# Patient Record
Sex: Female | Born: 1940 | Race: White | Hispanic: No | Marital: Married | Smoking: Former smoker
Health system: Southern US, Community
[De-identification: ages and names within clinical notes are randomized; demographics above are authoritative.]

## PROBLEM LIST (undated history)

## (undated) DIAGNOSIS — K219 Gastro-esophageal reflux disease without esophagitis: Secondary | ICD-10-CM

## (undated) DIAGNOSIS — F419 Anxiety disorder, unspecified: Secondary | ICD-10-CM

## (undated) DIAGNOSIS — F32A Depression, unspecified: Secondary | ICD-10-CM

## (undated) DIAGNOSIS — C55 Malignant neoplasm of uterus, part unspecified: Secondary | ICD-10-CM

## (undated) DIAGNOSIS — T7840XA Allergy, unspecified, initial encounter: Secondary | ICD-10-CM

## (undated) DIAGNOSIS — I350 Nonrheumatic aortic (valve) stenosis: Secondary | ICD-10-CM

## (undated) DIAGNOSIS — I10 Essential (primary) hypertension: Secondary | ICD-10-CM

## (undated) DIAGNOSIS — M75100 Unspecified rotator cuff tear or rupture of unspecified shoulder, not specified as traumatic: Secondary | ICD-10-CM

## (undated) DIAGNOSIS — N952 Postmenopausal atrophic vaginitis: Secondary | ICD-10-CM

## (undated) DIAGNOSIS — F329 Major depressive disorder, single episode, unspecified: Secondary | ICD-10-CM

## (undated) DIAGNOSIS — E785 Hyperlipidemia, unspecified: Secondary | ICD-10-CM

## (undated) DIAGNOSIS — Z9221 Personal history of antineoplastic chemotherapy: Secondary | ICD-10-CM

## (undated) DIAGNOSIS — I340 Nonrheumatic mitral (valve) insufficiency: Secondary | ICD-10-CM

## (undated) DIAGNOSIS — Z923 Personal history of irradiation: Secondary | ICD-10-CM

## (undated) DIAGNOSIS — R011 Cardiac murmur, unspecified: Secondary | ICD-10-CM

## (undated) DIAGNOSIS — R32 Unspecified urinary incontinence: Secondary | ICD-10-CM

## (undated) HISTORY — DX: Unspecified rotator cuff tear or rupture of unspecified shoulder, not specified as traumatic: M75.100

## (undated) HISTORY — DX: Anxiety disorder, unspecified: F41.9

## (undated) HISTORY — DX: Postmenopausal atrophic vaginitis: N95.2

## (undated) HISTORY — PX: TUBAL LIGATION: SHX77

## (undated) HISTORY — DX: Essential (primary) hypertension: I10

## (undated) HISTORY — DX: Unspecified urinary incontinence: R32

## (undated) HISTORY — DX: Allergy, unspecified, initial encounter: T78.40XA

## (undated) HISTORY — DX: Gastro-esophageal reflux disease without esophagitis: K21.9

## (undated) HISTORY — DX: Depression, unspecified: F32.A

## (undated) HISTORY — DX: Major depressive disorder, single episode, unspecified: F32.9

## (undated) HISTORY — DX: Hyperlipidemia, unspecified: E78.5

## (undated) NOTE — *Deleted (*Deleted)
[facility] Emergency Department Provider Note ____________________________________________   First MD Initiated Contact with Patient 10/28/20 1308     (approximate)  I have reviewed the triage vital signs and the nursing notes.   HISTORY  Chief Complaint Laceration  HPI Samantha Lang is a 41 y.o. female with history of *** presents to the emergency department for treatment and evaluation ***.         Past Medical History:  Diagnosis Date  . Allergy   . Anxiety   . Aortic valve stenosis, mild   . Depression    mild  . Diabetes mellitus age 30  . GERD (gastroesophageal reflux disease)   . Heart murmur   . Hemorrhoids   . Hyperlipidemia   . Hypertension 2003  . Incontinence    Female stress  . Mitral incompetence   . Personal history of chemotherapy    3 treatments  . Personal history of radiation therapy    5 treatments  . Postmenopausal atrophic vaginitis   . Uterine cancer [facility])    treated    Patient Active Problem List   Diagnosis Date Noted  . 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 LVH (left ventricular hypertrophy) due to hypertensive disease, without heart failure 04/11/2019  . Foot pain, right 10/10/2018  . Ganglion cyst of left foot 08/29/2018  . Xerosis of skin 08/29/2018  . Palpitations 08/08/2018  . Colon cancer screening   . Trochanteric bursitis of left hip 12/07/2016  . History of endometrial cancer 08/03/2016  . Edema leg 04/13/2016  . Type II diabetes mellitus with complication (HCC) 03/14/2016  . Shoulder pain, left 02/12/2016  . Acid reflux 10/02/2015  . 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 Hyperlipidemia associated with type 2 diabetes mellitus (HCC) 04/08/2015  . Aortic heart valve narrowing 03/26/2015  . Bilateral carotid artery stenosis 03/26/2015  . Anxiety   . Environmental and seasonal allergies   . Postmenopausal atrophic vaginitis   . Depression,  major, recurrent, moderate (HCC)   . 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 (HCC)   . Essential hypertension     Past Surgical History:  Procedure Laterality Date  . ABDOMINAL HYSTERECTOMY  07/2016  . BREAST BIOPSY Right 03/21/08   right, benign   . COLONOSCOPY WITH PROPOFOL N/A 09/22/2017   Procedure: COLONOSCOPY WITH PROPOFOL;  Surgeon: Midge Minium, MD;  Location: [facility];  Service: Gastroenterology;  Laterality: N/A;  diabetic  . EYE SURGERY  Oct 2009   for ptosis,  Dr. Shirlee Limerick, eyelid lift  . POLYPECTOMY  09/22/2017   Procedure: POLYPECTOMY;  Surgeon: Midge Minium, MD;  Location: [facility];  Service: Gastroenterology;;  . TUBAL LIGATION      Prior to Admission medications   Medication Sig Start Date End Date Taking? Authorizing Provider  allopurinol (ZYLOPRIM) 100 MG tablet TAKE 1 TABLET BY MOUTH EVERY DAY 03/06/20   Reubin Milan, MD  amLODipine (NORVASC) 5 MG tablet Take 1 tablet (5 mg total) by mouth 2 (two) times daily. 03/06/20   Reubin Milan, MD  aspirin 81 MG tablet Take 81 mg by mouth daily.    [provider]  atorvastatin (LIPITOR) 10 MG tablet Take 1 tablet by mouth daily. 07/12/15   [provider]  Calcium Carbonate-Vit D-Min (CALCIUM 1200 PO) Take by mouth.    [provider]  calcium-vitamin D (OSCAL WITH D) 500-200 MG-UNIT tablet Take 1 tablet by mouth.  [provider]  carvedilol (COREG) 3.125 MG tablet TAKE 1 TABLET(3.125 MG) BY MOUTH TWICE DAILY WITH MEALS 10/20/16   [provider]  colchicine 0.6 MG tablet Take 0.6 mg by mouth daily. PRN only, takes rarely    [provider]  diazepam (VALIUM) 5 MG tablet Take 0.5 tablets (2.5 mg total) by mouth every 12 (twelve) hours as needed (vertigo). 12/25/18   Reubin Milan, MD  JANUVIA 100 MG tablet TAKE 1 TABLET BY MOUTH EVERY DAY 08/16/20   Reubin Milan, MD  Multiple Vitamin (MULTIVITAMIN) capsule Take 1  capsule by mouth daily.    [provider]  pantoprazole (PROTONIX) 40 MG tablet TAKE 1 TABLET BY MOUTH EVERY DAY 03/06/20   Reubin Milan, MD  psyllium (METAMUCIL SMOOTH TEXTURE) 58.6 % powder Please use one does every other day. 10/17/19   Pasty Spillers, MD  telmisartan-hydrochlorothiazide (MICARDIS HCT) 80-25 MG tablet Take 1 tablet by mouth daily. 06/08/17   [provider]  venlafaxine XR (EFFEXOR-XR) 150 MG 24 hr capsule TAKE (1) CAPSULE BY MOUTH EVERY DAY 07/31/20   Reubin Milan, MD    Allergies Lipitor [atorvastatin calcium], Cephalexin, and Clarithromycin  Family History  Problem Relation Age of Onset  . Breast cancer Paternal Aunt   . Breast cancer Paternal Grandmother 9  . Cancer Father 88       lung  . Stroke Mother   . Hypertension Mother   . Cancer Brother        prostate  . Depression Brother   . Ovarian cancer Neg Hx   . Colon cancer Neg Hx   . Diabetes Neg Hx     Social History Social History   Tobacco Use  . Smoking status: Former Smoker    Packs/day: 0.25    Years: 20.00    Pack years: 5.00    Types: Cigarettes    Quit date: 09/25/1988    Years since quitting: 32.1  . Smokeless tobacco: Never Used  Vaping Use  . Vaping Use: Never used  Substance Use Topics  . Alcohol use: Yes    Alcohol/week: 6.0 standard drinks    Types: 2 Glasses of wine, 2 Cans of beer, 2 Shots of liquor per week  . Drug use: No    Review of Systems  Constitutional: No fever/chills Eyes: No visual changes. ENT: No sore throat. Cardiovascular: Denies chest pain. Respiratory: Denies shortness of breath. Gastrointestinal: No abdominal pain.  No nausea, no vomiting.  No diarrhea.  No constipation. Genitourinary: Negative for dysuria. Musculoskeletal: Negative for back pain. Skin: Negative for rash. Neurological: Negative for headaches, focal weakness or numbness. {**Psychiatric:  Endocrine:  Hematological/Lymphatic:  Allergic/Immunilogical:  **}  ____________________________________________   PHYSICAL EXAM:  VITAL SIGNS: ED Triage Vitals [10/28/20 1304]  Enc Vitals Group     BP (!) 166/60     Pulse Rate 68     Resp 18     Temp 98 F (36.7 C)     Temp Source Oral     SpO2 98 %     Weight 170 lb (77.1 kg)     Height 5\' 2"  (1.575 m)     Head Circumference      Peak Flow      Pain Score 5     Pain Loc      Pain Edu?      Excl. in GC?     Constitutional: Alert and oriented. Well appearing and in no acute distress.  Eyes: Conjunctivae are normal. PERRL. EOMI. Head: Atraumatic. Nose: No congestion/rhinnorhea. Mouth/Throat: Mucous membranes are moist.  Oropharynx non-erythematous. Neck: No stridor.   Hematological/Lymphatic/Immunilogical: No cervical lymphadenopathy. Cardiovascular: Normal rate, regular rhythm. Grossly normal heart sounds.  Good peripheral circulation. Respiratory: Normal respiratory effort.  No retractions. Lungs CTAB. Gastrointestinal: Soft and nontender. No distention. No abdominal bruits. No CVA tenderness. Genitourinary:  Musculoskeletal: No lower extremity tenderness nor edema.  No joint effusions. Neurologic:  Normal speech and language. No gross focal neurologic deficits are appreciated. No gait instability. Skin:  Skin is warm, dry and intact. No rash noted. Psychiatric: Mood and affect are normal. Speech and behavior are normal.  ____________________________________________   LABS (all labs ordered are listed, but only abnormal results are displayed)  Labs Reviewed - No data to display ____________________________________________  EKG  *** ____________________________________________  RADIOLOGY  ED MD interpretation:    *** I, Kem Boroughs, personally viewed and evaluated these images (plain radiographs) as part of my medical decision making, as well as reviewing the written report by the radiologist.  Official radiology report(s): CT Head Wo Contrast  Result Date:  10/28/2020 CLINICAL DATA:  Fall laceration to RIGHT side of forehead EXAM: CT HEAD WITHOUT CONTRAST CT CERVICAL SPINE WITHOUT CONTRAST TECHNIQUE: Multidetector CT imaging of the head and cervical spine was performed following the standard protocol without intravenous contrast. Multiplanar CT image reconstructions of the cervical spine were also generated. COMPARISON:  October 15, 2020 FINDINGS: CT HEAD FINDINGS Brain: No evidence of acute infarction, hemorrhage, hydrocephalus, extra-axial collection or mass lesion/mass effect. Vascular: No hyperdense vessel or unexpected calcification. Skull: Normal. Negative for fracture or focal lesion. Sinuses/Orbits: No acute finding. Other: RIGHT forehead laceration and subcutaneous hematoma. CT CERVICAL SPINE FINDINGS Alignment: Normal. Skull base and vertebrae: No acute fracture. No primary bone lesion or focal pathologic process. Soft tissues and spinal canal: No prevertebral fluid or swelling. No visible canal hematoma. Disc levels: Mild multilevel endplate proliferative changes. Bilateral facet arthropathy. Upper chest: LEFT apical scarring versus nodule measuring up to 4 mm (series 7, image 38. Other: None IMPRESSION: 1. No acute intracranial abnormality. 2. No fracture or static subluxation of the cervical spine. 3. LEFT apical nodule versus scarring measuring up to 4 mm. Recommend follow-up chest CT in 1 year to assess for stability. Electronically Signed   By: Meda Klinefelter MD   On: 10/28/2020 15:18   CT Cervical Spine Wo Contrast  Result Date: 10/28/2020 CLINICAL DATA:  Fall laceration to RIGHT side of forehead EXAM: CT HEAD WITHOUT CONTRAST CT CERVICAL SPINE WITHOUT CONTRAST TECHNIQUE: Multidetector CT imaging of the head and cervical spine was performed following the standard protocol without intravenous contrast. Multiplanar CT image reconstructions of the cervical spine were also generated. COMPARISON:  October 15, 2020 FINDINGS: CT HEAD FINDINGS Brain:  No evidence of acute infarction, hemorrhage, hydrocephalus, extra-axial collection or mass lesion/mass effect. Vascular: No hyperdense vessel or unexpected calcification. Skull: Normal. Negative for fracture or focal lesion. Sinuses/Orbits: No acute finding. Other: RIGHT forehead laceration and subcutaneous hematoma. CT CERVICAL SPINE FINDINGS Alignment: Normal. Skull base and vertebrae: No acute fracture. No primary bone lesion or focal pathologic process. Soft tissues and spinal canal: No prevertebral fluid or swelling. No visible canal hematoma. Disc levels: Mild multilevel endplate proliferative changes. Bilateral facet arthropathy. Upper chest: LEFT apical scarring versus nodule measuring up to 4 mm (series 7, image 38. Other: None IMPRESSION: 1. No acute intracranial abnormality. 2. No fracture or static subluxation of the cervical spine. 3. LEFT  apical nodule versus scarring measuring up to 4 mm. Recommend follow-up chest CT in 1 year to assess for stability. Electronically Signed   By: Meda Klinefelter MD   On: 10/28/2020 15:18   DG Humerus Right  Result Date: 10/28/2020 CLINICAL DATA:  Larey Seat, pain EXAM: RIGHT HUMERUS - 2+ VIEW COMPARISON:  None. FINDINGS: Frontal and lateral views of the right humerus demonstrates no acute fracture. Alignment of the right shoulder and elbow is anatomic. Mild hypertrophic change of the acromioclavicular joint. The soft tissues are unremarkable. IMPRESSION: 1. Mild osteoarthritis of the right shoulder. 2. No acute fracture. Electronically Signed   By: Sharlet Salina M.D.   On: 10/28/2020 15:29    ____________________________________________   PROCEDURES  Procedure(s) performed (including Critical Care):  Procedures  ____________________________________________   INITIAL IMPRESSION / ASSESSMENT AND PLAN     ***  DIFFERENTIAL DIAGNOSIS  ***  ED COURSE  ***    ___________________________________________   FINAL CLINICAL IMPRESSION(S) / ED  DIAGNOSES  Final diagnoses:  Minor head injury, initial encounter  Laceration of forehead, initial encounter  Right arm pain  Incidental pulmonary nodule, > 3mm and < 8mm     ED Discharge Orders    None       Samantha Lang was evaluated in Emergency Department on 10/28/2020 for the symptoms described in the history of present illness. She was evaluated in the context of the global COVID-19 pandemic, which necessitated consideration that the patient might be at risk for infection with the SARS-CoV-2 virus that causes COVID-19. Institutional protocols and algorithms that pertain to the evaluation of patients at risk for COVID-19 are in a state of rapid change based on information released by regulatory bodies including the CDC and federal and state organizations. These policies and algorithms were followed during the patient's care in the ED.   Note:  This document was prepared using Dragon voice recognition software and may include unintentional dictation errors.

---

## 2001-12-26 DIAGNOSIS — I10 Essential (primary) hypertension: Secondary | ICD-10-CM

## 2001-12-26 HISTORY — DX: Essential (primary) hypertension: I10

## 2004-12-26 LAB — HM COLONOSCOPY: HM Colonoscopy: NORMAL

## 2007-12-13 ENCOUNTER — Ambulatory Visit: Payer: Self-pay

## 2008-01-21 ENCOUNTER — Ambulatory Visit: Payer: Self-pay

## 2008-03-13 ENCOUNTER — Ambulatory Visit: Payer: Self-pay | Admitting: Surgery

## 2008-03-13 ENCOUNTER — Other Ambulatory Visit: Payer: Self-pay

## 2008-03-21 ENCOUNTER — Ambulatory Visit: Payer: Self-pay | Admitting: Surgery

## 2008-03-21 HISTORY — PX: BREAST BIOPSY: SHX20

## 2008-09-13 ENCOUNTER — Ambulatory Visit: Payer: Self-pay | Admitting: Family Medicine

## 2008-09-25 HISTORY — PX: EYE SURGERY: SHX253

## 2008-12-26 LAB — HM PAP SMEAR: HM Pap smear: NORMAL

## 2009-01-05 ENCOUNTER — Ambulatory Visit: Payer: Self-pay | Admitting: Internal Medicine

## 2009-02-01 ENCOUNTER — Ambulatory Visit: Payer: Self-pay | Admitting: Family Medicine

## 2009-04-15 ENCOUNTER — Ambulatory Visit: Payer: Self-pay | Admitting: Internal Medicine

## 2009-04-25 LAB — HM MAMMOGRAPHY: HM Mammogram: NORMAL

## 2010-03-26 ENCOUNTER — Ambulatory Visit: Payer: Self-pay | Admitting: Internal Medicine

## 2010-05-19 ENCOUNTER — Ambulatory Visit: Payer: Self-pay | Admitting: Internal Medicine

## 2010-05-25 ENCOUNTER — Ambulatory Visit: Payer: Self-pay | Admitting: Internal Medicine

## 2011-08-24 ENCOUNTER — Other Ambulatory Visit: Payer: Self-pay | Admitting: Internal Medicine

## 2011-08-24 MED ORDER — NEBIVOLOL HCL 10 MG PO TABS: 10.0000 mg | ORAL_TABLET | Freq: Every day | ORAL | Status: DC

## 2011-08-24 MED ORDER — SIMVASTATIN 40 MG PO TABS: 40.0000 mg | ORAL_TABLET | Freq: Every day | ORAL | Status: DC

## 2011-09-05 ENCOUNTER — Ambulatory Visit: Payer: Self-pay | Admitting: Internal Medicine

## 2011-09-13 ENCOUNTER — Ambulatory Visit: Payer: Self-pay | Admitting: Internal Medicine

## 2011-09-22 ENCOUNTER — Ambulatory Visit: Payer: Self-pay | Admitting: Internal Medicine

## 2011-09-26 ENCOUNTER — Ambulatory Visit (INDEPENDENT_AMBULATORY_CARE_PROVIDER_SITE_OTHER): Payer: PRIVATE HEALTH INSURANCE | Admitting: Internal Medicine

## 2011-09-26 ENCOUNTER — Encounter: Payer: Self-pay | Admitting: Internal Medicine

## 2011-09-26 ENCOUNTER — Ambulatory Visit: Payer: Self-pay | Admitting: Internal Medicine

## 2011-09-26 DIAGNOSIS — F3289 Other specified depressive episodes: Secondary | ICD-10-CM

## 2011-09-26 DIAGNOSIS — E118 Type 2 diabetes mellitus with unspecified complications: Secondary | ICD-10-CM

## 2011-09-26 DIAGNOSIS — F32A Depression, unspecified: Secondary | ICD-10-CM

## 2011-09-26 DIAGNOSIS — E785 Hyperlipidemia, unspecified: Secondary | ICD-10-CM

## 2011-09-26 DIAGNOSIS — E1129 Type 2 diabetes mellitus with other diabetic kidney complication: Secondary | ICD-10-CM | POA: Insufficient documentation

## 2011-09-26 DIAGNOSIS — M2550 Pain in unspecified joint: Secondary | ICD-10-CM

## 2011-09-26 DIAGNOSIS — I10 Essential (primary) hypertension: Secondary | ICD-10-CM

## 2011-09-26 DIAGNOSIS — Z79899 Other long term (current) drug therapy: Secondary | ICD-10-CM

## 2011-09-26 DIAGNOSIS — F329 Major depressive disorder, single episode, unspecified: Secondary | ICD-10-CM

## 2011-09-26 DIAGNOSIS — E119 Type 2 diabetes mellitus without complications: Secondary | ICD-10-CM

## 2011-09-26 LAB — COMPREHENSIVE METABOLIC PANEL
ALT: 29 U/L (ref 0–35)
Alkaline Phosphatase: 72 U/L (ref 39–117)
CO2: 28 mEq/L (ref 19–32)
Creatinine, Ser: 0.8 mg/dL (ref 0.4–1.2)
GFR: 81.47 mL/min (ref >60.00)
Total Bilirubin: 0.7 mg/dL (ref 0.3–1.2)

## 2011-09-26 LAB — SEDIMENTATION RATE: Sed Rate: 25 mm/hr — ABNORMAL HIGH (ref 0–22)

## 2011-09-26 LAB — URIC ACID: Uric Acid, Serum: 7.3 mg/dL — ABNORMAL HIGH (ref 2.4–7.0)

## 2011-09-26 LAB — HEMOGLOBIN A1C: Hgb A1c MFr Bld: 6.6 % — ABNORMAL HIGH (ref 4.6–6.5)

## 2011-09-26 MED ORDER — NABUMETONE 750 MG PO TABS: 750.0000 mg | ORAL_TABLET | Freq: Two times a day (BID) | ORAL | Status: AC

## 2011-09-26 MED ORDER — HYDROCORTISONE 2.5 % RE CREA: 1.0000 "application " | TOPICAL_CREAM | Freq: Two times a day (BID) | RECTAL | Status: DC | PRN

## 2011-09-26 NOTE — Patient Instructions (Signed)
The female psychiatrists tha t I recommend are:  Emerson Monte in [employer] in  [place] Artie [place] in [place]  We are trying nabumetone (relafen) as an antiniflammtory for your multiple joint problems and apply ice for 15 minutes twice daily for that middle finger.  If the medication doesn't help with the pain,  Call us back in 1 -2 weeks

## 2011-09-26 NOTE — Progress Notes (Signed)
  Subjective:    Patient ID: Samantha Lang, female    DOB: 08-Aug-1941, 70 y.o.   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  She has multiple joint pain complaints today, is unhappy both at work and at home, which is aggravated by stress of economic situation. Her most obvious joint issue is notable for her right middle finger which is diffusely swollen. No history of trauma, recent use of shears or yardwork. Multiple prior hadn surgeries for trigger finger release.   Past Medical History  Diagnosis Date  . Hemorrhoids   . Incontinence     Female stress  . GERD (gastroesophageal reflux disease)   . Hyperlipidemia   . Hypertension 2003  . Diabetes mellitus age 73  . Anxiety   . Allergy   . Postmenopausal atrophic vaginitis   . Depression     mild    Current Outpatient Prescriptions on File Prior to Visit  Medication Sig Dispense Refill  . nebivolol (BYSTOLIC) 10 MG tablet Take 1 tablet (10 mg total) by mouth daily.  30 tablet  3  . simvastatin (ZOCOR) 40 MG tablet Take 1 tablet (40 mg total) by mouth at bedtime.  30 tablet  3    Review of Systems  Constitutional: Negative for fever, chills and unexpected weight change.  HENT: Negative for hearing loss, ear pain, nosebleeds, congestion, sore throat, facial swelling, rhinorrhea, sneezing, mouth sores, trouble swallowing, neck pain, neck stiffness, voice change, postnasal drip, sinus pressure, tinnitus and ear discharge.   Eyes: Negative for pain, discharge, redness and visual disturbance.  Respiratory: Negative for cough, chest tightness, shortness of breath, wheezing and stridor.   Cardiovascular: Negative for chest pain, palpitations and leg swelling.  Musculoskeletal: Positive for myalgias and arthralgias.  Skin: Negative for color change and rash.  Neurological: Negative for dizziness, weakness, light-headedness and headaches.    Hematological: Negative for adenopathy.  Psychiatric/Behavioral: Positive for sleep disturbance and dysphoric mood. Negative for suicidal ideas. The patient is nervous/anxious.        Objective:   Physical Exam  Musculoskeletal: She exhibits edema.       Arms:         Assessment & Plan:

## 2011-09-27 ENCOUNTER — Encounter: Payer: Self-pay | Admitting: Internal Medicine

## 2011-09-27 DIAGNOSIS — N952 Postmenopausal atrophic vaginitis: Secondary | ICD-10-CM | POA: Insufficient documentation

## 2011-09-27 DIAGNOSIS — F419 Anxiety disorder, unspecified: Secondary | ICD-10-CM | POA: Insufficient documentation

## 2011-09-27 DIAGNOSIS — J3089 Other allergic rhinitis: Secondary | ICD-10-CM | POA: Insufficient documentation

## 2011-09-27 DIAGNOSIS — F331 Major depressive disorder, recurrent, moderate: Secondary | ICD-10-CM | POA: Insufficient documentation

## 2011-09-27 LAB — RHEUMATOID FACTOR: Rhuematoid fact SerPl-aCnc: <10 IU/mL (ref <=14)

## 2011-09-27 LAB — C-REACTIVE PROTEIN: CRP: 0.88 mg/dL — ABNORMAL HIGH (ref <0.60)

## 2011-09-27 NOTE — Assessment & Plan Note (Signed)
Well controlled historically on metformin alone. Repeat labs due.  She takes simvastatin for goal LDL of 70.  Reviewed diet and exercise recommendations.

## 2011-09-27 NOTE — Assessment & Plan Note (Signed)
Her pain complaints seem to be amplified today by her worsening anxiety and depression. She is reluctant to change her dose of effexor or add anything to it.  Spent 10 minutes discussing the stressor in her life which include the upcoming holiday (Christmas)  I have recommended that she resume talk therapy with her former therapist as this has helped her in the past.

## 2011-09-27 NOTE — Assessment & Plan Note (Signed)
With multiple joints involved,  I will rule out  inflammatory and autoimmune etiologies with serologies. Discussed trial of Cymbalta but she does not want to change Effexor.

## 2011-09-28 ENCOUNTER — Telehealth: Payer: Self-pay | Admitting: Internal Medicine

## 2011-09-28 NOTE — Telephone Encounter (Signed)
Pt would like to get lab results for labs she had done on 09/26/11

## 2011-09-29 NOTE — Telephone Encounter (Signed)
Patient notified of lab results

## 2011-10-17 ENCOUNTER — Telehealth: Payer: Self-pay | Admitting: Internal Medicine

## 2011-10-17 DIAGNOSIS — M7989 Other specified soft tissue disorders: Secondary | ICD-10-CM

## 2011-10-17 NOTE — Telephone Encounter (Signed)
I don't knwo what is causing that finger to remain so swollen. We should x ray it to see if there are signs of a fracture .  I will put an order in [facility],

## 2011-10-17 NOTE — Telephone Encounter (Signed)
Pt called  meds are not helping her hands.  Middle finger on right hand is still swollen and painful, but her feet are feeling better not hurting. Pt wanted to know what the next steps for her hands [facility]

## 2011-10-20 ENCOUNTER — Telehealth: Payer: Self-pay | Admitting: *Deleted

## 2011-10-20 NOTE — Telephone Encounter (Signed)
Message copied by Vernie Murders on Thu Oct 20, 2011  4:10 PM ------      Message from: Duncan Dull      Created: Tue Oct 18, 2011  9:06 AM      Regarding: Kisiel labs       Just received labs from employee health dated 10/12/10,  Note the year.  Is this a mistake or are these last years labs.  (i.e., did she go habe labs done last week on the 18th)

## 2011-10-20 NOTE — Telephone Encounter (Signed)
Samantha Lang spoke with pt 10/24

## 2011-10-21 NOTE — Telephone Encounter (Signed)
Left message asking patient to return my call.

## 2011-10-24 NOTE — Telephone Encounter (Signed)
Patient stated she must have sent you the wrong labs.  The date was correct on the labs that were sent.  She recently had a new set of labs drawn and will send those as well.

## 2011-11-10 ENCOUNTER — Telehealth: Payer: Self-pay | Admitting: Internal Medicine

## 2011-11-10 DIAGNOSIS — I10 Essential (primary) hypertension: Secondary | ICD-10-CM

## 2011-11-10 MED ORDER — NEBIVOLOL HCL 20 MG PO TABS: 1.0000 | ORAL_TABLET | Freq: Every day | ORAL | Status: DC

## 2011-11-10 NOTE — Telephone Encounter (Signed)
Sent 20 mg rx to [facility]

## 2011-11-10 NOTE — Telephone Encounter (Signed)
Rx phoned to pharmacy.  

## 2011-11-10 NOTE — Telephone Encounter (Signed)
[phone] Pt called Dr Darrick Huntsman said to increase her bystolic at last visit.  Pt  Takes 2 instead of one  She was taking 10mg  increased to 20mg   Needs another rx [facility] Please advise when call in

## 2011-11-10 NOTE — Telephone Encounter (Signed)
Patient notified of rx

## 2011-11-23 ENCOUNTER — Other Ambulatory Visit: Payer: Self-pay | Admitting: Internal Medicine

## 2011-11-23 DIAGNOSIS — F419 Anxiety disorder, unspecified: Secondary | ICD-10-CM

## 2011-11-23 MED ORDER — DIAZEPAM 5 MG PO TABS: 5.0000 mg | ORAL_TABLET | Freq: Every evening | ORAL | Status: DC | PRN

## 2011-11-23 NOTE — Telephone Encounter (Signed)
Ok to refill the valium as is.

## 2011-11-24 ENCOUNTER — Other Ambulatory Visit: Payer: Self-pay | Admitting: Internal Medicine

## 2011-11-25 MED ORDER — SIMVASTATIN 40 MG PO TABS: 40.0000 mg | ORAL_TABLET | Freq: Every day | ORAL | Status: AC

## 2012-02-03 ENCOUNTER — Other Ambulatory Visit: Payer: Self-pay | Admitting: *Deleted

## 2012-02-03 NOTE — Telephone Encounter (Signed)
Faxed request from [facility], last filled 10/31/11.

## 2012-02-06 ENCOUNTER — Other Ambulatory Visit: Payer: Self-pay | Admitting: *Deleted

## 2012-02-06 MED ORDER — VENLAFAXINE HCL ER 150 MG PO CP24: 150.0000 mg | ORAL_CAPSULE | Freq: Every day | ORAL | Status: DC

## 2012-02-06 NOTE — Telephone Encounter (Signed)
Faxed request from [facility], last filled 10/31/11. 

## 2012-02-10 ENCOUNTER — Other Ambulatory Visit: Payer: PRIVATE HEALTH INSURANCE

## 2012-02-14 ENCOUNTER — Telehealth: Payer: Self-pay | Admitting: Internal Medicine

## 2012-02-14 NOTE — Telephone Encounter (Signed)
Pt called to let you know that she was changing md.  She wanted to go to someone closer to her home with gas prices so high  She will be going to dr berglin in [place]

## 2012-02-15 ENCOUNTER — Ambulatory Visit: Payer: Self-pay | Admitting: Ophthalmology

## 2012-02-17 ENCOUNTER — Ambulatory Visit: Payer: PRIVATE HEALTH INSURANCE | Admitting: Internal Medicine

## 2012-03-28 ENCOUNTER — Encounter: Payer: Self-pay | Admitting: Internal Medicine

## 2012-06-04 ENCOUNTER — Other Ambulatory Visit: Payer: Self-pay | Admitting: Internal Medicine

## 2012-06-04 DIAGNOSIS — I10 Essential (primary) hypertension: Secondary | ICD-10-CM

## 2012-06-04 MED ORDER — NEBIVOLOL HCL 20 MG PO TABS: 1.0000 | ORAL_TABLET | Freq: Every day | ORAL | Status: DC

## 2014-06-04 ENCOUNTER — Ambulatory Visit: Payer: Self-pay | Admitting: Internal Medicine

## 2014-07-11 ENCOUNTER — Ambulatory Visit: Payer: Self-pay | Admitting: Physician Assistant

## 2015-03-25 ENCOUNTER — Ambulatory Visit: Admit: 2015-03-25 | Disposition: A | Discharge status: Home / Self Care | Payer: Self-pay | Admitting: Ophthalmology

## 2015-03-26 DIAGNOSIS — I350 Nonrheumatic aortic (valve) stenosis: Secondary | ICD-10-CM | POA: Insufficient documentation

## 2015-03-26 DIAGNOSIS — I6523 Occlusion and stenosis of bilateral carotid arteries: Secondary | ICD-10-CM | POA: Insufficient documentation

## 2015-03-26 DIAGNOSIS — I358 Other nonrheumatic aortic valve disorders: Secondary | ICD-10-CM | POA: Insufficient documentation

## 2015-04-08 DIAGNOSIS — E785 Hyperlipidemia, unspecified: Secondary | ICD-10-CM

## 2015-04-08 DIAGNOSIS — E1169 Type 2 diabetes mellitus with other specified complication: Secondary | ICD-10-CM | POA: Insufficient documentation

## 2015-04-09 DIAGNOSIS — I071 Rheumatic tricuspid insufficiency: Secondary | ICD-10-CM | POA: Insufficient documentation

## 2015-04-09 DIAGNOSIS — I340 Nonrheumatic mitral (valve) insufficiency: Secondary | ICD-10-CM | POA: Insufficient documentation

## 2015-07-17 ENCOUNTER — Other Ambulatory Visit: Payer: Self-pay | Admitting: Internal Medicine

## 2015-09-08 ENCOUNTER — Ambulatory Visit: Payer: Self-pay | Admitting: Internal Medicine

## 2015-10-02 ENCOUNTER — Other Ambulatory Visit: Payer: Self-pay | Admitting: Internal Medicine

## 2015-10-02 ENCOUNTER — Encounter: Payer: Self-pay | Admitting: Internal Medicine

## 2015-10-02 ENCOUNTER — Ambulatory Visit (INDEPENDENT_AMBULATORY_CARE_PROVIDER_SITE_OTHER): Payer: Medicare Other | Admitting: Internal Medicine

## 2015-10-02 VITALS — BP 148/60 | HR 60 | Ht 62.5 in | Wt 184.4 lb

## 2015-10-02 DIAGNOSIS — I10 Essential (primary) hypertension: Secondary | ICD-10-CM | POA: Diagnosis not present

## 2015-10-02 DIAGNOSIS — Z0000 Encounter for general adult medical examination without abnormal findings: Secondary | ICD-10-CM | POA: Diagnosis not present

## 2015-10-02 DIAGNOSIS — F329 Major depressive disorder, single episode, unspecified: Secondary | ICD-10-CM | POA: Diagnosis not present

## 2015-10-02 DIAGNOSIS — E1169 Type 2 diabetes mellitus with other specified complication: Secondary | ICD-10-CM

## 2015-10-02 DIAGNOSIS — Z1211 Encounter for screening for malignant neoplasm of colon: Secondary | ICD-10-CM

## 2015-10-02 DIAGNOSIS — E119 Type 2 diabetes mellitus without complications: Secondary | ICD-10-CM | POA: Diagnosis not present

## 2015-10-02 DIAGNOSIS — I350 Nonrheumatic aortic (valve) stenosis: Secondary | ICD-10-CM

## 2015-10-02 DIAGNOSIS — K219 Gastro-esophageal reflux disease without esophagitis: Secondary | ICD-10-CM | POA: Insufficient documentation

## 2015-10-02 DIAGNOSIS — E785 Hyperlipidemia, unspecified: Secondary | ICD-10-CM | POA: Diagnosis not present

## 2015-10-02 DIAGNOSIS — Z23 Encounter for immunization: Secondary | ICD-10-CM | POA: Diagnosis not present

## 2015-10-02 DIAGNOSIS — Z1239 Encounter for other screening for malignant neoplasm of breast: Secondary | ICD-10-CM | POA: Diagnosis not present

## 2015-10-02 DIAGNOSIS — F32A Depression, unspecified: Secondary | ICD-10-CM

## 2015-10-02 LAB — POCT URINALYSIS DIPSTICK
BILIRUBIN UA: NEGATIVE
Glucose, UA: NEGATIVE
KETONES UA: NEGATIVE
LEUKOCYTES UA: NEGATIVE
NITRITE UA: NEGATIVE
PH UA: 5
PROTEIN UA: NEGATIVE
RBC UA: NEGATIVE
Spec Grav, UA: 1.015
Urobilinogen, UA: 0.2

## 2015-10-02 NOTE — Progress Notes (Signed)
Patient: Samantha Lang, Female    DOB: 06-14-1941, 74 y.o.   MRN: [id number] Visit Date: 10/02/2015  Today's Provider: Halina Maidens, MD   Chief Complaint  Patient presents with  . Medicare Wellness   Subjective:    Annual wellness visit Samantha Lang is a 74 y.o. female who presents today for her Subsequent Annual Wellness Visit. She feels well. She reports exercising some - walking the dogs. She reports she is sleeping fairly well.   ----------------------------------------------------------- HPI  Hypertension -Patient has good blood pressure control on several medications. Blood pressures normally run in the 140/80 range. She was recently seen by cardiology for mild aortic stenosis as well as TR and MR. Her symptoms been stable without increased shortness of breath on exertion or orthopnea.  Diabetes - patient is on Januvia oral therapy. Metformin was not tolerated due to gastric distress. Patient does not check her blood sugars but she feels as if her blood sugars are stable. Sometimes she may have some mild hypoglycemic symptoms if she skips a meal. She would like to begin checking her blood sugars and will find out what meter is covered.  Depression - patient is doing well on Effexor. She's had no recent change in her mood or sleep patterns. She was a little stressed trying to find a part-time job but decided to relax about that. She denies any suicidal or homicidal thoughts.  Hyperlipidemia - patient. She was intolerant to higher dose of Lipitor. Cardiology recommended that she begin therapy that she is now on 10 mg. She's tolerating it well without myalgias or abdominal pains. She's not had her lipids checked since starting therapy.  Reflux - reflexes chronic and stable. She is maintained on PPI with good symptom relief. No change in bowel habits heartburn or trouble swallowing..  Review of Systems  Constitutional: Negative for fever, chills and fatigue.  HENT: Negative for  hearing loss, sinus pressure and trouble swallowing.   Eyes: Negative for visual disturbance.  Respiratory: Negative for cough, choking, shortness of breath and wheezing.   Cardiovascular: Negative for chest pain, palpitations and leg swelling.  Gastrointestinal: Positive for abdominal distention. Negative for nausea, abdominal pain, constipation and blood in stool.  Endocrine: Negative for polydipsia and polyuria.  Genitourinary: Positive for dyspareunia. Negative for dysuria, hematuria, vaginal bleeding and vaginal discharge.  Musculoskeletal: Negative for joint swelling, gait problem, neck pain and neck stiffness.  Skin: Negative for rash and wound.       Dry skin all over with flaking and itching  Neurological: Negative for light-headedness, numbness and headaches.  Hematological: Negative for adenopathy. Does not bruise/bleed easily.  Psychiatric/Behavioral: Negative for confusion, sleep disturbance and dysphoric mood. The patient is not nervous/anxious.     Social History   Social History  . Marital Status: Married    Spouse Name: N/A  . Number of Children: N/A  . Years of Education: N/A   Occupational History  . Not on file.   Social History Main Topics  . Smoking status: Former Smoker    Quit date: 09/25/1988  . Smokeless tobacco: Never Used  . Alcohol Use: 0.0 oz/week    0 Standard drinks or equivalent per week  . Drug Use: No  . Sexual Activity: Not on file   Other Topics Concern  . Not on file   Social History Narrative    Patient Active Problem List   Diagnosis Date Noted  . Polyarthralgia 09/27/2011  . Anxiety   . Allergy   .  Postmenopausal atrophic vaginitis   . Depression   . 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 Hypertension     Past Surgical History  Procedure Laterality Date  . Breast biopsy  2009    right, benign   . Eye surgery  Oct 2009    for ptosis,  Dr. Rosaria Ferries, eyelid lift    Her family history  includes BRCA 1/2 in her paternal aunt and paternal grandmother; Cancer (age of onset: 58) in her father.    Previous Medications   AMLODIPINE (NORVASC) 5 MG TABLET    Take 5 mg by mouth daily.     ATORVASTATIN (LIPITOR) 10 MG TABLET    Take 1 tablet by mouth daily.   BYSTOLIC 10 MG TABLET    Take 1 tablet by mouth daily.   CALCIUM CARBONATE (OS-CAL) 600 MG TABS    Take 600 mg by mouth 2 (two) times daily with a meal.     CHOLECALCIFEROL (VITAMIN D) 1000 UNITS TABLET    Take 1,000 Units by mouth 2 (two) times daily.     CO-ENZYME Q-10 30 MG CAPSULE    Take 30 mg by mouth 3 (three) times daily.     DIAZEPAM (VALIUM) 5 MG TABLET    Take 1 tablet (5 mg total) by mouth at bedtime as needed.   ESTROGENS, CONJUGATED, (PREMARIN) 0.625 MG TABLET    Take 0.625 mg by mouth daily. Take daily for 21 days then do not take for 7 days.    FISH OIL-OMEGA-3 FATTY ACIDS 1000 MG CAPSULE    Take 2 g by mouth daily.     HYDROCORTISONE (ANUSOL-HC) 2.5 % RECTAL CREAM    Place 1 application rectally 2 (two) times daily as needed for hemorrhoids.   HYDROCORTISONE 0.5 % CREAM    Apply topically as needed.     LOSARTAN-HYDROCHLOROTHIAZIDE (HYZAAR) 100-25 MG PER TABLET    Take 1 tablet by mouth daily.     MAGNESIUM OXIDE (MAG-OX) 400 MG TABLET    Take 400 mg by mouth daily.     METFORMIN (FORTAMET) 1000 MG (OSM) 24 HR TABLET    Take 1,000 mg by mouth daily with breakfast.     MULTIPLE VITAMIN (MULTIVITAMIN) TABLET    Take 1 tablet by mouth daily.     PANTOPRAZOLE (PROTONIX) 40 MG TABLET    Take 40 mg by mouth daily.     SITAGLIPTIN (JANUVIA) 100 MG TABLET    Take 1 tablet by mouth daily at 2 PM daily at 2 PM.   VENLAFAXINE XR (EFFEXOR-XR) 150 MG 24 HR CAPSULE    TAKE ONE CAPSULE BY MOUTH EVERY DAY    Patient Care Team: Glean Hess, MD as PCP - General (Family Medicine)     Objective:   Vitals: BP 148/60 mmHg  Pulse 60  Ht 5' 2.5" (1.588 m)  Wt 184 lb 6.4 oz (83.643 kg)  BMI 33.17 kg/m2  Physical Exam   Constitutional: She is oriented to person, place, and time. She appears well-developed and well-nourished. No distress.  HENT:  Head: Normocephalic and atraumatic.  Right Ear: Tympanic membrane and ear canal normal.  Left Ear: Tympanic membrane and ear canal normal.  Nose: Right sinus exhibits no maxillary sinus tenderness. Left sinus exhibits no maxillary sinus tenderness.  Mouth/Throat: Uvula is midline and oropharynx is clear and moist.  Eyes: Conjunctivae and EOM are normal. Right eye exhibits no discharge. Left eye exhibits no discharge. No scleral icterus.  Neck: Normal range of motion. Neck supple. Carotid bruit is not present. No erythema present. No thyromegaly present.  Cardiovascular: Normal rate, regular rhythm, normal heart sounds and normal pulses.   Pulmonary/Chest: Effort normal and breath sounds normal. No respiratory distress. She has no wheezes. Right breast exhibits no mass, no nipple discharge, no skin change and no tenderness. Left breast exhibits no mass, no nipple discharge, no skin change and no tenderness.  Abdominal: Soft. Bowel sounds are normal. There is no hepatosplenomegaly. There is no tenderness. There is no CVA tenderness.  Musculoskeletal: Normal range of motion.  Lymphadenopathy:    She has no cervical adenopathy.    She has no axillary adenopathy.  Neurological: She is alert and oriented to person, place, and time. She has normal reflexes. No cranial nerve deficit or sensory deficit.  Skin: Skin is warm, dry and intact. No rash noted.  Psychiatric: She has a normal mood and affect. Her speech is normal and behavior is normal. Thought content normal.  Nursing note and vitals reviewed.   Activities of Daily Living In your present state of health, do you have any difficulty performing the following activities: 10/02/2015  Hearing? N  Vision? N  Difficulty concentrating or making decisions? N  Walking or climbing stairs? N  Dressing or bathing? N  Doing  errands, shopping? N    Fall Risk Assessment Fall Risk  10/02/2015  Falls in the past year? No     Patient reports there are safety devices in place in shower at home.   Depression Screen PHQ 2/9 Scores 10/02/2015  PHQ - 2 Score 0    Cognitive Testing - 6-CIT   Correct? Score   What year is it? yes 0 Yes = 0    No = 4  What month is it? yes 0 Yes = 0    No = 3  Remember:     [address]ia [address], [place], [place]     What time is it? yes 0 Yes = 0    No = 3  Count backwards from 20 to 1 yes 0 Correct = 0    1 error = 2   More than 1 error = 4  Say the months of the year in reverse. yes 0 Correct = 0    1 error = 2   More than 1 error = 4  What address did I ask you to remember? yes 0 Correct = 0  1 error = 2    2 error = 4    3 error = 6    4 error = 8    All wrong = 10       TOTAL SCORE  28/28   Interpretation:  Normal  Normal (0-7) Abnormal (8-28)        Assessment & Plan:     Annual Wellness Visit  Reviewed patient's Family Medical History Reviewed and updated list of patient's medical providers Assessment of cognitive impairment was done Assessed patient's functional ability Established a written schedule for health screening [place] Completed and Reviewed  Exercise Activities and Dietary recommendations Goals    None      There is no immunization history for the selected administration types on file for this patient.  Health Maintenance  Topic Date Due  . FOOT EXAM  11/12/1952  . OPHTHALMOLOGY EXAM  11/12/1952  . URINE MICROALBUMIN  11/12/1952  . TETANUS/TDAP  11/12/1961  . ZOSTAVAX  11/12/2002  . DEXA  SCAN  11/13/2007  . PNA vac Low Risk Adult (1 of 2 - PCV13) 11/13/2007  . MAMMOGRAM  04/26/2011  . HEMOGLOBIN A1C  03/26/2012  . COLONOSCOPY  12/26/2014  . INFLUENZA VACCINE  07/27/2015     Discussed health benefits of physical activity, and encouraged her to engage in regular exercise appropriate for her age and condition.     ------------------------------------------------------------------------------------------------------------  1. Medicare annual wellness visit, subsequent Medicare annual wellness measures satisfied - POCT urinalysis dipstick  2. Flu vaccine need - Flu Vaccine QUAD 36+ mos PF IM (Fluarix & Fluzone Quad PF)  3. Essential hypertension Controlled on current regimen - CBC with Differential/Platelet - Comprehensive metabolic panel  4. Well controlled diabetes mellitus ([place]) Continue Januvia alone Patient will call back with covered glucometer for prescription - Hemoglobin A1c - TSH - Microalbumin / creatinine urine ratio  5. Depression Stable on current medication  6. Combined fat and carbohydrate induced hyperlipemia Now on statin therapy and tolerating it well - Lipid panel  7. Gastroesophageal reflux disease, esophagitis presence not specified Continue daily PPI  8. Aortic heart valve narrowing Stable by recent echo  follow-up annually with cardiology  9. Breast cancer screening Continue monthly self exams - MM DIGITAL SCREENING BILATERAL; Future  10. Colon cancer screening Due for a 10 year follow-up - Ambulatory referral to Gastroenterology   Halina Maidens, MD [facility]  10/02/2015

## 2015-10-02 NOTE — Patient Instructions (Signed)
Health Maintenance  Topic Date Due  . FOOT EXAM  11/12/1952  . OPHTHALMOLOGY EXAM  11/12/1952  . URINE MICROALBUMIN  11/12/1952  . TETANUS/TDAP  11/12/1961  . ZOSTAVAX  11/12/2002  . DEXA SCAN  11/13/2007  . PNA vac Low Risk Adult (1 of 2 - PCV13) 11/13/2007  . MAMMOGRAM  04/26/2011  . HEMOGLOBIN A1C  03/26/2012  . COLONOSCOPY  12/26/2014  . INFLUENZA VACCINE  07/27/2015    USE AQUA-GLYCOLIC LOTION OR LAC-HYTRIN LOTION FOR DRY SKIN    Breast Self-Awareness Practicing breast self-awareness may pick up problems early, prevent significant medical complications, and possibly save your life. By practicing breast self-awareness, you can become familiar with how your breasts look and feel and if your breasts are changing. This allows you to notice changes early. It can also offer you some reassurance that your breast health is good. One way to learn what is normal for your breasts and whether your breasts are changing is to do a breast self-exam. If you find a lump or something that was not present in the past, it is best to contact your caregiver right away. Other findings that should be evaluated by your caregiver include nipple discharge, especially if it is bloody; skin changes or reddening; areas where the skin seems to be pulled in (retracted); or new lumps and bumps. Breast pain is seldom associated with cancer (malignancy), but should also be evaluated by a caregiver. HOW TO PERFORM A BREAST SELF-EXAM The best time to examine your breasts is 5-7 days after your menstrual period is over. During menstruation, the breasts are lumpier, and it may be more difficult to pick up changes. If you do not menstruate, have reached menopause, or had your uterus removed (hysterectomy), you should examine your breasts at regular intervals, such as monthly. If you are breastfeeding, examine your breasts after a feeding or after using a breast pump. Breast implants do not decrease the risk for lumps or tumors,  so continue to perform breast self-exams as recommended. Talk to your caregiver about how to determine the difference between the implant and breast tissue. Also, talk about the amount of pressure you should use during the exam. Over time, you will become more familiar with the variations of your breasts and more comfortable with the exam. A breast self-exam requires you to remove all your clothes above the waist. 1. Look at your breasts and nipples. Stand in front of a mirror in a room with good lighting. With your hands on your hips, push your hands firmly downward. Look for a difference in shape, contour, and size from one breast to the other (asymmetry). Asymmetry includes puckers, dips, or bumps. Also, look for skin changes, such as reddened or scaly areas on the breasts. Look for nipple changes, such as discharge, dimpling, repositioning, or redness. 2. Carefully feel your breasts. This is best done either in the shower or tub while using soapy water or when flat on your back. Place the arm (on the side of the breast you are examining) above your head. Use the pads (not the fingertips) of your three middle fingers on your opposite hand to feel your breasts. Start in the underarm area and use  inch (2 cm) overlapping circles to feel your breast. Use 3 different levels of pressure (light, medium, and firm pressure) at each circle before moving to the next circle. The light pressure is needed to feel the tissue closest to the skin. The medium pressure will help to feel  breast tissue a little deeper, while the firm pressure is needed to feel the tissue close to the ribs. Continue the overlapping circles, moving downward over the breast until you feel your ribs below your breast. Then, move one finger-width towards the center of the body. Continue to use the  inch (2 cm) overlapping circles to feel your breast as you move slowly up toward the collar bone (clavicle) near the base of the neck. Continue the up  and down exam using all 3 pressures until you reach the middle of the chest. Do this with each breast, carefully feeling for lumps or changes. 3.  Keep a written record with breast changes or normal findings for each breast. By writing this information down, you do not need to depend only on memory for size, tenderness, or location. Write down where you are in your menstrual cycle, if you are still menstruating. Breast tissue can have some lumps or thick tissue. However, see your caregiver if you find anything that concerns you.  SEEK MEDICAL CARE IF:  You see a change in shape, contour, or size of your breasts or nipples.   You see skin changes, such as reddened or scaly areas on the breasts or nipples.   You have an unusual discharge from your nipples.   You feel a new lump or unusually thick areas.    This information is not intended to replace advice given to you by your health care provider. Make sure you discuss any questions you have with your health care provider.   Document Released: 12/12/2005 Document Revised: 11/28/2012 Document Reviewed: 03/28/2012 Elsevier Interactive Patient Education [employer].

## 2015-10-03 ENCOUNTER — Encounter: Payer: Self-pay | Admitting: Internal Medicine

## 2015-10-03 LAB — COMPREHENSIVE METABOLIC PANEL
ALBUMIN: 4.3 g/dL (ref 3.5–4.8)
ALT: 23 IU/L (ref 0–32)
AST: 19 IU/L (ref 0–40)
Albumin/Globulin Ratio: 1.7 (ref 1.1–2.5)
Alkaline Phosphatase: 75 IU/L (ref 39–117)
BUN / CREAT RATIO: 23 (ref 11–26)
BUN: 18 mg/dL (ref 8–27)
Bilirubin Total: 0.6 mg/dL (ref 0.0–1.2)
CALCIUM: 9.3 mg/dL (ref 8.7–10.3)
CO2: 28 mmol/L (ref 18–29)
CREATININE: 0.77 mg/dL (ref 0.57–1.00)
Chloride: 94 mmol/L — ABNORMAL LOW (ref 97–108)
GFR calc Af Amer: 89 mL/min/{1.73_m2} (ref >59)
GFR, EST NON AFRICAN AMERICAN: 77 mL/min/{1.73_m2} (ref >59)
GLOBULIN, TOTAL: 2.5 g/dL (ref 1.5–4.5)
Glucose: 159 mg/dL — ABNORMAL HIGH (ref 65–99)
Potassium: 4 mmol/L (ref 3.5–5.2)
SODIUM: 138 mmol/L (ref 134–144)
Total Protein: 6.8 g/dL (ref 6.0–8.5)

## 2015-10-03 LAB — CBC WITH DIFFERENTIAL/PLATELET
Basophils Absolute: 0 10*3/uL (ref 0.0–0.2)
Basos: 0 %
EOS (ABSOLUTE): 0.2 10*3/uL (ref 0.0–0.4)
EOS: 3 %
HEMATOCRIT: 36.8 % (ref 34.0–46.6)
HEMOGLOBIN: 12.4 g/dL (ref 11.1–15.9)
IMMATURE GRANULOCYTES: 0 %
Immature Grans (Abs): 0 10*3/uL (ref 0.0–0.1)
Lymphocytes Absolute: 2.2 10*3/uL (ref 0.7–3.1)
Lymphs: 30 %
MCH: 30.4 pg (ref 26.6–33.0)
MCHC: 33.7 g/dL (ref 31.5–35.7)
MCV: 90 fL (ref 79–97)
MONOCYTES: 7 %
Monocytes Absolute: 0.5 10*3/uL (ref 0.1–0.9)
NEUTROS PCT: 60 %
Neutrophils Absolute: 4.2 10*3/uL (ref 1.4–7.0)
Platelets: 229 10*3/uL (ref 150–379)
RBC: 4.08 x10E6/uL (ref 3.77–5.28)
RDW: 13.7 % (ref 12.3–15.4)
WBC: 7.1 10*3/uL (ref 3.4–10.8)

## 2015-10-03 LAB — LIPID PANEL
CHOL/HDL RATIO: 3.7 ratio (ref 0.0–4.4)
Cholesterol, Total: 177 mg/dL (ref 100–199)
HDL: 48 mg/dL (ref >39)
LDL Calculated: 83 mg/dL (ref 0–99)
TRIGLYCERIDES: 229 mg/dL — AB (ref 0–149)
VLDL Cholesterol Cal: 46 mg/dL — ABNORMAL HIGH (ref 5–40)

## 2015-10-03 LAB — HEMOGLOBIN A1C
ESTIMATED AVERAGE GLUCOSE: 154 mg/dL
Hgb A1c MFr Bld: 7 % — ABNORMAL HIGH (ref 4.8–5.6)

## 2015-10-03 LAB — MICROALBUMIN / CREATININE URINE RATIO
CREATININE, UR: 106.1 mg/dL
MICROALB/CREAT RATIO: 36.8 mg/g{creat} — AB (ref 0.0–30.0)
MICROALBUM., U, RANDOM: 39 ug/mL

## 2015-10-03 LAB — TSH: TSH: 2.8 u[IU]/mL (ref 0.450–4.500)

## 2015-11-08 ENCOUNTER — Other Ambulatory Visit: Payer: Self-pay | Admitting: Internal Medicine

## 2015-11-24 ENCOUNTER — Other Ambulatory Visit: Payer: Self-pay | Admitting: Internal Medicine

## 2015-11-24 DIAGNOSIS — F419 Anxiety disorder, unspecified: Secondary | ICD-10-CM

## 2015-11-24 MED ORDER — DIAZEPAM 5 MG PO TABS: 5.0000 mg | ORAL_TABLET | Freq: Every evening | ORAL | Status: DC | PRN

## 2015-12-13 ENCOUNTER — Other Ambulatory Visit: Payer: Self-pay | Admitting: Internal Medicine

## 2016-02-09 ENCOUNTER — Encounter: Payer: Self-pay | Admitting: Internal Medicine

## 2016-02-10 ENCOUNTER — Ambulatory Visit: Payer: Medicare Other | Admitting: Internal Medicine

## 2016-02-12 ENCOUNTER — Encounter: Payer: Self-pay | Admitting: Internal Medicine

## 2016-02-12 ENCOUNTER — Ambulatory Visit (INDEPENDENT_AMBULATORY_CARE_PROVIDER_SITE_OTHER): Payer: Medicare HMO | Admitting: Internal Medicine

## 2016-02-12 VITALS — BP 138/72 | HR 68 | Ht 62.5 in | Wt 188.6 lb

## 2016-02-12 DIAGNOSIS — M25512 Pain in left shoulder: Secondary | ICD-10-CM

## 2016-02-12 DIAGNOSIS — L259 Unspecified contact dermatitis, unspecified cause: Secondary | ICD-10-CM | POA: Diagnosis not present

## 2016-02-12 MED ORDER — TRIAMCINOLONE ACETONIDE 0.1 % EX CREA: 1.0000 "application " | TOPICAL_CREAM | Freq: Two times a day (BID) | CUTANEOUS | Status: DC

## 2016-02-12 NOTE — Progress Notes (Signed)
Date:  02/12/2016   Name:  Samantha Lang   DOB:  1941/01/01   MRN:  [id number]   Chief Complaint: Rash and Arm Pain Rash This is a new problem. The current episode started in the past 7 days. The affected locations include the torso. She was exposed to nothing. Pertinent negatives include no fatigue, fever or shortness of breath. Past treatments include nothing. There is no history of allergies or asthma.  Arm Pain  The incident occurred more than 1 week ago. The pain is present in the left shoulder. The quality of the pain is described as aching. The pain does not radiate. The pain is mild. Pertinent negatives include no chest pain. The symptoms are aggravated by movement and lifting. She has tried acetaminophen for the symptoms.     Review of Systems  Constitutional: Negative for fever and fatigue.  Respiratory: Negative for chest tightness, shortness of breath and wheezing.   Cardiovascular: Negative for chest pain and palpitations.  Musculoskeletal: Positive for myalgias.  Skin: Positive for rash. Negative for color change and wound.  Neurological: Negative for headaches.  Psychiatric/Behavioral: Negative for sleep disturbance and dysphoric mood.    Patient Active Problem List   Diagnosis Date Noted  . Acid reflux 10/02/2015  . 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 Combined fat and carbohydrate induced hyperlipemia 04/08/2015  . Aortic heart valve narrowing 03/26/2015  . Carotid artery narrowing 03/26/2015  . Polyarthralgia 09/27/2011  . Anxiety   . Allergy   . Postmenopausal atrophic vaginitis   . Depression   . 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 Essential hypertension     Prior to Admission medications   Medication Sig Start Date End Date Taking? Authorizing Provider  amLODipine (NORVASC) 5 MG tablet Take 5 mg by mouth daily.     Yes Historical Provider, MD  aspirin 81 MG tablet  Take 81 mg by mouth daily.   Yes Historical Provider, MD  atorvastatin (LIPITOR) 10 MG tablet Take 1 tablet by mouth daily. 07/12/15  Yes Historical Provider, MD  carvedilol (COREG) 3.125 MG tablet Take 1 tablet by mouth 2 (two) times daily. 01/20/16 01/19/17 Yes Historical Provider, MD  diazepam (VALIUM) 5 MG tablet Take 1 tablet (5 mg total) by mouth at bedtime as needed. 11/24/15  Yes Glean Hess, MD  hydrocortisone 0.5 % cream Apply topically as needed.     Yes Historical Provider, MD  JANUVIA 100 MG tablet TAKE 1 TABLET BY MOUTH EVERY DAY 12/14/15  Yes Glean Hess, MD  losartan-hydrochlorothiazide (HYZAAR) 100-25 MG per tablet Take 1 tablet by mouth daily.     Yes Historical Provider, MD  Multiple Vitamin (MULTIVITAMIN) tablet Take 1 tablet by mouth daily.     Yes Historical Provider, MD  venlafaxine XR (EFFEXOR-XR) 150 MG 24 hr capsule TAKE ONE CAPSULE BY MOUTH EVERY DAY 07/17/15  Yes Glean Hess, MD    Allergies  Allergen Reactions  . Amlodipine Swelling    Higher doses caused swelling   . Cephalexin   . Clarithromycin   . Lipitor [Atorvastatin Calcium]     Past Surgical History  Procedure Laterality Date  . Breast biopsy  2009    right, benign   . Eye surgery  Oct 2009    for ptosis,  Dr. Rosaria Ferries, eyelid lift    Social History  Substance Use Topics  . Smoking status: Former Smoker  Quit date: 09/25/1988  . Smokeless tobacco: Never Used  . Alcohol Use: 0.0 oz/week    0 Standard drinks or equivalent per week     Medication list has been reviewed and updated.   Physical Exam  Constitutional: She is oriented to person, place, and time. Vital signs are normal. She appears well-developed. No distress.  HENT:  Head: Normocephalic and atraumatic.  Cardiovascular: Normal rate, regular rhythm and S1 normal.   Pulmonary/Chest: Effort normal and breath sounds normal. No respiratory distress. She has no wheezes.  Musculoskeletal: Normal range of motion.        Left shoulder: She exhibits normal range of motion, no tenderness, no bony tenderness, no swelling and no effusion.  Neurological: She is alert and oriented to person, place, and time. She has normal strength. No sensory deficit.  Skin: Skin is warm and dry. Rash noted. Rash is macular.     Psychiatric: She has a normal mood and affect. Her behavior is normal. Thought content normal.    BP 138/72 mmHg  Pulse 68  Ht 5' 2.5" (1.588 m)  Wt 188 lb 9.6 oz (85.548 kg)  BMI 33.92 kg/m2  Assessment and Plan: 1. Contact dermatitis Use topical cream as needed - triamcinolone cream (KENALOG) 0.1 %; Apply 1 application topically 2 (two) times daily.  Dispense: 30 g; Refill: 1  2. Shoulder pain, left Strain - does not seem to be rotator cuff Use Voltaren gel as needed   Halina Maidens, MD [facility]  02/12/2016

## 2016-02-17 ENCOUNTER — Ambulatory Visit: Payer: PRIVATE HEALTH INSURANCE

## 2016-02-22 ENCOUNTER — Ambulatory Visit
Admission: RE | Admit: 2016-02-22 | Discharge: 2016-02-22 | Disposition: A | Discharge status: Home / Self Care | Payer: Medicare HMO | Source: Ambulatory Visit | Attending: Internal Medicine | Admitting: Internal Medicine

## 2016-02-22 DIAGNOSIS — Z1231 Encounter for screening mammogram for malignant neoplasm of breast: Secondary | ICD-10-CM | POA: Diagnosis not present

## 2016-02-22 DIAGNOSIS — Z1239 Encounter for other screening for malignant neoplasm of breast: Secondary | ICD-10-CM

## 2016-02-23 ENCOUNTER — Other Ambulatory Visit: Payer: Self-pay | Admitting: Internal Medicine

## 2016-02-23 DIAGNOSIS — R928 Other abnormal and inconclusive findings on diagnostic imaging of breast: Secondary | ICD-10-CM

## 2016-02-25 DIAGNOSIS — L02425 Furuncle of right lower limb: Secondary | ICD-10-CM | POA: Diagnosis not present

## 2016-02-25 DIAGNOSIS — L309 Dermatitis, unspecified: Secondary | ICD-10-CM | POA: Diagnosis not present

## 2016-02-25 DIAGNOSIS — D229 Melanocytic nevi, unspecified: Secondary | ICD-10-CM | POA: Diagnosis not present

## 2016-02-25 DIAGNOSIS — Z1283 Encounter for screening for malignant neoplasm of skin: Secondary | ICD-10-CM | POA: Diagnosis not present

## 2016-02-25 DIAGNOSIS — L02426 Furuncle of left lower limb: Secondary | ICD-10-CM | POA: Diagnosis not present

## 2016-02-25 DIAGNOSIS — Z872 Personal history of diseases of the skin and subcutaneous tissue: Secondary | ICD-10-CM | POA: Diagnosis not present

## 2016-03-03 ENCOUNTER — Ambulatory Visit
Admission: RE | Admit: 2016-03-03 | Discharge: 2016-03-03 | Disposition: A | Discharge status: Home / Self Care | Payer: Medicare HMO | Source: Ambulatory Visit | Attending: Internal Medicine | Admitting: Internal Medicine

## 2016-03-03 DIAGNOSIS — R928 Other abnormal and inconclusive findings on diagnostic imaging of breast: Secondary | ICD-10-CM | POA: Insufficient documentation

## 2016-03-10 DIAGNOSIS — E119 Type 2 diabetes mellitus without complications: Secondary | ICD-10-CM | POA: Diagnosis not present

## 2016-03-11 ENCOUNTER — Encounter: Payer: Self-pay | Admitting: Internal Medicine

## 2016-03-14 ENCOUNTER — Encounter: Payer: Self-pay | Admitting: Internal Medicine

## 2016-03-14 ENCOUNTER — Ambulatory Visit (INDEPENDENT_AMBULATORY_CARE_PROVIDER_SITE_OTHER): Payer: Medicare HMO | Admitting: Internal Medicine

## 2016-03-14 VITALS — BP 142/64 | HR 76 | Ht 62.5 in | Wt 186.0 lb

## 2016-03-14 DIAGNOSIS — E1169 Type 2 diabetes mellitus with other specified complication: Secondary | ICD-10-CM

## 2016-03-14 DIAGNOSIS — I10 Essential (primary) hypertension: Secondary | ICD-10-CM

## 2016-03-14 DIAGNOSIS — E785 Hyperlipidemia, unspecified: Secondary | ICD-10-CM

## 2016-03-14 DIAGNOSIS — E119 Type 2 diabetes mellitus without complications: Secondary | ICD-10-CM | POA: Diagnosis not present

## 2016-03-14 DIAGNOSIS — R21 Rash and other nonspecific skin eruption: Secondary | ICD-10-CM

## 2016-03-14 DIAGNOSIS — E118 Type 2 diabetes mellitus with unspecified complications: Secondary | ICD-10-CM | POA: Insufficient documentation

## 2016-03-14 NOTE — Progress Notes (Signed)
Date:  03/14/2016   Name:  Samantha Lang   DOB:  Apr 13, 1941   MRN:  [id number]   Chief Complaint: Follow-up; Diabetes; and Dermatitis Diabetes She presents for her follow-up diabetic visit. She has type 2 diabetes mellitus. Her disease course has been stable. Pertinent negatives for hypoglycemia include no dizziness, headaches, pallor or tremors. Pertinent negatives for diabetes include no blurred vision, no chest pain, no fatigue, no foot paresthesias, no polydipsia and no polyuria. There are no hypoglycemic complications. Symptoms are stable. Current diabetic treatment includes oral agent (monotherapy). She is compliant with treatment most of the time. She monitors urine at home <1 x per month.  Hypertension This is a chronic problem. The current episode started more than 1 year ago. The problem is unchanged. The problem is controlled. Pertinent negatives include no blurred vision, chest pain, headaches, palpitations or shortness of breath.  Rash This is a chronic problem. The problem has been waxing and waning since onset. The affected locations include the abdomen. Pertinent negatives include no cough, fatigue, fever or shortness of breath. Past treatments include topical steroids (seen by Dermatology - no cause found).    Lab Results  Component Value Date   HGBA1C 7.0* 10/02/2015     Review of Systems  Constitutional: Negative for fever, chills, appetite change, fatigue and unexpected weight change.  HENT: Negative for tinnitus and trouble swallowing.   Eyes: Negative for blurred vision and visual disturbance.  Respiratory: Negative for cough, chest tightness and shortness of breath.   Cardiovascular: Negative for chest pain, palpitations and leg swelling.  Gastrointestinal: Negative for abdominal pain.  Endocrine: Negative for polydipsia and polyuria.  Genitourinary: Negative for dysuria, hematuria and difficulty urinating.  Musculoskeletal: Negative for arthralgias.  Skin:  Positive for rash. Negative for pallor and wound.  Neurological: Negative for dizziness, tremors, numbness and headaches.  Psychiatric/Behavioral: Negative for sleep disturbance and dysphoric mood.    Patient Active Problem List   Diagnosis Date Noted  . Shoulder pain, left 02/12/2016  . Acid reflux 10/02/2015  . 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 Hyperlipidemia associated with type 2 diabetes mellitus ([place]) 04/08/2015  . Aortic heart valve narrowing 03/26/2015  . Carotid artery narrowing 03/26/2015  . Polyarthralgia 09/27/2011  . Anxiety   . Environmental and seasonal allergies   . Postmenopausal atrophic vaginitis   . Depression   . 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 Essential hypertension     Prior to Admission medications   Medication Sig Start Date End Date Taking? Authorizing Provider  amLODipine (NORVASC) 5 MG tablet Take 5 mg by mouth daily.     Yes Historical Provider, MD  aspirin 81 MG tablet Take 81 mg by mouth daily.   Yes Historical Provider, MD  atorvastatin (LIPITOR) 10 MG tablet Take 1 tablet by mouth daily. 07/12/15  Yes Historical Provider, MD  carvedilol (COREG) 3.125 MG tablet Take 1 tablet by mouth 2 (two) times daily. 01/20/16 01/19/17 Yes Historical Provider, MD  clobetasol cream (TEMOVATE) 0.05 % APR BID UP TO 2 WKS PER MONTH PRN 02/25/16  Yes Historical Provider, MD  diazepam (VALIUM) 5 MG tablet Take 1 tablet (5 mg total) by mouth at bedtime as needed. 11/24/15  Yes Glean Hess, MD  hydrocortisone 0.5 % cream Apply topically as needed.     Yes Historical Provider, MD  JANUVIA 100 MG tablet TAKE 1 TABLET BY MOUTH EVERY DAY 12/14/15  Yes Glean Hess, MD  losartan-hydrochlorothiazide (HYZAAR) 100-25 MG per tablet Take 1 tablet by mouth daily.     Yes Historical Provider, MD  Multiple Vitamin (MULTIVITAMIN) tablet Take 1 tablet by mouth daily.     Yes Historical Provider,  MD  triamcinolone cream (KENALOG) 0.1 % Apply 1 application topically 2 (two) times daily. 02/12/16  Yes Glean Hess, MD  venlafaxine XR (EFFEXOR-XR) 150 MG 24 hr capsule TAKE ONE CAPSULE BY MOUTH EVERY DAY 07/17/15  Yes Glean Hess, MD    Allergies  Allergen Reactions  . Amlodipine Swelling    Higher doses caused swelling   . Cephalexin   . Clarithromycin   . Lipitor [Atorvastatin Calcium]     Past Surgical History  Procedure Laterality Date  . Eye surgery  Oct 2009    for ptosis,  Dr. Rosaria Ferries, eyelid lift  . Breast biopsy Right 03/21/08    right, benign     Social History  Substance Use Topics  . Smoking status: Former Smoker    Quit date: 09/25/1988  . Smokeless tobacco: Never Used  . Alcohol Use: 0.0 oz/week    0 Standard drinks or equivalent per week     Medication list has been reviewed and updated.   Physical Exam  Constitutional: She is oriented to person, place, and time. She appears well-developed. No distress.  HENT:  Head: Normocephalic and atraumatic.  Cardiovascular: Normal rate, regular rhythm and normal heart sounds.   Pulmonary/Chest: Effort normal and breath sounds normal. No respiratory distress.  Musculoskeletal: Normal range of motion.  Neurological: She is alert and oriented to person, place, and time.  Skin: Skin is warm and dry. No rash noted.     Psychiatric: She has a normal mood and affect. Her behavior is normal. Thought content normal.    BP 142/64 mmHg  Pulse 76  Ht 5' 2.5" (1.588 m)  Wt 186 lb (84.369 kg)  BMI 33.46 kg/m2  Assessment and Plan: 1. Hyperlipidemia associated with type 2 diabetes mellitus ([place]) On statin therapy Lab Results  Component Value Date   CHOL 177 10/02/2015   HDL 48 10/02/2015   LDLCALC 83 10/02/2015   LDLDIRECT 138.3 09/26/2011   TRIG 229* 10/02/2015   CHOLHDL 3.7 10/02/2015    2. Essential hypertension controlled  3. DM type 2 without retinopathy ([place]) Controlled Lab Results    Component Value Date   HGBA1C 7.0* 10/02/2015   - Hemoglobin A1c  4. Rash and nonspecific skin eruption Continue topical steroids Follow up with Dermatology if needed  -Patient instructed to call GI - Dr Epimenio Foot - to schedule 10 yr follow up colonoscopy.  Halina Maidens, MD [facility]  03/14/2016

## 2016-03-15 LAB — HEMOGLOBIN A1C
ESTIMATED AVERAGE GLUCOSE: 154 mg/dL
HEMOGLOBIN A1C: 7 % — AB (ref 4.8–5.6)

## 2016-03-16 ENCOUNTER — Telehealth: Payer: Self-pay

## 2016-03-16 NOTE — Telephone Encounter (Signed)
Spoke with patient. Patient advised of all results and verbalized understanding. Will call back with any future questions or concerns. MAH  

## 2016-03-16 NOTE — Telephone Encounter (Signed)
-----   Message from Glean Hess, MD sent at 03/15/2016  5:16 PM EDT ----- DM is good.  A1C is unchanged.

## 2016-03-19 ENCOUNTER — Other Ambulatory Visit: Payer: Self-pay | Admitting: Internal Medicine

## 2016-03-21 ENCOUNTER — Ambulatory Visit: Payer: Medicare HMO

## 2016-04-13 DIAGNOSIS — I10 Essential (primary) hypertension: Secondary | ICD-10-CM | POA: Diagnosis not present

## 2016-04-13 DIAGNOSIS — I340 Nonrheumatic mitral (valve) insufficiency: Secondary | ICD-10-CM | POA: Diagnosis not present

## 2016-04-13 DIAGNOSIS — R600 Localized edema: Secondary | ICD-10-CM | POA: Insufficient documentation

## 2016-04-13 DIAGNOSIS — E782 Mixed hyperlipidemia: Secondary | ICD-10-CM | POA: Diagnosis not present

## 2016-04-19 ENCOUNTER — Other Ambulatory Visit: Payer: Self-pay | Admitting: Internal Medicine

## 2016-06-02 DIAGNOSIS — I350 Nonrheumatic aortic (valve) stenosis: Secondary | ICD-10-CM | POA: Diagnosis not present

## 2016-06-02 DIAGNOSIS — I10 Essential (primary) hypertension: Secondary | ICD-10-CM | POA: Diagnosis not present

## 2016-07-11 ENCOUNTER — Encounter: Payer: Self-pay | Admitting: Internal Medicine

## 2016-07-11 ENCOUNTER — Ambulatory Visit (INDEPENDENT_AMBULATORY_CARE_PROVIDER_SITE_OTHER): Payer: Medicare HMO | Admitting: Internal Medicine

## 2016-07-11 VITALS — BP 124/48 | HR 78 | Resp 16 | Ht 62.75 in | Wt 187.0 lb

## 2016-07-11 DIAGNOSIS — N950 Postmenopausal bleeding: Secondary | ICD-10-CM | POA: Diagnosis not present

## 2016-07-11 DIAGNOSIS — R87619 Unspecified abnormal cytological findings in specimens from cervix uteri: Secondary | ICD-10-CM | POA: Diagnosis not present

## 2016-07-11 NOTE — Progress Notes (Signed)
Date:  07/11/2016   Name:  Samantha Lang   DOB:  [birth year]   MRN:  [id number]   Chief Complaint: Vaginal Bleeding Vaginal Bleeding The patient's primary symptoms include vaginal bleeding. The patient's pertinent negatives include no pelvic pain. This is a new problem. The current episode started in the past 7 days. The problem occurs intermittently. The problem has been waxing and waning. The patient is experiencing no pain. Pertinent negatives include no abdominal pain, chills, diarrhea, fever or vomiting. The vaginal discharge was bloody and dark. The vaginal bleeding is lighter than menses. She has not been passing clots. She has not been passing tissue. She is not sexually active. She is postmenopausal.      Review of Systems  Constitutional: Negative for fever, chills and fatigue.  Respiratory: Negative for cough, chest tightness and shortness of breath.   Cardiovascular: Negative for chest pain and palpitations.  Gastrointestinal: Negative for vomiting, abdominal pain and diarrhea.  Genitourinary: Positive for vaginal bleeding. Negative for genital sores and pelvic pain.    Patient Active Problem List   Diagnosis Date Noted  . Edema leg 04/13/2016  . DM type 2 without retinopathy ([place]) 03/14/2016  . Shoulder pain, left 02/12/2016  . Acid reflux 10/02/2015  . 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 Hyperlipidemia associated with type 2 diabetes mellitus ([place]) 04/08/2015  . Aortic heart valve narrowing 03/26/2015  . Carotid artery narrowing 03/26/2015  . 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 Polyarthralgia 09/27/2011  . Anxiety   . Environmental and seasonal allergies   . Postmenopausal atrophic vaginitis   . Depression   . 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 Essential hypertension     Prior to Admission medications   Medication Sig Start Date End Date  Taking? Authorizing Provider  amLODipine (NORVASC) 5 MG tablet Take 5 mg by mouth daily.     Yes Historical Provider, MD  aspirin 81 MG tablet Take 81 mg by mouth daily.   Yes Historical Provider, MD  atorvastatin (LIPITOR) 10 MG tablet Take 1 tablet by mouth daily. 07/12/15  Yes Historical Provider, MD  carvedilol (COREG) 3.125 MG tablet Take 1 tablet by mouth 2 (two) times daily. 01/20/16 01/19/17 Yes Historical Provider, MD  clobetasol cream (TEMOVATE) 0.05 % APR BID UP TO 2 WKS PER MONTH PRN 02/25/16  Yes Historical Provider, MD  diazepam (VALIUM) 5 MG tablet Take 1 tablet (5 mg total) by mouth at bedtime as needed. 11/24/15  Yes Glean Hess, MD  JANUVIA 100 MG tablet TAKE 1 TABLET BY MOUTH EVERY DAY 03/19/16  Yes Glean Hess, MD  Multiple Vitamin (MULTIVITAMIN) tablet Take 1 tablet by mouth daily.     Yes Historical Provider, MD  pantoprazole (PROTONIX) 40 MG tablet Take by mouth.   Yes Historical Provider, MD  telmisartan-hydrochlorothiazide (MICARDIS HCT) 80-25 MG tablet Take 1 tablet by mouth daily. 04/13/16 04/13/17 Yes Historical Provider, MD  triamcinolone cream (KENALOG) 0.1 % Apply 1 application topically 2 (two) times daily. 02/12/16  Yes Glean Hess, MD  venlafaxine XR (EFFEXOR-XR) 150 MG 24 hr capsule TAKE ONE CAPSULE BY MOUTH EVERY DAY 07/17/15  Yes Glean Hess, MD    Allergies  Allergen Reactions  . Amlodipine Swelling    Higher doses caused swelling   . Cephalexin   . Clarithromycin   . Lipitor [Atorvastatin Calcium]     Past Surgical History  Procedure Laterality Date  . Eye surgery  Oct 2009    for ptosis,  Dr. Rosaria Ferries, eyelid lift  . Breast biopsy Right 03/21/08    right, benign     Social History  Substance Use Topics  . Smoking status: Former Smoker    Quit date: 09/25/1988  . Smokeless tobacco: Never Used  . Alcohol Use: 0.0 oz/week    0 Standard drinks or equivalent per week     Medication list has been reviewed and updated.   Physical  Exam  Constitutional: She is oriented to person, place, and time. She appears well-developed. No distress.  HENT:  Head: Normocephalic and atraumatic.  Cardiovascular: Normal rate, regular rhythm and normal heart sounds.   Pulmonary/Chest: Effort normal and breath sounds normal. No respiratory distress.  Genitourinary: Vagina normal and uterus normal. There is no tenderness, lesion or injury on the right labia. There is no tenderness, lesion or injury on the left labia. Cervix exhibits discharge (dark blood). Right adnexum displays no mass, no tenderness and no fullness. Left adnexum displays no mass, no tenderness and no fullness.  Musculoskeletal: Normal range of motion.  Neurological: She is alert and oriented to person, place, and time.  Skin: Skin is warm and dry. No rash noted.  Psychiatric: She has a normal mood and affect. Her behavior is normal. Thought content normal.  Nursing note and vitals reviewed.   BP 124/48 mmHg  Pulse 78  Resp 16  Ht 5' 2.75" (1.594 m)  Wt 187 lb (84.823 kg)  BMI 33.38 kg/m2  SpO2 98%  Assessment and Plan: 1. Post-menopausal bleeding - Ambulatory referral to Gynecology - US Transvaginal Non-OB; Future - Pap IG (Image Guided)   Halina Maidens, MD [facility]  07/11/2016

## 2016-07-15 ENCOUNTER — Telehealth: Payer: Self-pay

## 2016-07-15 ENCOUNTER — Other Ambulatory Visit: Payer: Self-pay

## 2016-07-15 ENCOUNTER — Other Ambulatory Visit: Payer: Self-pay | Admitting: Internal Medicine

## 2016-07-15 DIAGNOSIS — N950 Postmenopausal bleeding: Secondary | ICD-10-CM

## 2016-07-15 MED ORDER — PANTOPRAZOLE SODIUM 40 MG PO TBEC: 40.0000 mg | DELAYED_RELEASE_TABLET | Freq: Every day | ORAL | Status: DC

## 2016-07-15 NOTE — Telephone Encounter (Signed)
Patient states she still is having burning in stomach, used to take Protonix and wants to try again, wants to send to [facility] in [facility]

## 2016-07-18 ENCOUNTER — Encounter: Payer: Self-pay | Admitting: Internal Medicine

## 2016-07-18 ENCOUNTER — Ambulatory Visit
Admission: RE | Admit: 2016-07-18 | Discharge: 2016-07-18 | Disposition: A | Discharge status: Home / Self Care | Payer: Medicare HMO | Source: Ambulatory Visit | Attending: Internal Medicine | Admitting: Internal Medicine

## 2016-07-18 DIAGNOSIS — N950 Postmenopausal bleeding: Secondary | ICD-10-CM | POA: Insufficient documentation

## 2016-07-18 LAB — PAP IG (IMAGE GUIDED): PAP Smear Comment: 0

## 2016-07-20 ENCOUNTER — Encounter: Payer: Self-pay | Admitting: Obstetrics and Gynecology

## 2016-07-20 ENCOUNTER — Ambulatory Visit (INDEPENDENT_AMBULATORY_CARE_PROVIDER_SITE_OTHER): Payer: Medicare HMO | Admitting: Obstetrics and Gynecology

## 2016-07-20 VITALS — BP 165/63 | HR 73 | Ht 62.0 in | Wt 180.2 lb

## 2016-07-20 DIAGNOSIS — N888 Other specified noninflammatory disorders of cervix uteri: Secondary | ICD-10-CM

## 2016-07-20 DIAGNOSIS — R87619 Unspecified abnormal cytological findings in specimens from cervix uteri: Secondary | ICD-10-CM

## 2016-07-20 DIAGNOSIS — N950 Postmenopausal bleeding: Secondary | ICD-10-CM | POA: Diagnosis not present

## 2016-07-20 DIAGNOSIS — C541 Malignant neoplasm of endometrium: Secondary | ICD-10-CM | POA: Diagnosis not present

## 2016-07-20 DIAGNOSIS — N72 Inflammatory disease of cervix uteri: Secondary | ICD-10-CM | POA: Diagnosis not present

## 2016-07-20 NOTE — Progress Notes (Signed)
GYN ENCOUNTER NOTE  Subjective:       Samantha Lang is a 75 y.o. G79P2001 female is here for gynecologic evaluation of the following issues:  1. Postmenopausal bleeding.    Postmenopausal bleeding developed on 07/08/2016 and continues to the present. Initial bleeding was bright red blood; current bleeding is spotting. No pelvic pain. No hormone replacement therapy. Recent ultrasound demonstrates a thickened endometrium measuring 12 mm and a heterogenous endocervical mass with blood flow of uncertain significance Recent Pap smear is notable for AGUS findings  Comorbidities include hypertension, type 2 diabetes mellitus, hyperlipidemia.   Gynecologic History No LMP recorded. Patient is postmenopausal. Contraception: post menopausal status Last Pap: 07/11/2016 AGUS Last mammogram: 03/03/2016 BI-RADS 1  Obstetric History OB History  Gravida Para Term Preterm AB Living  2 2 2     1   SAB TAB Ectopic Multiple Live Births          1    # Outcome Date GA Lbr Len/2nd Weight Sex Delivery Anes PTL Lv  2 Term 1972   6 lb 6.4 oz (2.903 kg) F Vag-Spont  Y   1 Term 1969   6 lb 2.4 oz (2.79 kg) F Vag-Spont   LIV      Past Medical History:  Diagnosis Date  . Allergy   . Anxiety   . Depression    mild  . Diabetes mellitus age 75  . GERD (gastroesophageal reflux disease)   . Hemorrhoids   . Hyperlipidemia   . Hypertension 2003  . Incontinence    Female stress  . Postmenopausal atrophic vaginitis     Past Surgical History:  Procedure Laterality Date  . BREAST BIOPSY Right 03/21/08   right, benign   . EYE SURGERY  Oct 2009   for ptosis,  Dr. Rosaria Ferries, eyelid lift    Current Outpatient Prescriptions on File Prior to Visit  Medication Sig Dispense Refill  . amLODipine (NORVASC) 5 MG tablet Take 5 mg by mouth daily.      Marland Kitchen aspirin 81 MG tablet Take 81 mg by mouth daily.    Marland Kitchen atorvastatin (LIPITOR) 10 MG tablet Take 1 tablet by mouth daily.  5  . carvedilol (COREG) 3.125 MG tablet  Take 1 tablet by mouth 2 (two) times daily.    . diazepam (VALIUM) 5 MG tablet Take 1 tablet (5 mg total) by mouth at bedtime as needed. 30 tablet 3  . JANUVIA 100 MG tablet TAKE 1 TABLET BY MOUTH EVERY DAY 30 tablet 5  . pantoprazole (PROTONIX) 40 MG tablet Take 1 tablet (40 mg total) by mouth daily. 30 tablet 1  . telmisartan-hydrochlorothiazide (MICARDIS HCT) 80-25 MG tablet Take 1 tablet by mouth daily.    Marland Kitchen venlafaxine XR (EFFEXOR-XR) 150 MG 24 hr capsule TAKE ONE CAPSULE BY MOUTH EVERY DAY 90 capsule 3   No current facility-administered medications on file prior to visit.     Allergies  Allergen Reactions  . Amlodipine Swelling    Higher doses caused swelling   . Cephalexin   . Clarithromycin   . Lipitor [Atorvastatin Calcium]     Legs ache  With higher dose    Social History   Social History  . Marital status: Married    Spouse name: N/A  . Number of children: N/A  . Years of education: N/A   Occupational History  . Not on file.   Social History Main Topics  . Smoking status: Former Smoker    Quit date: 09/25/1988  .  Smokeless tobacco: Never Used  . Alcohol use 0.0 oz/week  . Drug use: No  . Sexual activity: Not on file   Other Topics Concern  . Not on file   Social History Narrative  . No narrative on file    Family History  Problem Relation Age of Onset  . Breast cancer Paternal Aunt   . Breast cancer Paternal Grandmother   . Cancer Father 37    lung    The following portions of the patient's history were reviewed and updated as appropriate: allergies, current medications, past family history, past medical history, past social history, past surgical history and problem list.  Review of Systems Review of Systems -Per history of present illness Objective:   BP (!) 165/63   Pulse 73   Ht 5\' 2"  (1.575 m)   Wt 180 lb 3.2 oz (81.7 kg)   BMI 32.96 kg/m  CONSTITUTIONAL: Well-developed, well-nourished female in no acute distress.  HENT:   Normocephalic, atraumatic.  NECK: Normal range of motion, supple, no masses.  Normal thyroid.  SKIN: Skin is warm and dry. No rash noted. Not diaphoretic. No erythema. No pallor. [place]: Alert and oriented to person, place, and time. PSYCHIATRIC: Normal mood and affect. Normal behavior. Normal judgment and thought content. CARDIOVASCULAR:Not Examined RESPIRATORY: Not Examined BREASTS: Not Examined ABDOMEN: Soft, non distended; Non tender.  No Organomegaly. PELVIC:  External Genitalia: Normal  BUS: Normal  Vagina: Atrophic changes  Cervix: Stenosis requiring endocervical dilation with lacrimal duct probes; no gross lesions  Uterus: Normal size, shape,consistency, mobile  Adnexa: Normal  RV: Normal external exam  Bladder: Nontender MUSCULOSKELETAL: Normal range of motion. No tenderness.  No cyanosis, clubbing, or edema.  PROCEDURE: Endometrial biopsy with endocervical canal dilation and ECC Verbal consent is obtained. Patient is placed in dorsal lithotomy position. Peterson speculum was placed into the vagina for cervix visualization. Single-tooth tenaculum was placed on the anterior lip of the cervix. Lacrimal duct probes were used to dilate the endocervical canal. Milex 3 mm pipette is placed and suction applied to obtain lush endometrial sample. ECC is performed with a rectangular curette followed by Cytobrush for obtaining the specimen. Procedure was well-tolerated. Blood loss minimal. Tissue specimens are sent to pathology.   Assessment:  1. Postmenopausal bleeding 2. AGUS Pap smear 3. Endocervical mass on ultrasound  Plan:    1. Endometrial biopsy 2. ECC 3. Endocervical canal dilation 4. Return in 2 weeks for follow-up on biopsies and further management planning  Brayton Mars, MD  Note: This dictation was prepared with Dragon dictation along with smaller phrase technology. Any transcriptional errors that result from this process are unintentional.

## 2016-07-20 NOTE — Patient Instructions (Signed)

## 2016-07-22 LAB — PATHOLOGY

## 2016-07-24 ENCOUNTER — Other Ambulatory Visit: Payer: Self-pay | Admitting: Internal Medicine

## 2016-07-24 DIAGNOSIS — F419 Anxiety disorder, unspecified: Secondary | ICD-10-CM

## 2016-07-26 HISTORY — PX: ABDOMINAL HYSTERECTOMY: SHX81

## 2016-07-26 LAB — PATHOLOGY

## 2016-07-26 NOTE — Telephone Encounter (Signed)
Patient MUST have follow up in order to get more refills after this 30 day fill.

## 2016-08-02 ENCOUNTER — Encounter: Payer: Self-pay | Admitting: Obstetrics and Gynecology

## 2016-08-02 ENCOUNTER — Ambulatory Visit (INDEPENDENT_AMBULATORY_CARE_PROVIDER_SITE_OTHER): Payer: Medicare HMO | Admitting: Obstetrics and Gynecology

## 2016-08-02 VITALS — BP 182/65 | HR 73 | Ht 62.0 in | Wt 182.9 lb

## 2016-08-02 DIAGNOSIS — C541 Malignant neoplasm of endometrium: Secondary | ICD-10-CM

## 2016-08-02 DIAGNOSIS — I10 Essential (primary) hypertension: Secondary | ICD-10-CM

## 2016-08-02 DIAGNOSIS — N889 Noninflammatory disorder of cervix uteri, unspecified: Secondary | ICD-10-CM | POA: Diagnosis not present

## 2016-08-03 ENCOUNTER — Inpatient Hospital Stay: Payer: Medicare HMO | Attending: Obstetrics and Gynecology | Admitting: Obstetrics and Gynecology

## 2016-08-03 DIAGNOSIS — J302 Other seasonal allergic rhinitis: Secondary | ICD-10-CM

## 2016-08-03 DIAGNOSIS — I6523 Occlusion and stenosis of bilateral carotid arteries: Secondary | ICD-10-CM | POA: Diagnosis not present

## 2016-08-03 DIAGNOSIS — E119 Type 2 diabetes mellitus without complications: Secondary | ICD-10-CM | POA: Diagnosis not present

## 2016-08-03 DIAGNOSIS — Z7982 Long term (current) use of aspirin: Secondary | ICD-10-CM

## 2016-08-03 DIAGNOSIS — K219 Gastro-esophageal reflux disease without esophagitis: Secondary | ICD-10-CM

## 2016-08-03 DIAGNOSIS — C541 Malignant neoplasm of endometrium: Secondary | ICD-10-CM | POA: Diagnosis not present

## 2016-08-03 DIAGNOSIS — I071 Rheumatic tricuspid insufficiency: Secondary | ICD-10-CM

## 2016-08-03 DIAGNOSIS — Z79899 Other long term (current) drug therapy: Secondary | ICD-10-CM | POA: Diagnosis not present

## 2016-08-03 DIAGNOSIS — E785 Hyperlipidemia, unspecified: Secondary | ICD-10-CM

## 2016-08-03 DIAGNOSIS — Z8542 Personal history of malignant neoplasm of other parts of uterus: Secondary | ICD-10-CM | POA: Insufficient documentation

## 2016-08-03 DIAGNOSIS — I10 Essential (primary) hypertension: Secondary | ICD-10-CM | POA: Diagnosis not present

## 2016-08-03 DIAGNOSIS — C55 Malignant neoplasm of uterus, part unspecified: Secondary | ICD-10-CM | POA: Insufficient documentation

## 2016-08-03 DIAGNOSIS — I340 Nonrheumatic mitral (valve) insufficiency: Secondary | ICD-10-CM | POA: Diagnosis not present

## 2016-08-03 NOTE — Progress Notes (Signed)
Patient here for consult today no complaints or discomfort. Patient is still spotting and has been for the past several weeks.

## 2016-08-03 NOTE — Progress Notes (Signed)
  Oncology Nurse Navigator Documentation  Navigator Location: [facility] (08/03/16 1600) Navigator Encounter Type:  (Initial Gyn Onc) (08/03/16 1600)   Abnormal Finding Date: 07/18/16 (08/03/16 1600) Confirmed Diagnosis Date: 07/22/16 (08/03/16 1600) Surgery Date:  (Surgery to be performed at [facility]) (08/03/16 1600)   Patient Visit Type: Initial (08/03/16 1600) Treatment Phase: Pre-Tx/Tx Discussion (08/03/16 1600) Barriers/Navigation Needs: Coordination of Care (08/03/16 1600)   Interventions: Coordination of Care (08/03/16 1600)            Acuity: Level 2 (08/03/16 1600)         Time Spent with Patient: 30 (08/03/16 1600)   Chaperoned pelvic exam. [facility] will be in contact with patient regarding appt for this Friday 08/05/16 along with Ct scan for same day. Provided her my contact information for any future needs or concerns. Surgery to be performed at [facility].

## 2016-08-03 NOTE — Progress Notes (Addendum)
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 G2P1 female who is seen in consultation from Dr. Army Melia for uterine cancer.  Postmenopausal bleeding developed on 07/08/2016 and continues to the present. Initial bleeding was bright red blood; current bleeding is spotting. No pelvic pain. No hormone replacement therapy.    07/11/16 Pap smear is notable for AGUS findings.  [place] 07/18/16 IMPRESSION: 1. Irregular heterogeneously hypoechoic lesion in the region of the cervical os which appears to have internal blood flow. This is of uncertain etiology and significance, but differentials considerations include a prolapsing endometrial polyp, or primary cervical neoplasm. Correlation with physical examination is recommended. 2. Diffusely thickened endometrium (12 mm). In the setting of post-menopausal bleeding, endometrial sampling is indicated to exclude carcinoma. If results are benign, sonohysterogram should be considered for focal lesion work-up. (Ref: Radiological Reasoning: Algorithmic Workup of Abnormal Vaginal Bleeding with Endovaginal Sonography and Sonohysterography. AJR 2008; 537:S82-70). 3. Limited evaluation which could not visualize the left ovary secondary to overlying bowel gas. 4. Right ovary was normal in appearance.  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  COMMENT: Immunohistochemical stains for p16 and p53 are strongly  positive in the tumor cells and confirm the above diagnosis.   Comorbidities include hypertension, type 2 diabetes mellitus, hyperlipidemia. Has mild urinary incontinence.   Problem List: Patient Active Problem List   Diagnosis Date Noted  . Uterine cancer ([place]) 08/03/2016  . Edema leg 04/13/2016  . DM type 2 without retinopathy ([place]) 03/14/2016  . Shoulder  pain, left 02/12/2016  . Acid reflux 10/02/2015  . 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 Hyperlipidemia associated with type 2 diabetes mellitus ([place]) 04/08/2015  . Aortic heart valve narrowing 03/26/2015  . Carotid artery narrowing 03/26/2015  . 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 Polyarthralgia 09/27/2011  . Anxiety   . Environmental and seasonal allergies   . Postmenopausal atrophic vaginitis   . Depression   . 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 Essential hypertension     Past Medical History: Past Medical History:  Diagnosis Date  . Allergy   . Anxiety   . Depression    mild  . Diabetes mellitus age 32  . GERD (gastroesophageal reflux disease)   . Hemorrhoids   . Hyperlipidemia   . Hypertension 2003  . Incontinence    Female stress  . Postmenopausal atrophic vaginitis     Past Surgical History: Past Surgical History:  Procedure Laterality Date  . BREAST BIOPSY Right 03/21/08   right, benign   . EYE SURGERY  Oct 2009   for ptosis,  Dr. Rosaria Ferries, eyelid lift  . TUBAL LIGATION      OB History:  OB History  Gravida Para Term Preterm AB Living  2 2 2     2   SAB TAB Ectopic Multiple Live Births          2    # Outcome Date GA Lbr Len/2nd Weight Sex Delivery Anes PTL Lv  2 Term 1972   6 lb 6.4 oz (2.903 kg) F Vag-Spont  Y LIV  1 Term 1969   6 lb 2.4 oz (2.79 kg) F Vag-Spont   LIV      Family History: Family History  Problem Relation Age of  Onset  . Breast cancer Paternal Aunt   . Breast cancer Paternal Grandmother   . Cancer Father 70    lung  . Stroke Mother   . Hypertension Mother   . Ovarian cancer Neg Hx   . Colon cancer Neg Hx   . Diabetes Neg Hx     Social History: Social History   Social History  . Marital status: Married    Spouse name: N/A  . Number of children: N/A  . Years of education: N/A   Occupational History  . Not  on file.   Social History Main Topics  . Smoking status: Former Smoker    Quit date: 09/25/1988  . Smokeless tobacco: Never Used  . Alcohol use 0.0 oz/week     Comment: daily- beer,wine,liquor  . Drug use: No  . Sexual activity: Yes    Birth control/ protection: Post-menopausal   Other Topics Concern  . Not on file   Social History Narrative  . No narrative on file    Allergies: Allergies  Allergen Reactions  . Amlodipine Swelling    Higher doses caused swelling   . Cephalexin   . Clarithromycin   . Lipitor [Atorvastatin Calcium]     Legs ache  With higher dose    Current Medications: Current Outpatient Prescriptions  Medication Sig Dispense Refill  . amLODipine (NORVASC) 5 MG tablet Take 5 mg by mouth daily.      Marland Kitchen aspirin 81 MG tablet Take 81 mg by mouth daily.    Marland Kitchen atorvastatin (LIPITOR) 10 MG tablet Take 1 tablet by mouth daily.  5  . carvedilol (COREG) 3.125 MG tablet Take 1 tablet by mouth 2 (two) times daily.    . diazepam (VALIUM) 5 MG tablet Take 1 tablet (5 mg total) by mouth at bedtime as needed. 30 tablet 0  . JANUVIA 100 MG tablet TAKE 1 TABLET BY MOUTH EVERY DAY 30 tablet 5  . pantoprazole (PROTONIX) 40 MG tablet Take 1 tablet (40 mg total) by mouth daily. 30 tablet 1  . telmisartan-hydrochlorothiazide (MICARDIS HCT) 80-25 MG tablet Take 1 tablet by mouth daily.    Marland Kitchen venlafaxine XR (EFFEXOR-XR) 150 MG 24 hr capsule Take 1 capsule (150 mg total) by mouth daily. 30 capsule 0   No current facility-administered medications for this visit.     Review of Systems General: negative for, fevers, chills, fatigue, changes in sleep, changes in weight or appetite Skin: negative for changes in color, texture, moles or lesions Eyes: negative for, changes in vision, pain, diplopia HEENT: negative for, change in hearing, pain, discharge, tinnitus, vertigo, voice changes, sore throat, neck masses Breasts: negative for breast lumps Pulmonary: negative for, dyspnea,  orthopnea, productive cough Cardiac: negative for, palpitations, syncope, pain, discomfort, pressure Gastrointestinal: negative for, dysphagia, nausea, vomiting, jaundice, pain, constipation, diarrhea, hematemesis, hematochezia Genitourinary/Sexual: negative for, dysuria, discharge, hesitancy, nocturia, retention, stones, infections, STD's, incontinence Ob/Gyn: some pain with intercourse Musculoskeletal: negative for, pain, stiffness, swelling, range of motion limitation Hematology: No coagulation disorder, negative for, easy bruising, bleeding Neurologic/Psych: negative for, headaches, seizures, paralysis, weakness, tremor, change in gait, change in sensation, mood swings, depression, anxiety, change in memory  Objective:  Physical Examination:  BP (!) 178/69 (BP Location: Left Arm, Patient Position: Sitting)   Pulse 71   Temp 97 F (36.1 C) (Tympanic)   Ht 5' 2.5" (1.588 m)   Wt 180 lb 4 oz (81.8 kg)   BMI 32.44 kg/m    ECOG Performance Status: 0 - Asymptomatic  General appearance: alert, cooperative and appears stated age HEENT:PERRLA, neck supple with midline trachea and thyroid without masses Lymph node survey: non-palpable, axillary, inguinal, supraclavicular Cardiovascular: regular rate and rhythm, no murmurs or gallops Respiratory: normal air entry, lungs clear to auscultation and no rales, rhonchi or wheezing Breast exam: not examined. Abdomen: soft, non-tender, without masses or organomegaly, no hernias and well healed incision Back: inspection of back is normal Extremities: extremities normal, atraumatic, no cyanosis or edema Skin exam - normal coloration and turgor, no rashes, no suspicious skin lesions noted. Neurological exam reveals alert, oriented, normal speech, no focal findings or movement disorder noted.  Pelvic: exam chaperoned by nurse;  Vulva: normal appearing vulva with no masses, tenderness or lesions; Vagina: normal; Adnexa: normal adnexa in size, nontender  and no masses; Uterus: uterus is normal size, shape, consistency and nontender; Cervix: anteverted and no lesions; Rectal: not indicated and normal rectal, no masses    Assessment:  Samantha Lang is a 75 y.o. female diagnosed with high grade serous endometrial cancer.  No evidence of cervical involvement on exam, despite suggestion on ultrasound. No evidence of metastatic disease.   Medical co-morbidities complicating care: HTN, diabetes and obesity.  Plan:   Problem List Items Addressed This Visit      Genitourinary   Uterine cancer ([place])    Other Visit Diagnoses   None.    We discussed options for management including TLH, BSO, SLN mapping and biopsies and PA node biopsies. In view of the aggressiveness of serous uterine cancers she will have surgery at [facility].  We will do CT scan C/A/P preoperatively to r/o metastatic disease.  She will be seen at [facility] this Friday by Calton Dach PA for CT, pre op work up and [facility] appt.  She has been scheduled with my partner Crista Elliot next Tuesday.     The patient's diagnosis, an outline of the further diagnostic and laboratory studies which will be required, the recommendation, and alternatives were discussed.  All questions were answered to the patient's satisfaction.  A total of 60 minutes were spent with the patient/family today; 30% was spent in education, counseling and coordination of care for endometrial cancer.    Mellody Drown, MD  CC:  Glean Hess, [address] [place], [place] [address] [phone]

## 2016-08-03 NOTE — Progress Notes (Signed)
    GYNECOLOGY PROGRESS NOTE  Subjective:    Patient ID: Samantha Lang, female    DOB: 1941-04-24, 75 y.o.   MRN: [id number]  HPI  Patient is a 75 y.o. G64P2002 female who presents for discussion of ultrasound and biopsy results.  Patient with h/o PMB and AGUS pap smear (performed 07/11/2016). She denies complaints today.  The following portions of the patient's history were reviewed and updated as appropriate: allergies, current medications, past family history, past medical history, past social history, past surgical history and problem list.  Review of Systems Pertinent items noted in HPI and remainder of comprehensive ROS otherwise negative.   Objective:   Blood pressure (!) 182/65, pulse 73, height _0  (1.575 m), weight 182 lb 14.4 oz (83 kg). General appearance: alert and no distress Exam deferred.    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  COMMENT: Immunohistochemical stains for p16 and p53 are strongly positive in the tumor cells and confirm the above diagnosis.   ENDOCERVIX, CURETTINGS:  FRAGMENTS OF BENIGN ECTOCERVICAL AND ENDOCERVICAL TISSUE WITH SQUAMOUS METAPLASIA AND CHRONIC CERVICITIS.    Imaging (Pelvic Ultrasound 07/18/2016):  EXAM: TRANSABDOMINAL AND TRANSVAGINAL ULTRASOUND OF PELVIS TECHNIQUE COMPARISON:  No priors. FINDINGS: Uterus Measurements: 5.6 x 3.2 x 4.3 cm. 7 x 6 x 9 mm heterogeneously hypoechoic lesion in the region of the cervix, appears have internal blood flow. No fibroids or other mass visualized. Endometrium Thickness: 12 mm.  No focal abnormality visualized. Right ovary Measurements: 1.4 x 1.0 x 1.3 cm. Normal appearance/no adnexal mass. Left ovary Could not be visualized secondary to overlying bowel gas. Other findings No abnormal free fluid. IMPRESSION: 1. Irregular heterogeneously hypoechoic lesion in the region of the cervical os which appears to have internal blood flow. This is  of uncertain etiology and significance, but differentials considerations include a prolapsing endometrial polyp, or primary cervical neoplasm. Correlation with physical examination is recommended. 2. Diffusely thickened endometrium (12 mm). In the setting of post-menopausal bleeding, endometrial sampling is indicated to exclude carcinoma. If results are benign, sonohysterogram should be considered for focal lesion work-up. (Ref: Radiological Reasoning: Algorithmic Workup of Abnormal Vaginal Bleeding with Endovaginal Sonography and Sonohysterography. AJR 2008; 749:S49-67). 3. Limited evaluation which could not visualize the left ovary secondary to overlying bowel gas. 4. Right ovary was normal in appearance.   Assessment:   Endometrial carcinoma (high grade serous papillary type) Possible cervical lesion cervical (polyp vs neoplasm) Elevated BP (with h/o HTN)  Plan:   - Discussion had with patient regarding biopsy and ultrasound results. Endometrial biopsy noting carcinoma of the high-grade serous papillary type. Pap smear was negative however ultrasound did note a possible cervical mass (cannot differentiate polyp versus neoplasm). Will have patient referred to GYN oncology for futher discussion of management options, however briefly discussed that patient would likely need further imaging, and that she may engage in discussion of hysterectomy with Oncologist.  Also discussed possibility of needing additional therapy (i.e. Chemotherapy).  Patient notes understanding. Handled information well.  Her husband was also present with her for discussion of results. All questions answered. - Elevated BP. Patient with h/o HTN, notes compliance with medications.  States that she was nervous today.    A total of 30 minutes were spent face-to-face with the patient during this encounter and over half of that time dealt with counseling and coordination of care.  Rubie Maid, MD [facility]

## 2016-08-05 DIAGNOSIS — I350 Nonrheumatic aortic (valve) stenosis: Secondary | ICD-10-CM | POA: Diagnosis not present

## 2016-08-05 DIAGNOSIS — I6523 Occlusion and stenosis of bilateral carotid arteries: Secondary | ICD-10-CM | POA: Diagnosis not present

## 2016-08-05 DIAGNOSIS — I10 Essential (primary) hypertension: Secondary | ICD-10-CM | POA: Diagnosis not present

## 2016-08-05 DIAGNOSIS — K760 Fatty (change of) liver, not elsewhere classified: Secondary | ICD-10-CM | POA: Diagnosis not present

## 2016-08-05 DIAGNOSIS — C541 Malignant neoplasm of endometrium: Secondary | ICD-10-CM | POA: Diagnosis not present

## 2016-08-05 DIAGNOSIS — K219 Gastro-esophageal reflux disease without esophagitis: Secondary | ICD-10-CM | POA: Diagnosis not present

## 2016-08-05 DIAGNOSIS — E119 Type 2 diabetes mellitus without complications: Secondary | ICD-10-CM | POA: Diagnosis not present

## 2016-08-09 DIAGNOSIS — I10 Essential (primary) hypertension: Secondary | ICD-10-CM | POA: Diagnosis not present

## 2016-08-09 DIAGNOSIS — E782 Mixed hyperlipidemia: Secondary | ICD-10-CM | POA: Diagnosis not present

## 2016-08-09 DIAGNOSIS — E119 Type 2 diabetes mellitus without complications: Secondary | ICD-10-CM | POA: Diagnosis not present

## 2016-08-09 DIAGNOSIS — E669 Obesity, unspecified: Secondary | ICD-10-CM | POA: Diagnosis not present

## 2016-08-09 DIAGNOSIS — R69 Illness, unspecified: Secondary | ICD-10-CM | POA: Diagnosis not present

## 2016-08-09 DIAGNOSIS — I6529 Occlusion and stenosis of unspecified carotid artery: Secondary | ICD-10-CM | POA: Diagnosis not present

## 2016-08-09 DIAGNOSIS — E876 Hypokalemia: Secondary | ICD-10-CM | POA: Diagnosis not present

## 2016-08-09 DIAGNOSIS — C541 Malignant neoplasm of endometrium: Secondary | ICD-10-CM | POA: Diagnosis not present

## 2016-08-09 DIAGNOSIS — C548 Malignant neoplasm of overlapping sites of corpus uteri: Secondary | ICD-10-CM | POA: Diagnosis not present

## 2016-08-09 DIAGNOSIS — N952 Postmenopausal atrophic vaginitis: Secondary | ICD-10-CM | POA: Diagnosis not present

## 2016-08-09 DIAGNOSIS — G4700 Insomnia, unspecified: Secondary | ICD-10-CM | POA: Diagnosis not present

## 2016-08-09 DIAGNOSIS — D251 Intramural leiomyoma of uterus: Secondary | ICD-10-CM | POA: Diagnosis not present

## 2016-08-09 DIAGNOSIS — Z6834 Body mass index (BMI) 34.0-34.9, adult: Secondary | ICD-10-CM | POA: Diagnosis not present

## 2016-08-18 ENCOUNTER — Ambulatory Visit: Payer: Medicare HMO | Admitting: Obstetrics and Gynecology

## 2016-08-19 DIAGNOSIS — C541 Malignant neoplasm of endometrium: Secondary | ICD-10-CM | POA: Diagnosis not present

## 2016-08-23 ENCOUNTER — Encounter: Payer: Medicare HMO | Admitting: Obstetrics and Gynecology

## 2016-08-25 DIAGNOSIS — C541 Malignant neoplasm of endometrium: Secondary | ICD-10-CM | POA: Diagnosis not present

## 2016-08-28 ENCOUNTER — Other Ambulatory Visit: Payer: Self-pay | Admitting: Internal Medicine

## 2016-09-01 DIAGNOSIS — Z5111 Encounter for antineoplastic chemotherapy: Secondary | ICD-10-CM | POA: Diagnosis not present

## 2016-09-01 DIAGNOSIS — T451X5A Adverse effect of antineoplastic and immunosuppressive drugs, initial encounter: Secondary | ICD-10-CM | POA: Diagnosis not present

## 2016-09-01 DIAGNOSIS — R112 Nausea with vomiting, unspecified: Secondary | ICD-10-CM | POA: Diagnosis not present

## 2016-09-01 DIAGNOSIS — C541 Malignant neoplasm of endometrium: Secondary | ICD-10-CM | POA: Diagnosis not present

## 2016-09-05 DIAGNOSIS — R112 Nausea with vomiting, unspecified: Secondary | ICD-10-CM | POA: Insufficient documentation

## 2016-09-05 DIAGNOSIS — T451X5A Adverse effect of antineoplastic and immunosuppressive drugs, initial encounter: Secondary | ICD-10-CM

## 2016-09-05 DIAGNOSIS — Z5111 Encounter for antineoplastic chemotherapy: Secondary | ICD-10-CM | POA: Insufficient documentation

## 2016-09-06 ENCOUNTER — Other Ambulatory Visit: Payer: Self-pay | Admitting: Internal Medicine

## 2016-09-06 ENCOUNTER — Telehealth: Payer: Self-pay

## 2016-09-06 DIAGNOSIS — E119 Type 2 diabetes mellitus without complications: Secondary | ICD-10-CM

## 2016-09-06 NOTE — Telephone Encounter (Signed)
CPE scheduled for 09/14/2016 needs canceled. She has Dx of cancer and will be busy with chemo. Wants A1C ordered if that's ok. [phone]

## 2016-09-06 NOTE — Telephone Encounter (Signed)
Order placed.  Just need to come by the front desk to have orders released when ready to get it done.

## 2016-09-07 DIAGNOSIS — Z5111 Encounter for antineoplastic chemotherapy: Secondary | ICD-10-CM | POA: Diagnosis not present

## 2016-09-07 DIAGNOSIS — C541 Malignant neoplasm of endometrium: Secondary | ICD-10-CM | POA: Diagnosis not present

## 2016-09-09 DIAGNOSIS — R300 Dysuria: Secondary | ICD-10-CM | POA: Diagnosis not present

## 2016-09-14 ENCOUNTER — Encounter: Payer: Medicare HMO | Admitting: Internal Medicine

## 2016-09-14 DIAGNOSIS — C549 Malignant neoplasm of corpus uteri, unspecified: Secondary | ICD-10-CM | POA: Diagnosis not present

## 2016-09-14 DIAGNOSIS — C541 Malignant neoplasm of endometrium: Secondary | ICD-10-CM | POA: Diagnosis not present

## 2016-09-20 ENCOUNTER — Ambulatory Visit (INDEPENDENT_AMBULATORY_CARE_PROVIDER_SITE_OTHER): Payer: Medicare HMO | Admitting: Internal Medicine

## 2016-09-20 ENCOUNTER — Encounter: Payer: Self-pay | Admitting: Internal Medicine

## 2016-09-20 VITALS — BP 140/80 | HR 71 | Resp 16 | Ht 62.5 in | Wt 180.0 lb

## 2016-09-20 DIAGNOSIS — E119 Type 2 diabetes mellitus without complications: Secondary | ICD-10-CM | POA: Diagnosis not present

## 2016-09-20 DIAGNOSIS — R21 Rash and other nonspecific skin eruption: Secondary | ICD-10-CM

## 2016-09-20 MED ORDER — METHYLPREDNISOLONE 4 MG PO TBPK: ORAL_TABLET | ORAL | 0 refills | Status: DC

## 2016-09-20 NOTE — Progress Notes (Signed)
Date:  09/20/2016   Name:  Samantha Lang   DOB:  06/08/1941   MRN:  [id number]   Chief Complaint: Rash (arms and chest x 3 days) Rash  This is a new problem. The current episode started in the past 7 days. The problem has been gradually worsening since onset. The rash is diffuse. The rash is characterized by redness. She was exposed to nothing. Pertinent negatives include no cough, fever or vomiting. Past treatments include antihistamine. The treatment provided no relief.  She had Chemotherapy 2 weeks ago and wonders if it is the cause.    Review of Systems  Constitutional: Negative for chills and fever.  Respiratory: Negative for cough.   Cardiovascular: Negative for chest pain and palpitations.  Gastrointestinal: Negative for abdominal pain, nausea and vomiting.  Genitourinary: Negative for dysuria.  Musculoskeletal: Negative for arthralgias.  Skin: Positive for rash.    Patient Active Problem List   Diagnosis Date Noted  . Chemotherapy management, encounter for 09/05/2016  . CINV (chemotherapy-induced nausea and vomiting) 09/05/2016  . Uterine cancer ([place]) 08/03/2016  . Endometrial cancer ([place]) 08/03/2016  . Edema leg 04/13/2016  . DM type 2 without retinopathy ([place]) 03/14/2016  . Shoulder pain, left 02/12/2016  . Acid reflux 10/02/2015  . 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 Hyperlipidemia associated with type 2 diabetes mellitus ([place]) 04/08/2015  . Aortic heart valve narrowing 03/26/2015  . Carotid artery narrowing 03/26/2015  . 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 Polyarthralgia 09/27/2011  . Anxiety   . Environmental and seasonal allergies   . Postmenopausal atrophic vaginitis   . Depression   . 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 Essential hypertension     Prior to Admission medications   Medication Sig Start Date End Date Taking? Authorizing  Provider  amLODipine (NORVASC) 5 MG tablet Take 5 mg by mouth daily.      Historical Provider, MD  aspirin 81 MG tablet Take 81 mg by mouth daily.    Historical Provider, MD  atorvastatin (LIPITOR) 10 MG tablet Take 1 tablet by mouth daily. 07/12/15   Historical Provider, MD  carvedilol (COREG) 3.125 MG tablet Take 1 tablet by mouth 2 (two) times daily. 01/20/16 01/19/17  Historical Provider, MD  diazepam (VALIUM) 5 MG tablet Take 1 tablet (5 mg total) by mouth at bedtime as needed. 07/26/16   Glean Hess, MD  JANUVIA 100 MG tablet TAKE 1 TABLET BY MOUTH EVERY DAY 03/19/16   Glean Hess, MD  pantoprazole (PROTONIX) 40 MG tablet Take 1 tablet (40 mg total) by mouth daily. 07/15/16   Glean Hess, MD  telmisartan-hydrochlorothiazide (MICARDIS HCT) 80-25 MG tablet Take 1 tablet by mouth daily. 04/13/16 04/13/17  Historical Provider, MD  venlafaxine XR (EFFEXOR-XR) 150 MG 24 hr capsule TAKE 1 CAPSULE BY MOUTH DAILY 08/28/16   Glean Hess, MD    Allergies  Allergen Reactions  . Amlodipine Swelling    Higher doses caused swelling   . Cephalexin   . Clarithromycin   . Lipitor [Atorvastatin Calcium]     Legs ache  With higher dose    Past Surgical History:  Procedure Laterality Date  . BREAST BIOPSY Right 03/21/08   right, benign   . EYE SURGERY  Oct 2009   for ptosis,  Dr. Rosaria Ferries, eyelid lift  . TUBAL LIGATION      Social History  Substance  Use Topics  . Smoking status: Former Smoker    Quit date: 09/25/1988  . Smokeless tobacco: Never Used  . Alcohol use 0.0 oz/week     Comment: daily- beer,wine,liquor     Medication list has been reviewed and updated.   Physical Exam  Constitutional: She appears well-developed and well-nourished. No distress.  Neck: Normal range of motion. Neck supple.  Cardiovascular: Normal rate, regular rhythm and normal heart sounds.   Pulmonary/Chest: Effort normal and breath sounds normal.  Skin: She is not diaphoretic.  Maculopapular drug  eruption over chest, back, arms and face c/w drug eruption.  Psychiatric: She has a normal mood and affect. Her speech is normal.    BP 140/80   Pulse 71   Resp 16   Ht 5' 2.5" (1.588 m)   Wt 180 lb (81.6 kg)   SpO2 98%   BMI 32.40 kg/m   Assessment and Plan: 1. Rash and nonspecific skin eruption - methylPREDNISolone (MEDROL DOSEPAK) 4 MG TBPK tablet; Take 6 pills on day 1 the 5 pills day 2 then 4 pills day 3 then 3 pills day 4 then 2 pills day 5 then one pills day 6 then stop  Dispense: 21 tablet; Refill: 0  2. DM type 2 without retinopathy ([place]) - Hemoglobin A1c   Halina Maidens, MD [facility]  09/20/2016

## 2016-09-21 LAB — HEMOGLOBIN A1C
Est. average glucose Bld gHb Est-mCnc: 174 mg/dL
Hgb A1c MFr Bld: 7.7 % — ABNORMAL HIGH (ref 4.8–5.6)

## 2016-09-22 ENCOUNTER — Encounter: Payer: Self-pay | Admitting: Internal Medicine

## 2016-09-26 ENCOUNTER — Encounter: Payer: Self-pay | Admitting: Internal Medicine

## 2016-09-26 ENCOUNTER — Ambulatory Visit (INDEPENDENT_AMBULATORY_CARE_PROVIDER_SITE_OTHER): Payer: Medicare HMO | Admitting: Internal Medicine

## 2016-09-26 VITALS — BP 138/70 | HR 79 | Resp 16 | Ht 62.5 in | Wt 176.0 lb

## 2016-09-26 DIAGNOSIS — R21 Rash and other nonspecific skin eruption: Secondary | ICD-10-CM

## 2016-09-26 NOTE — Progress Notes (Signed)
Date:  09/26/2016   Name:  Samantha Lang   DOB:  01-01-1941   MRN:  [id number]   Chief Complaint: Rash (Follow up rash all over.Wants Flu shot if cleared. ) Rash is slightly better - less acute looking.  No longer itching since taking prednisone.  She has no new lesions.  She is anticipating chemoTx this week and wonders about getting a flu vaccine.    Review of Systems  Constitutional: Positive for fatigue. Negative for chills, fever and unexpected weight change.  Respiratory: Negative for cough, chest tightness and shortness of breath.   Cardiovascular: Negative for chest pain.  Skin: Positive for color change and rash.    Patient Active Problem List   Diagnosis Date Noted  . Chemotherapy management, encounter for 09/05/2016  . CINV (chemotherapy-induced nausea and vomiting) 09/05/2016  . Uterine cancer ([place]) 08/03/2016  . Endometrial cancer ([place]) 08/03/2016  . Edema leg 04/13/2016  . DM type 2 without retinopathy ([place]) 03/14/2016  . Shoulder pain, left 02/12/2016  . Acid reflux 10/02/2015  . 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 Hyperlipidemia associated with type 2 diabetes mellitus ([place]) 04/08/2015  . Aortic heart valve narrowing 03/26/2015  . Carotid artery narrowing 03/26/2015  . 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 Polyarthralgia 09/27/2011  . Anxiety   . Environmental and seasonal allergies   . Postmenopausal atrophic vaginitis   . Depression   . 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 Essential hypertension     Prior to Admission medications   Medication Sig Start Date End Date Taking? Authorizing Provider  amLODipine (NORVASC) 5 MG tablet TAKE 1 TABLET(5 MG) BY MOUTH EVERY DAY 08/23/16  Yes Historical Provider, MD  aspirin 81 MG tablet Take 81 mg by mouth daily.   Yes Historical Provider, MD  atorvastatin (LIPITOR) 10 MG tablet Take 1 tablet by mouth  daily. 07/12/15  Yes Historical Provider, MD  carvedilol (COREG) 3.125 MG tablet Take 1 tablet by mouth 2 (two) times daily. 01/20/16 01/19/17 Yes Historical Provider, MD  dexamethasone (DECADRON) 4 MG tablet Take 8 mg (2 x 4mg  tablets) by mouth at 8am & 5pm with food for 2 days after chemo each cycle, then as directed. 09/05/16  Yes Historical Provider, MD  diazepam (VALIUM) 5 MG tablet Take 1 tablet (5 mg total) by mouth at bedtime as needed. 07/26/16  Yes Glean Hess, MD  JANUVIA 100 MG tablet TAKE 1 TABLET BY MOUTH EVERY DAY 03/19/16  Yes Glean Hess, MD  pantoprazole (PROTONIX) 40 MG tablet Take 1 tablet (40 mg total) by mouth daily. 07/15/16  Yes Glean Hess, MD  telmisartan-hydrochlorothiazide (MICARDIS HCT) 80-25 MG tablet Take 1 tablet by mouth daily. 04/13/16 04/13/17 Yes Historical Provider, MD  venlafaxine XR (EFFEXOR-XR) 150 MG 24 hr capsule TAKE 1 CAPSULE BY MOUTH DAILY 08/28/16  Yes Glean Hess, MD    Allergies  Allergen Reactions  . Amlodipine Swelling    Higher doses caused swelling   . Cephalexin   . Clarithromycin   . Lipitor [Atorvastatin Calcium]     Legs ache  With higher dose    Past Surgical History:  Procedure Laterality Date  . BREAST BIOPSY Right 03/21/08   right, benign   . EYE SURGERY  Oct 2009   for ptosis,  Dr. Rosaria Ferries, eyelid lift  . TUBAL LIGATION      Social History  Substance  Use Topics  . Smoking status: Former Smoker    Quit date: 09/25/1988  . Smokeless tobacco: Never Used  . Alcohol use 0.0 oz/week     Comment: daily- beer,wine,liquor     Medication list has been reviewed and updated.   Physical Exam  Constitutional: She appears well-developed and well-nourished. No distress.  Neck: Normal range of motion. Neck supple.  Cardiovascular: Normal rate, regular rhythm and normal heart sounds.   Pulmonary/Chest: Effort normal and breath sounds normal.  Skin: Skin is warm and dry. Rash noted. She is not diaphoretic.  Maculopapular  drug eruption over chest, back, arms and face c/w drug eruption.  Psychiatric: She has a normal mood and affect. Her speech is normal.    BP 138/70   Pulse 79   Resp 16   Ht 5' 2.5" (1.588 m)   Wt 176 lb (79.8 kg)   SpO2 96%   BMI 31.68 kg/m   Assessment and Plan: 1. Rash and nonspecific skin eruption Improved - discuss flu vaccine timing with Oncology   Halina Maidens, MD [facility]  09/26/2016

## 2016-09-29 DIAGNOSIS — E782 Mixed hyperlipidemia: Secondary | ICD-10-CM | POA: Diagnosis not present

## 2016-09-29 DIAGNOSIS — R21 Rash and other nonspecific skin eruption: Secondary | ICD-10-CM | POA: Diagnosis not present

## 2016-09-29 DIAGNOSIS — Z5111 Encounter for antineoplastic chemotherapy: Secondary | ICD-10-CM | POA: Diagnosis not present

## 2016-09-29 DIAGNOSIS — I2510 Atherosclerotic heart disease of native coronary artery without angina pectoris: Secondary | ICD-10-CM | POA: Diagnosis not present

## 2016-09-29 DIAGNOSIS — T451X5A Adverse effect of antineoplastic and immunosuppressive drugs, initial encounter: Secondary | ICD-10-CM | POA: Diagnosis not present

## 2016-09-29 DIAGNOSIS — I10 Essential (primary) hypertension: Secondary | ICD-10-CM | POA: Diagnosis not present

## 2016-09-29 DIAGNOSIS — R32 Unspecified urinary incontinence: Secondary | ICD-10-CM | POA: Diagnosis not present

## 2016-09-29 DIAGNOSIS — R350 Frequency of micturition: Secondary | ICD-10-CM | POA: Diagnosis not present

## 2016-09-29 DIAGNOSIS — R112 Nausea with vomiting, unspecified: Secondary | ICD-10-CM | POA: Diagnosis not present

## 2016-09-29 DIAGNOSIS — C541 Malignant neoplasm of endometrium: Secondary | ICD-10-CM | POA: Diagnosis not present

## 2016-09-29 DIAGNOSIS — E119 Type 2 diabetes mellitus without complications: Secondary | ICD-10-CM | POA: Diagnosis not present

## 2016-09-29 DIAGNOSIS — Z90710 Acquired absence of both cervix and uterus: Secondary | ICD-10-CM | POA: Diagnosis not present

## 2016-10-01 ENCOUNTER — Other Ambulatory Visit: Payer: Self-pay | Admitting: Internal Medicine

## 2016-10-04 DIAGNOSIS — C541 Malignant neoplasm of endometrium: Secondary | ICD-10-CM | POA: Diagnosis not present

## 2016-10-04 DIAGNOSIS — Z79899 Other long term (current) drug therapy: Secondary | ICD-10-CM | POA: Diagnosis not present

## 2016-10-04 DIAGNOSIS — C549 Malignant neoplasm of corpus uteri, unspecified: Secondary | ICD-10-CM | POA: Diagnosis not present

## 2016-10-05 ENCOUNTER — Other Ambulatory Visit: Payer: Self-pay | Admitting: Internal Medicine

## 2016-10-07 DIAGNOSIS — C541 Malignant neoplasm of endometrium: Secondary | ICD-10-CM | POA: Diagnosis not present

## 2016-10-07 DIAGNOSIS — Z79899 Other long term (current) drug therapy: Secondary | ICD-10-CM | POA: Diagnosis not present

## 2016-10-07 DIAGNOSIS — C549 Malignant neoplasm of corpus uteri, unspecified: Secondary | ICD-10-CM | POA: Diagnosis not present

## 2016-10-11 DIAGNOSIS — C541 Malignant neoplasm of endometrium: Secondary | ICD-10-CM | POA: Diagnosis not present

## 2016-10-11 DIAGNOSIS — Z79899 Other long term (current) drug therapy: Secondary | ICD-10-CM | POA: Diagnosis not present

## 2016-10-11 DIAGNOSIS — C549 Malignant neoplasm of corpus uteri, unspecified: Secondary | ICD-10-CM | POA: Diagnosis not present

## 2016-10-14 DIAGNOSIS — C549 Malignant neoplasm of corpus uteri, unspecified: Secondary | ICD-10-CM | POA: Diagnosis not present

## 2016-10-14 DIAGNOSIS — Z79899 Other long term (current) drug therapy: Secondary | ICD-10-CM | POA: Diagnosis not present

## 2016-10-14 DIAGNOSIS — C541 Malignant neoplasm of endometrium: Secondary | ICD-10-CM | POA: Diagnosis not present

## 2016-10-18 DIAGNOSIS — C549 Malignant neoplasm of corpus uteri, unspecified: Secondary | ICD-10-CM | POA: Diagnosis not present

## 2016-10-18 DIAGNOSIS — Z79899 Other long term (current) drug therapy: Secondary | ICD-10-CM | POA: Diagnosis not present

## 2016-10-18 DIAGNOSIS — C541 Malignant neoplasm of endometrium: Secondary | ICD-10-CM | POA: Diagnosis not present

## 2016-10-19 ENCOUNTER — Other Ambulatory Visit: Payer: Self-pay | Admitting: Internal Medicine

## 2016-10-20 ENCOUNTER — Other Ambulatory Visit: Payer: Self-pay | Admitting: Internal Medicine

## 2016-10-20 DIAGNOSIS — K219 Gastro-esophageal reflux disease without esophagitis: Secondary | ICD-10-CM | POA: Diagnosis not present

## 2016-10-20 DIAGNOSIS — R202 Paresthesia of skin: Secondary | ICD-10-CM | POA: Diagnosis not present

## 2016-10-20 DIAGNOSIS — Z5111 Encounter for antineoplastic chemotherapy: Secondary | ICD-10-CM | POA: Diagnosis not present

## 2016-10-20 DIAGNOSIS — E119 Type 2 diabetes mellitus without complications: Secondary | ICD-10-CM | POA: Diagnosis not present

## 2016-10-20 DIAGNOSIS — M2550 Pain in unspecified joint: Secondary | ICD-10-CM | POA: Diagnosis not present

## 2016-10-20 DIAGNOSIS — F419 Anxiety disorder, unspecified: Secondary | ICD-10-CM

## 2016-10-20 DIAGNOSIS — I10 Essential (primary) hypertension: Secondary | ICD-10-CM | POA: Diagnosis not present

## 2016-10-20 DIAGNOSIS — R350 Frequency of micturition: Secondary | ICD-10-CM | POA: Diagnosis not present

## 2016-10-20 DIAGNOSIS — T451X5A Adverse effect of antineoplastic and immunosuppressive drugs, initial encounter: Secondary | ICD-10-CM | POA: Diagnosis not present

## 2016-10-20 DIAGNOSIS — C541 Malignant neoplasm of endometrium: Secondary | ICD-10-CM | POA: Diagnosis not present

## 2016-10-20 DIAGNOSIS — E782 Mixed hyperlipidemia: Secondary | ICD-10-CM | POA: Diagnosis not present

## 2016-10-20 DIAGNOSIS — R112 Nausea with vomiting, unspecified: Secondary | ICD-10-CM | POA: Diagnosis not present

## 2016-10-20 DIAGNOSIS — I2510 Atherosclerotic heart disease of native coronary artery without angina pectoris: Secondary | ICD-10-CM | POA: Diagnosis not present

## 2016-10-20 MED ORDER — DIAZEPAM 5 MG PO TABS: 5.0000 mg | ORAL_TABLET | Freq: Every evening | ORAL | 3 refills | Status: DC | PRN

## 2016-11-07 ENCOUNTER — Encounter: Payer: Self-pay | Admitting: Internal Medicine

## 2016-11-07 ENCOUNTER — Ambulatory Visit (INDEPENDENT_AMBULATORY_CARE_PROVIDER_SITE_OTHER): Payer: Medicare HMO | Admitting: Internal Medicine

## 2016-11-07 VITALS — BP 132/80 | HR 72 | Resp 16 | Ht 62.5 in | Wt 177.0 lb

## 2016-11-07 DIAGNOSIS — Z23 Encounter for immunization: Secondary | ICD-10-CM | POA: Diagnosis not present

## 2016-11-07 DIAGNOSIS — E119 Type 2 diabetes mellitus without complications: Secondary | ICD-10-CM

## 2016-11-07 DIAGNOSIS — C541 Malignant neoplasm of endometrium: Secondary | ICD-10-CM

## 2016-11-07 NOTE — Progress Notes (Signed)
Date:  11/07/2016   Name:  Samantha Lang   DOB:  03-03-1941   MRN:  [id number]   Chief Complaint: Diabetes (Has not had Januvia in a week can not pay $ 180 wants samples or new RX ) Januvia was costing $45/mo which she could manage.  Now in the donut hole and it is $100.  Between extra cost for her cancer treatment and this, she can no longer afford Januvia. She has papers to apply for patient assistance year round but picked up the wrong ones at home.  She will complete these and bring them by for me to sign off.    Review of Systems  Constitutional: Positive for fatigue. Negative for chills and fever.  Respiratory: Negative for chest tightness and shortness of breath.   Cardiovascular: Negative for chest pain.  Neurological: Negative for dizziness and headaches.  Hematological: Negative for adenopathy. Bruises/bleeds easily.    Patient Active Problem List   Diagnosis Date Noted  . Chemotherapy management, encounter for 09/05/2016  . CINV (chemotherapy-induced nausea and vomiting) 09/05/2016  . Uterine cancer ([place]) 08/03/2016  . Endometrial cancer ([place]) 08/03/2016  . Edema leg 04/13/2016  . 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 Shoulder pain, left 02/12/2016  . Acid reflux 10/02/2015  . 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 Hyperlipidemia associated with type 2 diabetes mellitus ([place]) 04/08/2015  . Aortic heart valve narrowing 03/26/2015  . Carotid artery narrowing 03/26/2015  . 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 Polyarthralgia 09/27/2011  . Anxiety   . Environmental and seasonal allergies   . Postmenopausal atrophic vaginitis   . Depression   . 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 Essential hypertension     Prior to Admission medications   Medication Sig Start Date End Date Taking? Authorizing Provider  amLODipine (NORVASC) 5 MG tablet TAKE  1 TABLET(5 MG) BY MOUTH EVERY DAY 08/23/16  Yes Historical Provider, MD  aspirin 81 MG tablet Take 81 mg by mouth daily.   Yes Historical Provider, MD  atorvastatin (LIPITOR) 10 MG tablet Take 1 tablet by mouth daily. 07/12/15  Yes Historical Provider, MD  carvedilol (COREG) 3.125 MG tablet TAKE 1 TABLET(3.125 MG) BY MOUTH TWICE DAILY WITH MEALS 10/20/16  Yes Historical Provider, MD  diazepam (VALIUM) 5 MG tablet Take 1 tablet (5 mg total) by mouth at bedtime as needed. 10/20/16  Yes Glean Hess, MD  JANUVIA 100 MG tablet TAKE 1 TABLET BY MOUTH EVERY DAY 10/20/16  Yes Glean Hess, MD  pantoprazole (PROTONIX) 40 MG tablet TAKE 1 TABLET(40 MG) BY MOUTH DAILY 10/05/16  Yes Glean Hess, MD  telmisartan-hydrochlorothiazide (MICARDIS HCT) 80-25 MG tablet Take 1 tablet by mouth daily. 04/13/16 04/13/17 Yes Historical Provider, MD  venlafaxine XR (EFFEXOR-XR) 150 MG 24 hr capsule TAKE 1 CAPSULE BY MOUTH DAILY 10/01/16  Yes Glean Hess, MD    Allergies  Allergen Reactions  . Amlodipine Swelling    Higher doses caused swelling   . Cephalexin   . Clarithromycin   . Lipitor [Atorvastatin Calcium]     Legs ache  With higher dose    Past Surgical History:  Procedure Laterality Date  . BREAST BIOPSY Right 03/21/08   right, benign   . EYE SURGERY  Oct 2009   for ptosis,  Dr. Rosaria Ferries, eyelid lift  . TUBAL LIGATION      Social  History  Substance Use Topics  . Smoking status: Former Smoker    Quit date: 09/25/1988  . Smokeless tobacco: Never Used  . Alcohol use 0.0 oz/week     Comment: daily- beer,wine,liquor     Medication list has been reviewed and updated.   Physical Exam  Constitutional: She is oriented to person, place, and time. She appears well-developed. No distress.  HENT:  Head: Normocephalic and atraumatic.  Cardiovascular: Normal rate, regular rhythm and normal heart sounds.   Pulmonary/Chest: Effort normal and breath sounds normal. No respiratory distress. She  has no decreased breath sounds. She has no wheezes.  Musculoskeletal: Normal range of motion.  Neurological: She is alert and oriented to person, place, and time.  Skin: Skin is warm and dry. Ecchymosis (forearms) noted.  Psychiatric: She has a normal mood and affect. Her behavior is normal. Thought content normal.  Nursing note and vitals reviewed.   BP 132/80   Pulse 72   Resp 16   Ht 5' 2.5" (1.588 m)   Wt 177 lb (80.3 kg)   SpO2 98%   BMI 31.86 kg/m   Assessment and Plan: 1. Encounter for immunization - Flu Vaccine QUAD 36+ mos IM  2. 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 Endometrial cancer [facility]) Doing fairly well with chemotherapy  Halina Maidens, MD [facility]  11/07/2016

## 2016-11-14 DIAGNOSIS — C541 Malignant neoplasm of endometrium: Secondary | ICD-10-CM | POA: Diagnosis not present

## 2016-12-06 DIAGNOSIS — I350 Nonrheumatic aortic (valve) stenosis: Secondary | ICD-10-CM | POA: Diagnosis not present

## 2016-12-06 DIAGNOSIS — I6523 Occlusion and stenosis of bilateral carotid arteries: Secondary | ICD-10-CM | POA: Diagnosis not present

## 2016-12-07 ENCOUNTER — Ambulatory Visit (INDEPENDENT_AMBULATORY_CARE_PROVIDER_SITE_OTHER): Payer: Medicare HMO | Admitting: Internal Medicine

## 2016-12-07 ENCOUNTER — Encounter: Payer: Self-pay | Admitting: Internal Medicine

## 2016-12-07 VITALS — BP 160/86 | HR 82 | Temp 97.6°F | Ht 62.5 in | Wt 174.0 lb

## 2016-12-07 DIAGNOSIS — G620 Drug-induced polyneuropathy: Secondary | ICD-10-CM | POA: Diagnosis not present

## 2016-12-07 DIAGNOSIS — M7062 Trochanteric bursitis, left hip: Secondary | ICD-10-CM

## 2016-12-07 DIAGNOSIS — T451X5A Adverse effect of antineoplastic and immunosuppressive drugs, initial encounter: Secondary | ICD-10-CM | POA: Diagnosis not present

## 2016-12-07 DIAGNOSIS — I10 Essential (primary) hypertension: Secondary | ICD-10-CM

## 2016-12-07 HISTORY — DX: Trochanteric bursitis, left hip: M70.62

## 2016-12-07 NOTE — Patient Instructions (Signed)
Ibuprofen 600 mg three times a day for 5-7 days.

## 2016-12-07 NOTE — Progress Notes (Signed)
Date:  12/07/2016   Name:  Samantha Lang   DOB:  12-08-1941   MRN:  [id number]   Chief Complaint: Hip Pain (pt stated left hip pain) Hip Pain   There was no injury mechanism. The pain is present in the left hip. The quality of the pain is described as aching. The pain is mild. The symptoms are aggravated by movement. She has tried NSAIDs for the symptoms. The treatment provided mild relief.   Neuropathy - having tingling in finger tips now from chemotherapy.  She was advised to start B12 and B6.   Review of Systems  Constitutional: Negative for chills, fatigue and fever.  Respiratory: Negative for chest tightness and shortness of breath.   Cardiovascular: Negative for chest pain.  Gastrointestinal: Negative for abdominal pain.  Musculoskeletal: Arthralgias: left hip.    Patient Active Problem List   Diagnosis Date Noted  . Trochanteric bursitis of left hip 12/07/2016  . 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 Chemotherapy management, encounter for 09/05/2016  . CINV (chemotherapy-induced nausea and vomiting) 09/05/2016  . Endometrial cancer ([place]) 08/03/2016  . Edema leg 04/13/2016  . 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 Shoulder pain, left 02/12/2016  . Acid reflux 10/02/2015  . 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 Hyperlipidemia associated with type 2 diabetes mellitus ([place]) 04/08/2015  . Aortic heart valve narrowing 03/26/2015  . Carotid artery narrowing 03/26/2015  . 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 Polyarthralgia 09/27/2011  . Anxiety   . Environmental and seasonal allergies   . Postmenopausal atrophic vaginitis   . Depression   . 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 Essential hypertension     Prior to Admission medications   Medication Sig Start Date End Date Taking? Authorizing Provider  amLODipine (NORVASC)  5 MG tablet TAKE 1 TABLET(5 MG) BY MOUTH EVERY DAY 08/23/16  Yes Historical Provider, MD  aspirin 81 MG tablet Take 81 mg by mouth daily.   Yes Historical Provider, MD  atorvastatin (LIPITOR) 10 MG tablet Take 1 tablet by mouth daily. 07/12/15  Yes Historical Provider, MD  carvedilol (COREG) 3.125 MG tablet TAKE 1 TABLET(3.125 MG) BY MOUTH TWICE DAILY WITH MEALS 10/20/16  Yes Historical Provider, MD  Cyanocobalamin (VITAMIN B-12 CR PO) Take by mouth.   Yes Historical Provider, MD  Cyanocobalamin (VITAMIN B-12 PO) Take by mouth.   Yes Historical Provider, MD  diazepam (VALIUM) 5 MG tablet Take 1 tablet (5 mg total) by mouth at bedtime as needed. 10/20/16  Yes Glean Hess, MD  JANUVIA 100 MG tablet TAKE 1 TABLET BY MOUTH EVERY DAY 10/20/16  Yes Glean Hess, MD  pantoprazole (PROTONIX) 40 MG tablet TAKE 1 TABLET(40 MG) BY MOUTH DAILY 10/05/16  Yes Glean Hess, MD  Pyridoxine HCl (VITAMIN B-6 PO) Take by mouth.   Yes Historical Provider, MD  telmisartan-hydrochlorothiazide (MICARDIS HCT) 80-25 MG tablet Take 1 tablet by mouth daily. 04/13/16 04/13/17 Yes Historical Provider, MD  venlafaxine XR (EFFEXOR-XR) 150 MG 24 hr capsule TAKE 1 CAPSULE BY MOUTH DAILY 10/01/16  Yes Glean Hess, MD    Allergies  Allergen Reactions  . Amlodipine Swelling    Higher doses caused swelling   . Cephalexin   . Clarithromycin   . Lipitor [Atorvastatin Calcium]     Legs ache  With higher dose    Past Surgical History:  Procedure Laterality  Date  . BREAST BIOPSY Right 03/21/08   right, benign   . EYE SURGERY  Oct 2009   for ptosis,  Dr. Rosaria Ferries, eyelid lift  . TUBAL LIGATION      Social History  Substance Use Topics  . Smoking status: Former Smoker    Quit date: 09/25/1988  . Smokeless tobacco: Never Used  . Alcohol use 0.0 oz/week     Comment: daily- beer,wine,liquor     Medication list has been reviewed and updated.   Physical Exam  Constitutional: She appears well-developed.    Cardiovascular: Normal rate and normal heart sounds.   Pulmonary/Chest: Effort normal and breath sounds normal.  Musculoskeletal:       Left hip: She exhibits tenderness.  SLR negative No pain on movement Tender over lateral left hip    BP (!) 160/86   Pulse 82   Temp 97.6 F (36.4 C)   Ht 5' 2.5" (1.588 m)   Wt 174 lb (78.9 kg)   BMI 31.32 kg/m   Assessment and Plan: 1. Trochanteric bursitis of left hip Use ice or heat plus ibuprofen tid for the next week  2. Essential hypertension controlled  3. 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

## 2017-01-17 DIAGNOSIS — E782 Mixed hyperlipidemia: Secondary | ICD-10-CM | POA: Diagnosis not present

## 2017-01-23 ENCOUNTER — Encounter: Payer: Self-pay | Admitting: Internal Medicine

## 2017-01-23 ENCOUNTER — Other Ambulatory Visit: Payer: Self-pay | Admitting: Internal Medicine

## 2017-01-23 MED ORDER — GUAIFENESIN-CODEINE 100-10 MG/5ML PO SYRP: 5.0000 mL | ORAL_SOLUTION | Freq: Three times a day (TID) | ORAL | 0 refills | Status: DC | PRN

## 2017-02-01 DIAGNOSIS — C541 Malignant neoplasm of endometrium: Secondary | ICD-10-CM | POA: Diagnosis not present

## 2017-02-01 DIAGNOSIS — C549 Malignant neoplasm of corpus uteri, unspecified: Secondary | ICD-10-CM | POA: Diagnosis not present

## 2017-02-22 ENCOUNTER — Other Ambulatory Visit: Payer: Self-pay | Admitting: Internal Medicine

## 2017-02-22 ENCOUNTER — Telehealth: Payer: Self-pay

## 2017-02-22 DIAGNOSIS — F419 Anxiety disorder, unspecified: Secondary | ICD-10-CM

## 2017-02-22 MED ORDER — DIAZEPAM 5 MG PO TABS: 5.0000 mg | ORAL_TABLET | Freq: Every evening | ORAL | 2 refills | Status: DC | PRN

## 2017-02-24 ENCOUNTER — Other Ambulatory Visit: Payer: Self-pay | Admitting: Internal Medicine

## 2017-02-24 ENCOUNTER — Encounter: Payer: Self-pay | Admitting: Internal Medicine

## 2017-02-24 DIAGNOSIS — Z1211 Encounter for screening for malignant neoplasm of colon: Secondary | ICD-10-CM

## 2017-02-27 NOTE — Telephone Encounter (Signed)
ERROR

## 2017-03-14 ENCOUNTER — Encounter: Payer: Self-pay | Admitting: Internal Medicine

## 2017-03-14 ENCOUNTER — Ambulatory Visit (INDEPENDENT_AMBULATORY_CARE_PROVIDER_SITE_OTHER): Payer: Medicare HMO | Admitting: Internal Medicine

## 2017-03-14 VITALS — BP 142/64 | HR 70 | Ht 62.0 in | Wt 173.0 lb

## 2017-03-14 DIAGNOSIS — M533 Sacrococcygeal disorders, not elsewhere classified: Secondary | ICD-10-CM | POA: Diagnosis not present

## 2017-03-14 DIAGNOSIS — I10 Essential (primary) hypertension: Secondary | ICD-10-CM

## 2017-03-14 MED ORDER — HYDROCODONE-ACETAMINOPHEN 5-325 MG PO TABS: 1.0000 | ORAL_TABLET | Freq: Every evening | ORAL | 0 refills | Status: DC | PRN

## 2017-03-14 NOTE — Patient Instructions (Signed)
Advil 800 mg tid for several days  Ice 20 minutes 4 times a day  Hydrocodone at bedtime

## 2017-03-14 NOTE — Progress Notes (Signed)
Date:  03/14/2017   Name:  Samantha Lang   DOB:  [birth year]   MRN:  [id number]   Chief Complaint: Back Pain (Right side of lower back hurts. Pt thinks its sciatica. X 2 weeks ago picked up dog and pain has been bad since.When taking a step it hurts worse. Middle of Rt side of back is also giving problem. ) Back Pain  This is a new problem. The current episode started in the past 7 days. The problem occurs intermittently. The pain is present in the sacro-iliac. The quality of the pain is described as burning. The pain does not radiate. The pain is moderate. Pertinent negatives include no chest pain, fever or headaches.      Review of Systems  Constitutional: Negative for chills, fatigue and fever.  Respiratory: Negative for chest tightness.   Cardiovascular: Negative for chest pain and leg swelling.  Gastrointestinal: Negative for diarrhea and rectal pain.  Genitourinary: Negative for difficulty urinating.  Musculoskeletal: Positive for back pain and myalgias.  Neurological: Negative for dizziness and headaches.    Patient Active Problem List   Diagnosis Date Noted  . Trochanteric bursitis of left hip 12/07/2016  . 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 Chemotherapy management, encounter for 09/05/2016  . CINV (chemotherapy-induced nausea and vomiting) 09/05/2016  . Endometrial cancer ([place]) 08/03/2016  . Edema leg 04/13/2016  . 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 Shoulder pain, left 02/12/2016  . Acid reflux 10/02/2015  . 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 Hyperlipidemia associated with type 2 diabetes mellitus ([place]) 04/08/2015  . Aortic heart valve narrowing 03/26/2015  . Carotid artery narrowing 03/26/2015  . 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 Polyarthralgia 09/27/2011  . Anxiety   . Environmental and seasonal allergies   . Postmenopausal atrophic  vaginitis   . Depression   . 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 Essential hypertension     Prior to Admission medications   Medication Sig Start Date End Date Taking? Authorizing Provider  amLODipine (NORVASC) 5 MG tablet TAKE 1 TABLET(5 MG) BY MOUTH EVERY DAY 08/23/16  Yes Historical Provider, MD  aspirin 81 MG tablet Take 81 mg by mouth daily.   Yes Historical Provider, MD  atorvastatin (LIPITOR) 10 MG tablet Take 1 tablet by mouth daily. 07/12/15  Yes Historical Provider, MD  carvedilol (COREG) 3.125 MG tablet TAKE 1 TABLET(3.125 MG) BY MOUTH TWICE DAILY WITH MEALS 10/20/16  Yes Historical Provider, MD  diazepam (VALIUM) 5 MG tablet Take 1 tablet (5 mg total) by mouth at bedtime as needed. 02/22/17  Yes Glean Hess, MD  JANUVIA 100 MG tablet TAKE 1 TABLET BY MOUTH EVERY DAY 10/20/16  Yes Glean Hess, MD  pantoprazole (PROTONIX) 40 MG tablet TAKE 1 TABLET(40 MG) BY MOUTH DAILY 10/05/16  Yes Glean Hess, MD  Pyridoxine HCl (VITAMIN B-6 PO) Take by mouth.   Yes Historical Provider, MD  telmisartan-hydrochlorothiazide (MICARDIS HCT) 80-25 MG tablet Take 1 tablet by mouth daily. 04/13/16 04/13/17 Yes Historical Provider, MD  venlafaxine XR (EFFEXOR-XR) 150 MG 24 hr capsule TAKE 1 CAPSULE BY MOUTH DAILY 10/01/16  Yes Glean Hess, MD    Allergies  Allergen Reactions  . Amlodipine Swelling    Higher doses caused swelling   . Cephalexin   . Clarithromycin   . Lipitor [Atorvastatin Calcium]     Legs ache  With higher dose    Past Surgical History:  Procedure Laterality Date  . BREAST BIOPSY Right 03/21/08   right, benign   . EYE SURGERY  Oct 2009   for ptosis,  Dr. Rosaria Ferries, eyelid lift  . TUBAL LIGATION      Social History  Substance Use Topics  . Smoking status: Former Smoker    Quit date: 09/25/1988  . Smokeless tobacco: Never Used  . Alcohol use 0.0 oz/week     Comment: daily- beer,wine,liquor     Medication list has been  reviewed and updated.   Physical Exam  Constitutional: She is oriented to person, place, and time. She appears well-developed. No distress.  HENT:  Head: Normocephalic and atraumatic.  Cardiovascular: Normal rate, regular rhythm and normal heart sounds.   Pulmonary/Chest: Effort normal and breath sounds normal. No respiratory distress.  Musculoskeletal: Normal range of motion.  Tender over right SI joint Neg SLR    Neurological: She is alert and oriented to person, place, and time. She has normal strength and normal reflexes. No sensory deficit.  Skin: Skin is warm and dry. No rash noted.  Psychiatric: She has a normal mood and affect. Her behavior is normal. Thought content normal.  Nursing note and vitals reviewed.   BP (!) 142/64 (BP Location: Right Arm, Patient Position: Sitting, Cuff Size: Normal)   Pulse 70   Ht 5\' 2"  (1.575 m)   Wt 173 lb (78.5 kg)   SpO2 98%   BMI 31.64 kg/m   Assessment and Plan: 1. Sacroiliac joint pain Continue Advil 800 mg tid Ice 20 minutes 4 times per day Hydrocodone for sleep - HYDROcodone-acetaminophen (NORCO/VICODIN) 5-325 MG tablet; Take 1 tablet by mouth at bedtime as needed for moderate pain.  Dispense: 10 tablet; Refill: 0  2. Essential hypertension controlled   Meds ordered this encounter  Medications  . HYDROcodone-acetaminophen (NORCO/VICODIN) 5-325 MG tablet    Sig: Take 1 tablet by mouth at bedtime as needed for moderate pain.    Dispense:  10 tablet    Refill:  0    Halina Maidens, MD [facility]  03/14/2017

## 2017-03-15 ENCOUNTER — Ambulatory Visit: Payer: Medicare HMO | Admitting: Internal Medicine

## 2017-03-29 DIAGNOSIS — M545 Low back pain: Secondary | ICD-10-CM | POA: Diagnosis not present

## 2017-03-29 DIAGNOSIS — G8929 Other chronic pain: Secondary | ICD-10-CM | POA: Diagnosis not present

## 2017-03-29 DIAGNOSIS — M25552 Pain in left hip: Secondary | ICD-10-CM | POA: Diagnosis not present

## 2017-04-06 DIAGNOSIS — M6281 Muscle weakness (generalized): Secondary | ICD-10-CM | POA: Diagnosis not present

## 2017-04-06 DIAGNOSIS — M545 Low back pain: Secondary | ICD-10-CM | POA: Diagnosis not present

## 2017-04-08 ENCOUNTER — Other Ambulatory Visit: Payer: Self-pay | Admitting: Internal Medicine

## 2017-04-13 DIAGNOSIS — M545 Low back pain: Secondary | ICD-10-CM | POA: Diagnosis not present

## 2017-04-13 DIAGNOSIS — M6281 Muscle weakness (generalized): Secondary | ICD-10-CM | POA: Diagnosis not present

## 2017-04-27 DIAGNOSIS — M545 Low back pain: Secondary | ICD-10-CM | POA: Diagnosis not present

## 2017-04-27 DIAGNOSIS — G8929 Other chronic pain: Secondary | ICD-10-CM | POA: Diagnosis not present

## 2017-04-27 DIAGNOSIS — M7072 Other bursitis of hip, left hip: Secondary | ICD-10-CM | POA: Diagnosis not present

## 2017-05-18 DIAGNOSIS — C541 Malignant neoplasm of endometrium: Secondary | ICD-10-CM | POA: Diagnosis not present

## 2017-05-18 DIAGNOSIS — R69 Illness, unspecified: Secondary | ICD-10-CM | POA: Diagnosis not present

## 2017-06-21 DIAGNOSIS — E782 Mixed hyperlipidemia: Secondary | ICD-10-CM | POA: Diagnosis not present

## 2017-06-21 DIAGNOSIS — I6523 Occlusion and stenosis of bilateral carotid arteries: Secondary | ICD-10-CM | POA: Diagnosis not present

## 2017-06-21 DIAGNOSIS — I350 Nonrheumatic aortic (valve) stenosis: Secondary | ICD-10-CM | POA: Diagnosis not present

## 2017-06-21 DIAGNOSIS — I10 Essential (primary) hypertension: Secondary | ICD-10-CM | POA: Diagnosis not present

## 2017-06-22 ENCOUNTER — Other Ambulatory Visit: Payer: Medicare HMO

## 2017-06-26 ENCOUNTER — Ambulatory Visit: Payer: Medicare HMO | Admitting: Internal Medicine

## 2017-07-13 ENCOUNTER — Other Ambulatory Visit: Payer: Self-pay | Admitting: Internal Medicine

## 2017-07-13 ENCOUNTER — Encounter: Payer: Self-pay | Admitting: Internal Medicine

## 2017-07-13 ENCOUNTER — Ambulatory Visit (INDEPENDENT_AMBULATORY_CARE_PROVIDER_SITE_OTHER): Payer: Medicare HMO | Admitting: Internal Medicine

## 2017-07-13 VITALS — BP 130/62 | HR 62 | Ht 62.0 in | Wt 172.0 lb

## 2017-07-13 DIAGNOSIS — R809 Proteinuria, unspecified: Secondary | ICD-10-CM | POA: Diagnosis not present

## 2017-07-13 DIAGNOSIS — G620 Drug-induced polyneuropathy: Secondary | ICD-10-CM | POA: Diagnosis not present

## 2017-07-13 DIAGNOSIS — E1169 Type 2 diabetes mellitus with other specified complication: Secondary | ICD-10-CM

## 2017-07-13 DIAGNOSIS — I10 Essential (primary) hypertension: Secondary | ICD-10-CM

## 2017-07-13 DIAGNOSIS — T451X5A Adverse effect of antineoplastic and immunosuppressive drugs, initial encounter: Secondary | ICD-10-CM

## 2017-07-13 DIAGNOSIS — E1129 Type 2 diabetes mellitus with other diabetic kidney complication: Secondary | ICD-10-CM | POA: Diagnosis not present

## 2017-07-13 DIAGNOSIS — E119 Type 2 diabetes mellitus without complications: Secondary | ICD-10-CM

## 2017-07-13 DIAGNOSIS — E785 Hyperlipidemia, unspecified: Secondary | ICD-10-CM | POA: Diagnosis not present

## 2017-07-13 LAB — MICROALBUMIN, URINE: MICROALB UR: HIGH

## 2017-07-13 NOTE — Progress Notes (Signed)
Date:  07/13/2017   Name:  Samantha Lang   DOB:  03-Apr-1941   MRN:  [id number]   Chief Complaint: Diabetes (Pt is not fasting. Ate chicken and had coffee. ) Diabetes  She presents for her follow-up diabetic visit. She has type 2 diabetes mellitus. Her disease course has been stable. Pertinent negatives for hypoglycemia include no headaches or tremors. Associated symptoms include fatigue. Pertinent negatives for diabetes include no chest pain, no polydipsia and no polyuria. Symptoms are stable. Diabetic complications include nephropathy (microalbuminuria). An ACE inhibitor/angiotensin II receptor blocker is being taken.  Hypertension  This is a chronic problem. The problem is controlled. Pertinent negatives include no chest pain, headaches, palpitations or shortness of breath. Past treatments include calcium channel blockers, beta blockers, angiotensin blockers and diuretics.  Hyperlipidemia  This is a chronic problem. The problem is controlled. Pertinent negatives include no chest pain or shortness of breath. Current antihyperlipidemic treatment includes statins.  Chemo induced neuropathy - still on B6 but doing much better.  Has only very mild numbness on soles of feet.  Lab Results  Component Value Date   HGBA1C 7.7 (H) 09/20/2016   12/2016 - TC 202, LDL 113, HDL 57, TG 159 @ [facility]     Review of Systems  Constitutional: Positive for fatigue. Negative for appetite change, fever and unexpected weight change.  HENT: Negative for tinnitus and trouble swallowing.   Eyes: Negative for visual disturbance.  Respiratory: Negative for cough, chest tightness and shortness of breath.   Cardiovascular: Negative for chest pain, palpitations and leg swelling.  Gastrointestinal: Negative for abdominal pain.  Endocrine: Negative for polydipsia and polyuria.  Genitourinary: Negative for dysuria and hematuria.  Musculoskeletal: Negative for arthralgias.  Skin: Negative for color change and rash.    Allergic/Immunologic: Negative for environmental allergies.  Neurological: Negative for tremors, numbness and headaches.  Psychiatric/Behavioral: Negative for dysphoric mood.    Patient Active Problem List   Diagnosis Date Noted  . Trochanteric bursitis of left hip 12/07/2016  . 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 Chemotherapy management, encounter for 09/05/2016  . CINV (chemotherapy-induced nausea and vomiting) 09/05/2016  . Endometrial cancer ([place]) 08/03/2016  . Edema leg 04/13/2016  . 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) 03/14/2016  . Shoulder pain, left 02/12/2016  . Acid reflux 10/02/2015  . 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 Hyperlipidemia associated with type 2 diabetes mellitus ([place]) 04/08/2015  . Aortic heart valve narrowing 03/26/2015  . Carotid artery narrowing 03/26/2015  . 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 Polyarthralgia 09/27/2011  . Anxiety   . Environmental and seasonal allergies   . Postmenopausal atrophic vaginitis   . Depression   . 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 Essential hypertension     Prior to Admission medications   Medication Sig Start Date End Date Taking? Authorizing Provider  amLODipine (NORVASC) 5 MG tablet TAKE 1 TABLET(5 MG) BY MOUTH EVERY DAY 08/23/16   [provider]  aspirin 81 MG tablet Take 81 mg by mouth daily.    [provider]  atorvastatin (LIPITOR) 10 MG tablet Take 1 tablet by mouth daily. 07/12/15   [provider]  carvedilol (COREG) 3.125 MG tablet TAKE 1 TABLET(3.125 MG) BY MOUTH TWICE DAILY WITH MEALS 10/20/16   [provider]  diazepam (VALIUM) 5 MG tablet Take 1 tablet (5 mg total) by mouth at bedtime as needed.  02/22/17   Glean Hess, MD  JANUVIA 100 MG tablet TAKE 1 TABLET BY MOUTH EVERY DAY 10/20/16   Glean Hess, MD  pantoprazole  (PROTONIX) 40 MG tablet TAKE 1 TABLET(40 MG) BY MOUTH DAILY 10/05/16   Glean Hess, MD  Pyridoxine HCl (VITAMIN B-6 PO) Take by mouth.    [provider]  telmisartan-hydrochlorothiazide (MICARDIS HCT) 80-25 MG tablet Take 1 tablet by mouth daily. 04/13/16 04/13/17  [provider]  venlafaxine XR (EFFEXOR-XR) 150 MG 24 hr capsule TAKE 1 CAPSULE BY MOUTH DAILY 04/08/17   Glean Hess, MD    Allergies  Allergen Reactions  . Amlodipine Swelling    Higher doses caused swelling   . Cephalexin   . Clarithromycin   . Lipitor [Atorvastatin Calcium]     Legs ache  With higher dose    Past Surgical History:  Procedure Laterality Date  . BREAST BIOPSY Right 03/21/08   right, benign   . EYE SURGERY  Oct 2009   for ptosis,  Dr. Rosaria Ferries, eyelid lift  . TUBAL LIGATION      Social History  Substance Use Topics  . Smoking status: Former Smoker    Quit date: 09/25/1988  . Smokeless tobacco: Never Used  . Alcohol use 0.0 oz/week     Comment: daily- beer,wine,liquor     Medication list has been reviewed and updated.   Physical Exam  Constitutional: She is oriented to person, place, and time. She appears well-developed. No distress.  HENT:  Head: Normocephalic and atraumatic.  Neck: Normal range of motion. Neck supple.  Cardiovascular: Normal rate, regular rhythm and normal heart sounds.   Pulmonary/Chest: Effort normal and breath sounds normal. No respiratory distress. She has no wheezes.  Musculoskeletal: Normal range of motion. She exhibits no edema or tenderness.  Neurological: She is alert and oriented to person, place, and time.  Skin: Skin is warm and dry. No rash noted.  Psychiatric: She has a normal mood and affect. Her behavior is normal. Thought content normal.  Nursing note and vitals reviewed.   BP 130/62   Pulse 62   Ht 5\' 2"  (1.575 m)   Wt 172 lb (78 kg)   SpO2 99%   BMI 31.46 kg/m   Assessment and Plan: 1. 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 Essential hypertension controlled - Comprehensive metabolic panel  3. Hyperlipidemia associated with type 2 diabetes mellitus ([place]) On statin therapy  4. 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 Chemotherapy-induced peripheral neuropathy (H[facility]) Much improved with only mild residual sx Continue B6   No orders of the defined types were placed in this encounter.   Halina Maidens, MD [facility]  07/13/2017

## 2017-07-14 ENCOUNTER — Encounter: Payer: Self-pay | Admitting: Internal Medicine

## 2017-07-14 LAB — COMPREHENSIVE METABOLIC PANEL
ALT: 23 IU/L (ref 0–32)
AST: 20 IU/L (ref 0–40)
Albumin/Globulin Ratio: 2 (ref 1.2–2.2)
Albumin: 4.4 g/dL (ref 3.5–4.8)
Alkaline Phosphatase: 79 IU/L (ref 39–117)
BILIRUBIN TOTAL: 0.6 mg/dL (ref 0.0–1.2)
BUN/Creatinine Ratio: 23 (ref 12–28)
BUN: 18 mg/dL (ref 8–27)
CALCIUM: 9.4 mg/dL (ref 8.7–10.3)
CO2: 23 mmol/L (ref 20–29)
Chloride: 98 mmol/L (ref 96–106)
Creatinine, Ser: 0.77 mg/dL (ref 0.57–1.00)
GFR, EST AFRICAN AMERICAN: 88 mL/min/{1.73_m2} (ref >59)
GFR, EST NON AFRICAN AMERICAN: 76 mL/min/{1.73_m2} (ref >59)
GLOBULIN, TOTAL: 2.2 g/dL (ref 1.5–4.5)
Glucose: 138 mg/dL — ABNORMAL HIGH (ref 65–99)
POTASSIUM: 3.5 mmol/L (ref 3.5–5.2)
SODIUM: 140 mmol/L (ref 134–144)
TOTAL PROTEIN: 6.6 g/dL (ref 6.0–8.5)

## 2017-07-14 LAB — HEMOGLOBIN A1C
Est. average glucose Bld gHb Est-mCnc: 137 mg/dL
Hgb A1c MFr Bld: 6.4 % — ABNORMAL HIGH (ref 4.8–5.6)

## 2017-07-14 NOTE — Progress Notes (Signed)
Pt would like to see Dr. Allen Norris for colonoscopy. Needs referral sent.

## 2017-07-17 ENCOUNTER — Other Ambulatory Visit: Payer: Self-pay | Admitting: Internal Medicine

## 2017-07-17 DIAGNOSIS — Z1211 Encounter for screening for malignant neoplasm of colon: Secondary | ICD-10-CM

## 2017-07-19 ENCOUNTER — Encounter: Payer: Self-pay | Admitting: Internal Medicine

## 2017-07-21 LAB — MICROALBUMIN / CREATININE URINE RATIO
Creatinine, Urine: 87.3 mg/dL
MICROALB/CREAT RATIO: 57.8 mg/g{creat} — AB (ref 0.0–30.0)
MICROALBUM., U, RANDOM: 50.5 ug/mL

## 2017-07-25 ENCOUNTER — Telehealth: Payer: Self-pay

## 2017-07-25 DIAGNOSIS — I350 Nonrheumatic aortic (valve) stenosis: Secondary | ICD-10-CM | POA: Diagnosis not present

## 2017-07-25 DIAGNOSIS — I6523 Occlusion and stenosis of bilateral carotid arteries: Secondary | ICD-10-CM | POA: Diagnosis not present

## 2017-07-25 NOTE — Telephone Encounter (Signed)
Patient called about having gout flare up in middle toe ( next to big toe)- called and stated she took colchicine- 4 DOSES YESTERDAY - and has diarrhea bad last night. Scared to take another dose because she does not want diarrhea. Told she can be seen for OV early Friday afternoon with Adriana or go be checked out at [facility]. - Awaiting call back if questions.

## 2017-07-26 ENCOUNTER — Ambulatory Visit
Admission: EM | Admit: 2017-07-26 | Discharge: 2017-07-26 | Disposition: A | Discharge status: Home / Self Care | Payer: Medicare HMO | Attending: Family Medicine | Admitting: Family Medicine

## 2017-07-26 DIAGNOSIS — M109 Gout, unspecified: Secondary | ICD-10-CM

## 2017-07-26 DIAGNOSIS — M10071 Idiopathic gout, right ankle and foot: Secondary | ICD-10-CM | POA: Diagnosis not present

## 2017-07-26 DIAGNOSIS — I10 Essential (primary) hypertension: Secondary | ICD-10-CM | POA: Diagnosis not present

## 2017-07-26 DIAGNOSIS — M79675 Pain in left toe(s): Secondary | ICD-10-CM | POA: Diagnosis not present

## 2017-07-26 MED ORDER — NAPROXEN 500 MG PO TABS: 500.0000 mg | ORAL_TABLET | Freq: Two times a day (BID) | ORAL | 0 refills | Status: AC | PRN

## 2017-07-26 MED ORDER — OXYCODONE-ACETAMINOPHEN 5-325 MG PO TABS
1.0000 | ORAL_TABLET | Freq: Three times a day (TID) | ORAL | 0 refills | Status: DC | PRN
Start: 2017-07-26 — End: 2017-11-01

## 2017-07-26 NOTE — Discharge Instructions (Signed)
Take medication as prescribed. Rest. Drink plenty of fluids. Elevate.  ° °Follow up with your primary care physician this week as needed. Return to Urgent care for new or worsening concerns.  ° °

## 2017-07-26 NOTE — ED Provider Notes (Signed)
[facility] ____________________________________________  Time seen: Approximately 5:37 PM  I have reviewed the triage vital signs and the nursing notes.   HISTORY  Chief Complaint Foot Pain   HPI Samantha Lang is a 76 y.o. female  presenting for evaluation of pain to right foot second toe. Patient reports pain is in present for the last 2 days and states this is consistent with previous gout flareups. Patient states that she normally has pain to her right great toe for her gout, but reports that this is seen sensation. Describes pain as a aching pain and is sensitive to light touch. Denies any fall, trauma, break in skin or insect bites. Denies paresthesias, decreased range of motion or pain radiation. Patient reports that she did take home colchicine, but states that she took 4 pills within several hours and she began to have diarrhea from it. Patient reports the diarrhea has since resolved and states now with normal bowels. Denies any abnormal colored stools or blood in stool. Patient states that she took extra colchicine as she is trying to make the pain go away faster. Has not tried any other medications at home or over-the-counter medications. Patient reports otherwise feels well.  Denies chest pain, shortness of breath, abdominal pain, or rash. Denies recent sickness. Denies recent antibiotic use. Denies renal insufficiency. Denies cardiac surgeries.   Glean Hess, MD: PCP   Past Medical History:  Diagnosis Date  . Allergy   . Anxiety   . Depression    mild  . Diabetes mellitus age 84  . GERD (gastroesophageal reflux disease)   . Hemorrhoids   . Hyperlipidemia   . Hypertension 2003  . Incontinence    Female stress  . Postmenopausal atrophic vaginitis     Patient Active Problem List   Diagnosis Date Noted  . Trochanteric bursitis of left hip 12/07/2016  . 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 Chemotherapy management,  encounter for 09/05/2016  . CINV (chemotherapy-induced nausea and vomiting) 09/05/2016  . Endometrial cancer ([place]) 08/03/2016  . Edema leg 04/13/2016  . 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 Shoulder pain, left 02/12/2016  . Acid reflux 10/02/2015  . 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 Hyperlipidemia associated with type 2 diabetes mellitus ([place]) 04/08/2015  . Aortic heart valve narrowing 03/26/2015  . Carotid artery narrowing 03/26/2015  . 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 Polyarthralgia 09/27/2011  . Anxiety   . Environmental and seasonal allergies   . Postmenopausal atrophic vaginitis   . Depression   . 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 Essential hypertension     Past Surgical History:  Procedure Laterality Date  . BREAST BIOPSY Right 03/21/08   right, benign   . EYE SURGERY  Oct 2009   for ptosis,  Dr. Rosaria Ferries, eyelid lift  . TUBAL LIGATION       No current facility-administered medications for this encounter.   Current Outpatient Prescriptions:  .  amLODipine (NORVASC) 5 MG tablet, TAKE 1 TABLET(5 MG) BY MOUTH EVERY DAY, Disp: , Rfl:  .  aspirin 81 MG tablet, Take 81 mg by mouth daily., Disp: , Rfl:  .  atorvastatin (LIPITOR) 10 MG tablet, Take 1 tablet by mouth daily., Disp: , Rfl: 5 .  carvedilol (COREG) 3.125 MG tablet, TAKE 1 TABLET(3.125 MG) BY MOUTH TWICE DAILY WITH MEALS, Disp: , Rfl:  .  diazepam (VALIUM) 5  MG tablet, Take 1 tablet (5 mg total) by mouth at bedtime as needed., Disp: 30 tablet, Rfl: 2 .  JANUVIA 100 MG tablet, TAKE 1 TABLET BY MOUTH EVERY DAY, Disp: 30 tablet, Rfl: 12 .  naproxen (NAPROSYN) 500 MG tablet, Take 1 tablet (500 mg total) by mouth 2 (two) times daily as needed for moderate pain., Disp: 14 tablet, Rfl: 0 .  oxyCODONE-acetaminophen (ROXICET) 5-325 MG tablet, Take 1 tablet by mouth every 8 (eight) hours as needed  for moderate pain or severe pain (Do not drive or operate heavy machinery while taking as can cause drowsiness.)., Disp: 8 tablet, Rfl: 0 .  pantoprazole (PROTONIX) 40 MG tablet, TAKE 1 TABLET(40 MG) BY MOUTH DAILY, Disp: 30 tablet, Rfl: 5 .  telmisartan-hydrochlorothiazide (MICARDIS HCT) 80-25 MG tablet, Take 1 tablet by mouth daily., Disp: , Rfl: 3 .  venlafaxine XR (EFFEXOR-XR) 150 MG 24 hr capsule, TAKE 1 CAPSULE BY MOUTH DAILY, Disp: 30 capsule, Rfl: 12  Allergies Amlodipine; Cephalexin; Clarithromycin; and Lipitor [atorvastatin calcium]  Family History  Problem Relation Age of Onset  . Breast cancer Paternal Aunt   . Breast cancer Paternal Grandmother   . Cancer Father 59       lung  . Stroke Mother   . Hypertension Mother   . Ovarian cancer Neg Hx   . Colon cancer Neg Hx   . Diabetes Neg Hx     Social History Social History  Substance Use Topics  . Smoking status: Former Smoker    Quit date: 09/25/1988  . Smokeless tobacco: Never Used  . Alcohol use 0.0 oz/week     Comment: daily- beer,wine,liquor    Review of Systems Constitutional: No fever/chills. Cardiovascular: Denies chest pain. Respiratory: Denies shortness of breath. Gastrointestinal: No abdominal pain.  No nausea, no vomiting. As above.  Genitourinary: Negative for dysuria. Musculoskeletal: Negative for back pain. Skin: Negative for rash.   ____________________________________________   PHYSICAL EXAM:  VITAL SIGNS: ED Triage Vitals  Enc Vitals Group     BP 07/26/17 1459 (!) 154/46     Pulse Rate 07/26/17 1459 67     Resp 07/26/17 1459 16     Temp 07/26/17 1459 98.3 F (36.8 C)     Temp Source 07/26/17 1459 Oral     SpO2 07/26/17 1459 99 %     Weight 07/26/17 1455 172 lb (78 kg)     Height 07/26/17 1455 5\' 2"  (1.575 m)     Head Circumference --      Peak Flow --      Pain Score 07/26/17 1455 6     Pain Loc --      Pain Edu? --      Excl. in [place]? --     Constitutional: Alert and oriented.  Well appearing and in no acute distress. Cardiovascular: Normal rate, regular rhythm. Grossly normal heart sounds.  Good peripheral circulation. Respiratory: Normal respiratory effort without tachypnea nor retractions. Breath sounds are clear and equal bilaterally. No wheezes, rales, rhonchi. Musculoskeletal: Steady gait. Right foot second toe mild erythema and edema, moderate tenderness to palpation at MTP joint, mild tenderness to palpation at DIP joint, normal distal sensation, skin intact, no surrounding erythema, right foot otherwise nontender. Bilateral distal pedal pulses equal and easily palpated. No calf tenderness bilaterally. Right lower extremity otherwise nontender.  Neurologic:  Normal speech and language. Speech is normal. No gait instability.  Skin:  Skin is warm, dry. Psychiatric: Mood and affect are normal. Speech and behavior  are normal. Patient exhibits appropriate insight and judgment   ___________________________________________   LABS (all labs ordered are listed, but only abnormal results are displayed)  Labs Reviewed - No data to display  PROCEDURES Procedures     INITIAL IMPRESSION / [place] / ED COURSE  Pertinent labs & imaging results that were available during my care of the patient were reviewed by me and considered in my medical decision making (see chart for details).  Well-appearing patient. No acute distress. Suspect gout flare to right foot second toe. Declines trauma, declines x-ray. Will defer colchicine due to above history. Will treat patient with oral naproxen twice a day and when necessary Percocet as needed for breakthrough pain. Patient presents taking this medication in past and tolerated well. Encourage elevation, fluids and follow-up as needed.Discussed indication, risks and benefits of medications with patient. Postoperative shoe given for support.   [place] controlled substance database reviewed, and most recent controlled  medications documented 03/18/17 #10 norco.   Discussed follow up with Primary care physician this week. Discussed follow up and return parameters including no resolution or any worsening concerns. Patient verbalized understanding and agreed to plan.   ____________________________________________   FINAL CLINICAL IMPRESSION(S) / ED DIAGNOSES  Final diagnoses:  Acute gout of right foot, unspecified cause     Discharge Medication List as of 07/26/2017  3:46 PM    START taking these medications   Details  naproxen (NAPROSYN) 500 MG tablet Take 1 tablet (500 mg total) by mouth 2 (two) times daily as needed for moderate pain., Starting Wed 07/26/2017, Until Wed 08/02/2017, Normal    oxyCODONE-acetaminophen (ROXICET) 5-325 MG tablet Take 1 tablet by mouth every 8 (eight) hours as needed for moderate pain or severe pain (Do not drive or operate heavy machinery while taking as can cause drowsiness.)., Starting Wed 07/26/2017, Print        Note: This dictation was prepared with Dragon dictation along with smaller phrase technology. Any transcriptional errors that result from this process are unintentional.         Marylene Land, NP 07/26/17 1745

## 2017-07-26 NOTE — ED Triage Notes (Signed)
Right foot pain and redness. "I have the gout."  Pt has increased her colchicine to the point of diarrhea so she backed off.

## 2017-07-31 ENCOUNTER — Telehealth: Payer: Self-pay

## 2017-07-31 ENCOUNTER — Other Ambulatory Visit: Payer: Self-pay

## 2017-07-31 DIAGNOSIS — Z1211 Encounter for screening for malignant neoplasm of colon: Secondary | ICD-10-CM

## 2017-07-31 NOTE — Telephone Encounter (Signed)
Gastroenterology Pre-Procedure Review  Request Date:  Requesting Physician: Dr.   PATIENT REVIEW QUESTIONS: The patient responded to the following health history questions as indicated:    1. Are you having any GI issues? no 2. Do you have a personal history of Polyps? no 3. Do you have a family history of Colon Cancer or Polyps? no 4. Diabetes Mellitus? yes (Type 2) 5. Joint replacements in the past 12 months?no 6. Major health problems in the past 3 months?no 7. Any artificial heart valves, MVP, or defibrillator?no    MEDICATIONS & ALLERGIES:    Patient reports the following regarding taking any anticoagulation/antiplatelet therapy:   Plavix, Coumadin, Eliquis, Xarelto, Lovenox, Pradaxa, Brilinta, or Effient? no Aspirin? yes (ASA 81mg )  Patient confirms/reports the following medications:  Current Outpatient Prescriptions  Medication Sig Dispense Refill  . amLODipine (NORVASC) 5 MG tablet TAKE 1 TABLET(5 MG) BY MOUTH EVERY DAY    . aspirin 81 MG tablet Take 81 mg by mouth daily.    Marland Kitchen atorvastatin (LIPITOR) 10 MG tablet Take 1 tablet by mouth daily.  5  . carvedilol (COREG) 3.125 MG tablet TAKE 1 TABLET(3.125 MG) BY MOUTH TWICE DAILY WITH MEALS    . diazepam (VALIUM) 5 MG tablet Take 1 tablet (5 mg total) by mouth at bedtime as needed. 30 tablet 2  . JANUVIA 100 MG tablet TAKE 1 TABLET BY MOUTH EVERY DAY 30 tablet 12  . naproxen (NAPROSYN) 500 MG tablet Take 1 tablet (500 mg total) by mouth 2 (two) times daily as needed for moderate pain. 14 tablet 0  . oxyCODONE-acetaminophen (ROXICET) 5-325 MG tablet Take 1 tablet by mouth every 8 (eight) hours as needed for moderate pain or severe pain (Do not drive or operate heavy machinery while taking as can cause drowsiness.). 8 tablet 0  . pantoprazole (PROTONIX) 40 MG tablet TAKE 1 TABLET(40 MG) BY MOUTH DAILY 30 tablet 5  . telmisartan-hydrochlorothiazide (MICARDIS HCT) 80-25 MG tablet Take 1 tablet by mouth daily.  3  . venlafaxine XR  (EFFEXOR-XR) 150 MG 24 hr capsule TAKE 1 CAPSULE BY MOUTH DAILY 30 capsule 12   No current facility-administered medications for this visit.     Patient confirms/reports the following allergies:  Allergies  Allergen Reactions  . Amlodipine Swelling    Higher doses caused swelling   . Cephalexin   . Clarithromycin   . Lipitor [Atorvastatin Calcium]     Legs ache  With higher dose    No orders of the defined types were placed in this encounter.   AUTHORIZATION INFORMATION Primary Insurance: 1D#: Group #:  Secondary Insurance: 1D#: Group #:  SCHEDULE INFORMATION: Date: 08/18/17 Time: Location: [place]

## 2017-08-14 ENCOUNTER — Encounter: Payer: Self-pay | Admitting: *Deleted

## 2017-08-15 ENCOUNTER — Other Ambulatory Visit: Payer: Self-pay | Admitting: Internal Medicine

## 2017-08-15 ENCOUNTER — Encounter: Payer: Self-pay | Admitting: Internal Medicine

## 2017-08-15 DIAGNOSIS — Z1231 Encounter for screening mammogram for malignant neoplasm of breast: Secondary | ICD-10-CM

## 2017-08-15 DIAGNOSIS — F419 Anxiety disorder, unspecified: Secondary | ICD-10-CM

## 2017-08-15 MED ORDER — DIAZEPAM 5 MG PO TABS: 5.0000 mg | ORAL_TABLET | Freq: Every evening | ORAL | 2 refills | Status: DC | PRN

## 2017-08-15 NOTE — Telephone Encounter (Signed)
Pt MyChart msg. Please advise.

## 2017-08-16 DIAGNOSIS — C541 Malignant neoplasm of endometrium: Secondary | ICD-10-CM | POA: Diagnosis not present

## 2017-08-16 DIAGNOSIS — C549 Malignant neoplasm of corpus uteri, unspecified: Secondary | ICD-10-CM | POA: Diagnosis not present

## 2017-08-23 DIAGNOSIS — I350 Nonrheumatic aortic (valve) stenosis: Secondary | ICD-10-CM | POA: Diagnosis not present

## 2017-08-24 DIAGNOSIS — I340 Nonrheumatic mitral (valve) insufficiency: Secondary | ICD-10-CM | POA: Diagnosis not present

## 2017-08-24 DIAGNOSIS — I350 Nonrheumatic aortic (valve) stenosis: Secondary | ICD-10-CM | POA: Diagnosis not present

## 2017-08-24 DIAGNOSIS — I6523 Occlusion and stenosis of bilateral carotid arteries: Secondary | ICD-10-CM | POA: Diagnosis not present

## 2017-08-24 DIAGNOSIS — I10 Essential (primary) hypertension: Secondary | ICD-10-CM | POA: Diagnosis not present

## 2017-08-25 ENCOUNTER — Ambulatory Visit: Payer: Medicare HMO | Admitting: Internal Medicine

## 2017-08-31 ENCOUNTER — Encounter: Payer: Self-pay | Admitting: *Deleted

## 2017-09-01 NOTE — Anesthesia Preprocedure Evaluation (Addendum)
Anesthesia Evaluation  Patient identified by MRN, date of birth, ID band Patient awake    Reviewed: Allergy & Precautions, H&P , NPO status , Patient's Chart, lab work & pertinent test results  Airway Mallampati: II  TM Distance: >3 FB     Dental   Pulmonary former smoker,    Pulmonary exam normal        Cardiovascular hypertension, negative cardio ROS Normal cardiovascular exam+ Valvular Problems/Murmurs AS   Recent cardiac echo showed normal LV function and Aortic valve sclerosis without stenosis.   Neuro/Psych    GI/Hepatic Neg liver ROS, Medicated,  Endo/Other  diabetes, Well Controlled, Type 2  Renal/GU      Musculoskeletal   Abdominal   Peds  Hematology negative hematology ROS (+)   Anesthesia Other Findings   Reproductive/Obstetrics                           Anesthesia Physical Anesthesia Plan  ASA: II  Anesthesia Plan: General   Post-op Pain Management:    Induction:   PONV Risk Score and Plan:   Airway Management Planned:   Additional Equipment:   Intra-op Plan:   Post-operative Plan:   Informed Consent: I have reviewed the patients History and Physical, chart, labs and discussed the procedure including the risks, benefits and alternatives for the proposed anesthesia with the patient or authorized representative who has indicated his/her understanding and acceptance.     Plan Discussed with:   Anesthesia Plan Comments:         Anesthesia Quick Evaluation

## 2017-09-11 ENCOUNTER — Ambulatory Visit: Payer: Medicare HMO

## 2017-09-18 ENCOUNTER — Encounter: Payer: Self-pay | Admitting: *Deleted

## 2017-09-21 NOTE — Discharge Instructions (Signed)
General Anesthesia, Adult, Care After °These instructions provide you with information about caring for yourself after your procedure. Your health care provider may also give you more specific instructions. Your treatment has been planned according to current medical practices, but problems sometimes occur. Call your health care provider if you have any problems or questions after your procedure. °What can I expect after the procedure? °After the procedure, it is common to have: °· Vomiting. °· A sore throat. °· Mental slowness. ° °It is common to feel: °· Nauseous. °· Cold or shivery. °· Sleepy. °· Tired. °· Sore or achy, even in parts of your body where you did not have surgery. ° °Follow these instructions at home: °For at least 24 hours after the procedure: °· Do not: °? Participate in activities where you could fall or become injured. °? Drive. °? Use heavy machinery. °? Drink alcohol. °? Take sleeping pills or medicines that cause drowsiness. °? Make important decisions or sign legal documents. °? Take care of children on your own. °· Rest. °Eating and drinking °· If you vomit, drink water, juice, or soup when you can drink without vomiting. °· Drink enough fluid to keep your urine clear or pale yellow. °· Make sure you have little or no nausea before eating solid foods. °· Follow the diet recommended by your health care provider. °General instructions °· Have a responsible adult stay with you until you are awake and alert. °· Return to your normal activities as told by your health care provider. Ask your health care provider what activities are safe for you. °· Take over-the-counter and prescription medicines only as told by your health care provider. °· If you smoke, do not smoke without supervision. °· Keep all follow-up visits as told by your health care provider. This is important. °Contact a health care provider if: °· You continue to have nausea or vomiting at home, and medicines are not helpful. °· You  cannot drink fluids or start eating again. °· You cannot urinate after 8-12 hours. °· You develop a skin rash. °· You have fever. °· You have increasing redness at the site of your procedure. °Get help right away if: °· You have difficulty breathing. °· You have chest pain. °· You have unexpected bleeding. °· You feel that you are having a life-threatening or urgent problem. °This information is not intended to replace advice given to you by your health care provider. Make sure you discuss any questions you have with your health care provider. °Document Released: 03/20/2001 Document Revised: 05/16/2016 Document Reviewed: 11/26/2015 °Elsevier Interactive Patient Education © 2018 [employer] ° °

## 2017-09-22 ENCOUNTER — Ambulatory Visit
Admission: RE | Disposition: A | Discharge status: Home / Self Care | Payer: Self-pay | Source: Ambulatory Visit | Attending: Gastroenterology

## 2017-09-22 ENCOUNTER — Ambulatory Visit
Admission: RE | Admit: 2017-09-22 | Discharge: 2017-09-22 | Disposition: A | Discharge status: Home / Self Care | Payer: Medicare HMO | Source: Ambulatory Visit | Attending: Gastroenterology | Admitting: Gastroenterology

## 2017-09-22 ENCOUNTER — Ambulatory Visit: Payer: Medicare HMO | Admitting: Anesthesiology

## 2017-09-22 DIAGNOSIS — N952 Postmenopausal atrophic vaginitis: Secondary | ICD-10-CM | POA: Diagnosis not present

## 2017-09-22 DIAGNOSIS — E785 Hyperlipidemia, unspecified: Secondary | ICD-10-CM | POA: Diagnosis not present

## 2017-09-22 DIAGNOSIS — F419 Anxiety disorder, unspecified: Secondary | ICD-10-CM | POA: Insufficient documentation

## 2017-09-22 DIAGNOSIS — Z7984 Long term (current) use of oral hypoglycemic drugs: Secondary | ICD-10-CM | POA: Diagnosis not present

## 2017-09-22 DIAGNOSIS — Z801 Family history of malignant neoplasm of trachea, bronchus and lung: Secondary | ICD-10-CM | POA: Insufficient documentation

## 2017-09-22 DIAGNOSIS — Z803 Family history of malignant neoplasm of breast: Secondary | ICD-10-CM | POA: Diagnosis not present

## 2017-09-22 DIAGNOSIS — E119 Type 2 diabetes mellitus without complications: Secondary | ICD-10-CM | POA: Insufficient documentation

## 2017-09-22 DIAGNOSIS — F329 Major depressive disorder, single episode, unspecified: Secondary | ICD-10-CM | POA: Diagnosis not present

## 2017-09-22 DIAGNOSIS — Z79899 Other long term (current) drug therapy: Secondary | ICD-10-CM | POA: Insufficient documentation

## 2017-09-22 DIAGNOSIS — I10 Essential (primary) hypertension: Secondary | ICD-10-CM | POA: Insufficient documentation

## 2017-09-22 DIAGNOSIS — Z7982 Long term (current) use of aspirin: Secondary | ICD-10-CM | POA: Diagnosis not present

## 2017-09-22 DIAGNOSIS — Z9889 Other specified postprocedural states: Secondary | ICD-10-CM | POA: Diagnosis not present

## 2017-09-22 DIAGNOSIS — K5730 Diverticulosis of large intestine without perforation or abscess without bleeding: Secondary | ICD-10-CM | POA: Diagnosis not present

## 2017-09-22 DIAGNOSIS — Z8542 Personal history of malignant neoplasm of other parts of uterus: Secondary | ICD-10-CM | POA: Insufficient documentation

## 2017-09-22 DIAGNOSIS — K640 First degree hemorrhoids: Secondary | ICD-10-CM | POA: Insufficient documentation

## 2017-09-22 DIAGNOSIS — Z888 Allergy status to other drugs, medicaments and biological substances status: Secondary | ICD-10-CM | POA: Diagnosis not present

## 2017-09-22 DIAGNOSIS — I350 Nonrheumatic aortic (valve) stenosis: Secondary | ICD-10-CM | POA: Diagnosis not present

## 2017-09-22 DIAGNOSIS — Z1211 Encounter for screening for malignant neoplasm of colon: Secondary | ICD-10-CM

## 2017-09-22 DIAGNOSIS — Z8249 Family history of ischemic heart disease and other diseases of the circulatory system: Secondary | ICD-10-CM | POA: Diagnosis not present

## 2017-09-22 DIAGNOSIS — Z87891 Personal history of nicotine dependence: Secondary | ICD-10-CM | POA: Diagnosis not present

## 2017-09-22 DIAGNOSIS — Z881 Allergy status to other antibiotic agents status: Secondary | ICD-10-CM | POA: Insufficient documentation

## 2017-09-22 DIAGNOSIS — N393 Stress incontinence (female) (male): Secondary | ICD-10-CM | POA: Insufficient documentation

## 2017-09-22 DIAGNOSIS — K219 Gastro-esophageal reflux disease without esophagitis: Secondary | ICD-10-CM | POA: Insufficient documentation

## 2017-09-22 DIAGNOSIS — R69 Illness, unspecified: Secondary | ICD-10-CM | POA: Diagnosis not present

## 2017-09-22 DIAGNOSIS — Z823 Family history of stroke: Secondary | ICD-10-CM | POA: Insufficient documentation

## 2017-09-22 HISTORY — PX: POLYPECTOMY: SHX5525

## 2017-09-22 HISTORY — DX: Malignant neoplasm of uterus, part unspecified: C55

## 2017-09-22 HISTORY — DX: Cardiac murmur, unspecified: R01.1

## 2017-09-22 HISTORY — PX: COLONOSCOPY WITH PROPOFOL: SHX5780

## 2017-09-22 HISTORY — DX: Nonrheumatic aortic (valve) stenosis: I35.0

## 2017-09-22 HISTORY — DX: Nonrheumatic mitral (valve) insufficiency: I34.0

## 2017-09-22 LAB — GLUCOSE, CAPILLARY
Glucose-Capillary: 188 mg/dL — ABNORMAL HIGH (ref 65–99)
Glucose-Capillary: 183 mg/dL — ABNORMAL HIGH (ref 65–99)

## 2017-09-22 SURGERY — COLONOSCOPY WITH PROPOFOL
Anesthesia: General | Wound class: Dirty or Infected

## 2017-09-22 MED ORDER — PROPOFOL 10 MG/ML IV BOLUS
INTRAVENOUS | Status: DC | PRN
  Administered 2017-09-22: 50 mg via INTRAVENOUS
  Administered 2017-09-22: 30 mg via INTRAVENOUS
  Administered 2017-09-22: 80 mg via INTRAVENOUS
  Administered 2017-09-22: 40 mg via INTRAVENOUS
  Administered 2017-09-22: 50 mg via INTRAVENOUS

## 2017-09-22 MED ORDER — LACTATED RINGERS IV SOLN
INTRAVENOUS | Status: DC
  Administered 2017-09-22: 07:00:00 via INTRAVENOUS

## 2017-09-22 MED ORDER — LIDOCAINE HCL (CARDIAC) 20 MG/ML IV SOLN
INTRAVENOUS | Status: DC | PRN
  Administered 2017-09-22: 20 mg via INTRAVENOUS

## 2017-09-22 MED ORDER — LACTATED RINGERS IV SOLN: INTRAVENOUS | Status: DC

## 2017-09-22 MED ORDER — STERILE WATER FOR IRRIGATION IR SOLN
Status: DC | PRN
  Administered 2017-09-22: 08:00:00

## 2017-09-22 MED ORDER — SODIUM CHLORIDE 0.9 % IV SOLN: INTRAVENOUS | Status: DC

## 2017-09-22 SURGICAL SUPPLY — 23 items
CANISTER SUCT 1200ML W/VALVE (MISCELLANEOUS) ×3 IMPLANT
CLIP HMST 235XBRD CATH ROT (MISCELLANEOUS) IMPLANT
CLIP RESOLUTION 360 11X235 (MISCELLANEOUS)
FCP ESCP3.2XJMB 240X2.8X (MISCELLANEOUS)
FORCEPS BIOP RAD 4 LRG CAP 4 (CUTTING FORCEPS) IMPLANT
FORCEPS BIOP RJ4 240 W/NDL (MISCELLANEOUS)
FORCEPS ESCP3.2XJMB 240X2.8X (MISCELLANEOUS) IMPLANT
GOWN CVR UNV OPN BCK APRN NK (MISCELLANEOUS) ×4 IMPLANT
GOWN ISOL THUMB LOOP REG UNIV (MISCELLANEOUS) ×2
INJECTOR VARIJECT VIN23 (MISCELLANEOUS) IMPLANT
KIT DEFENDO VALVE AND CONN (KITS) IMPLANT
KIT ENDO PROCEDURE OLY (KITS) ×3 IMPLANT
MARKER SPOT ENDO TATTOO 5ML (MISCELLANEOUS) IMPLANT
PAD GROUND ADULT SPLIT (MISCELLANEOUS) IMPLANT
PROBE APC STR FIRE (PROBE) IMPLANT
RETRIEVER NET ROTH 2.5X230 LF (MISCELLANEOUS) IMPLANT
SNARE SHORT THROW 13M SML OVAL (MISCELLANEOUS) IMPLANT
SNARE SHORT THROW 30M LRG OVAL (MISCELLANEOUS) IMPLANT
SNARE SNG USE RND 15MM (INSTRUMENTS) IMPLANT
SPOT EX ENDOSCOPIC TATTOO (MISCELLANEOUS)
TRAP ETRAP POLY (MISCELLANEOUS) IMPLANT
VARIJECT INJECTOR VIN23 (MISCELLANEOUS)
WATER STERILE IRR 250ML POUR (IV SOLUTION) ×3 IMPLANT

## 2017-09-22 NOTE — Anesthesia Procedure Notes (Signed)
Procedure Name: MAC Date/Time: 09/22/2017 7:37 AM Performed by: Janna Arch Pre-anesthesia Checklist: Patient identified, Emergency Drugs available, Suction available and Patient being monitored Patient Re-evaluated:Patient Re-evaluated prior to induction Oxygen Delivery Method: Nasal cannula

## 2017-09-22 NOTE — H&P (Signed)
Lucilla Lame, MD [facility] [address], [place], [place] [address] Phone: [phone] Fax : [phone]  Primary Care Physician:  Glean Hess, MD Primary Gastroenterologist:  Dr. Allen Norris  Pre-Procedure History & Physical: HPI:  Samantha Lang is a 76 y.o. female is here for a screening colonoscopy.   Past Medical History:  Diagnosis Date  . Allergy   . Anxiety   . Aortic valve stenosis, mild   . Depression    mild  . Diabetes mellitus age 18  . GERD (gastroesophageal reflux disease)   . Heart murmur   . Hemorrhoids   . Hyperlipidemia   . Hypertension 2003  . Incontinence    Female stress  . Mitral incompetence   . Postmenopausal atrophic vaginitis   . Uterine cancer [facility])    treated    Past Surgical History:  Procedure Laterality Date  . BREAST BIOPSY Right 03/21/08   right, benign   . EYE SURGERY  Oct 2009   for ptosis,  Dr. Rosaria Ferries, eyelid lift  . TUBAL LIGATION      Prior to Admission medications   Medication Sig Start Date End Date Taking? Authorizing Provider  amLODipine (NORVASC) 5 MG tablet TAKE 1 TABLET(5 MG) BY MOUTH EVERY DAY 08/23/16  Yes [provider]  aspirin 81 MG tablet Take 81 mg by mouth daily.   Yes [provider]  atorvastatin (LIPITOR) 10 MG tablet Take 1 tablet by mouth daily. 07/12/15  Yes [provider]  carvedilol (COREG) 3.125 MG tablet TAKE 1 TABLET(3.125 MG) BY MOUTH TWICE DAILY WITH MEALS 10/20/16  Yes [provider]  diazepam (VALIUM) 5 MG tablet Take 1 tablet (5 mg total) by mouth at bedtime as needed. 08/15/17  Yes Glean Hess, MD  JANUVIA 100 MG tablet TAKE 1 TABLET BY MOUTH EVERY DAY 10/20/16  Yes Glean Hess, MD  pantoprazole (PROTONIX) 40 MG tablet TAKE 1 TABLET(40 MG) BY MOUTH DAILY 10/05/16  Yes Glean Hess, MD  telmisartan-hydrochlorothiazide (MICARDIS HCT) 80-25 MG tablet Take 1 tablet by mouth daily. 06/08/17  Yes [provider]  venlafaxine XR  (EFFEXOR-XR) 150 MG 24 hr capsule TAKE 1 CAPSULE BY MOUTH DAILY 04/08/17  Yes Glean Hess, MD  oxyCODONE-acetaminophen (ROXICET) 5-325 MG tablet Take 1 tablet by mouth every 8 (eight) hours as needed for moderate pain or severe pain (Do not drive or operate heavy machinery while taking as can cause drowsiness.). Patient not taking: Reported on 08/14/2017 07/26/17   Marylene Land, NP    Allergies as of 07/31/2017 - Review Complete 07/26/2017  Allergen Reaction Noted  . Amlodipine Swelling 10/02/2015  . Cephalexin  09/26/2011  . Clarithromycin  09/26/2011  . Lipitor [atorvastatin calcium]  09/26/2011    Family History  Problem Relation Age of Onset  . Breast cancer Paternal Aunt   . Breast cancer Paternal Grandmother   . Cancer Father 79       lung  . Stroke Mother   . Hypertension Mother   . Ovarian cancer Neg Hx   . Colon cancer Neg Hx   . Diabetes Neg Hx     Social History   Social History  . Marital status: Married    Spouse name: N/A  . Number of children: N/A  . Years of education: N/A   Occupational History  . Not on file.   Social History Main Topics  . Smoking status: Former Smoker    Quit date: 09/25/1988  . Smokeless tobacco:  Never Used  . Alcohol use 8.4 oz/week    14 Shots of liquor per week     Comment: daily- beer,wine,liquor  . Drug use: No  . Sexual activity: Yes    Birth control/ protection: Post-menopausal   Other Topics Concern  . Not on file   Social History Narrative  . No narrative on file    Review of Systems: See HPI, otherwise negative ROS  Physical Exam: BP (!) 169/58   Pulse 67   Temp (!) 97.2 F (36.2 C) (Temporal)   Resp 16   Ht 5\' 2"  (1.575 m)   Wt 167 lb (75.8 kg)   SpO2 99%   BMI 30.54 kg/m  General:   Alert,  pleasant and cooperative in NAD Head:  Normocephalic and atraumatic. Neck:  Supple; no masses or thyromegaly. Lungs:  Clear throughout to auscultation.    Heart:  Regular rate and rhythm. Abdomen:  Soft,  nontender and nondistended. Normal bowel sounds, without guarding, and without rebound.   Neurologic:  Alert and  oriented x4;  grossly normal neurologically.  Impression/Plan: Samantha Lang is now here to undergo a screening colonoscopy.  Risks, benefits, and alternatives regarding colonoscopy have been reviewed with the patient.  Questions have been answered.  All parties agreeable.

## 2017-09-22 NOTE — Anesthesia Postprocedure Evaluation (Signed)
Anesthesia Post Note  Patient: Samantha Lang  Procedure(s) Performed: Procedure(s) (LRB): COLONOSCOPY WITH PROPOFOL (N/A)  Patient location during evaluation: PACU Anesthesia Type: General Level of consciousness: awake and alert Pain management: pain level controlled Vital Signs Assessment: post-procedure vital signs reviewed and stable Respiratory status: spontaneous breathing Cardiovascular status: blood pressure returned to baseline Postop Assessment: no headache Anesthetic complications: no    Jaci Standard, III,  Nawaal Alling D

## 2017-09-22 NOTE — Op Note (Signed)
[facility] Gastroenterology Patient Name: Samantha Lang Procedure Date: 09/22/2017 7:30 AM MRN: [id number] Account #: [id number] Date of Birth: 09/18/1941 Admit Type: Outpatient Age: 76 Room: [facility] OR ROOM 01 Gender: Female Note Status: Finalized Procedure:            Colonoscopy Indications:          Screening for colorectal malignant neoplasm Providers:            Lucilla Lame MD, MD Referring MD:         Halina Maidens, MD (Referring MD) Medicines:            Propofol per Anesthesia Complications:        No immediate complications. Procedure:            Pre-Anesthesia Assessment:                       - Prior to the procedure, a History and Physical was                        performed, and patient medications and allergies were                        reviewed. The patient's tolerance of previous                        anesthesia was also reviewed. The risks and benefits of                        the procedure and the sedation options and risks were                        discussed with the patient. All questions were                        answered, and informed consent was obtained. Prior                        Anticoagulants: The patient has taken no previous                        anticoagulant or antiplatelet agents. ASA Grade                        Assessment: II - A patient with mild systemic disease.                        After reviewing the risks and benefits, the patient was                        deemed in satisfactory condition to undergo the                        procedure.                       After obtaining informed consent, the colonoscope was                        passed under direct vision. Throughout the procedure,  the patient's blood pressure, pulse, and oxygen                        saturations were monitored continuously. The Olympus                        Colonoscope 190 [id number]) was introduced through the                         anus and advanced to the the cecum, identified by                        appendiceal orifice and ileocecal valve. The                        colonoscopy was performed without difficulty. The                        patient tolerated the procedure well. The quality of                        the bowel preparation was excellent. Findings:      The perianal and digital rectal examinations were normal.      Multiple small-mouthed diverticula were found in the sigmoid colon.      Non-bleeding internal hemorrhoids were found during retroflexion. The       hemorrhoids were Grade I (internal hemorrhoids that do not prolapse). Impression:           - Diverticulosis in the sigmoid colon.                       - Non-bleeding internal hemorrhoids.                       - No specimens collected. Recommendation:       - Discharge patient to home.                       - Resume previous diet.                       - Continue present medications.                       - Await pathology results. Procedure Code(s):    --- Professional ---                       [id number], Colonoscopy, flexible; diagnostic, including                        collection of specimen(s) by brushing or washing, when                        performed (separate procedure) Diagnosis Code(s):    --- Professional ---                       Z12.11, Encounter for screening for malignant neoplasm                        of colon CPT copyright 2016 American Medical Association. All rights reserved. The codes documented in this  report are preliminary and upon coder review may  be revised to meet current compliance requirements. Lucilla Lame MD, MD 09/22/2017 7:55:20 AM This report has been signed electronically. Number of Addenda: 0 Note Initiated On: 09/22/2017 7:30 AM Scope Withdrawal Time: 0 hours 6 minutes 17 seconds  Total Procedure Duration: 0 hours 11 minutes 18 seconds       [facility]

## 2017-09-22 NOTE — Transfer of Care (Signed)
Immediate Anesthesia Transfer of Care Note  Patient: Samantha Lang  Procedure(s) Performed: Procedure(s) with comments: COLONOSCOPY WITH PROPOFOL (N/A) - diabetic  Patient Location: PACU  Anesthesia Type: General  Level of Consciousness: awake, alert  and patient cooperative  Airway and Oxygen Therapy: Patient Spontanous Breathing and Patient connected to supplemental oxygen  Post-op Assessment: Post-op Vital signs reviewed, Patient's Cardiovascular Status Stable, Respiratory Function Stable, Patent Airway and No signs of Nausea or vomiting  Post-op Vital Signs: Reviewed and stable  Complications: No apparent anesthesia complications

## 2017-10-26 ENCOUNTER — Other Ambulatory Visit: Payer: Self-pay | Admitting: Internal Medicine

## 2017-11-01 ENCOUNTER — Encounter: Payer: Self-pay | Admitting: Internal Medicine

## 2017-11-01 ENCOUNTER — Ambulatory Visit: Payer: Medicare HMO | Admitting: Internal Medicine

## 2017-11-01 VITALS — BP 140/72 | HR 76 | Ht 62.0 in | Wt 173.0 lb

## 2017-11-01 DIAGNOSIS — E1169 Type 2 diabetes mellitus with other specified complication: Secondary | ICD-10-CM | POA: Diagnosis not present

## 2017-11-01 DIAGNOSIS — I10 Essential (primary) hypertension: Secondary | ICD-10-CM | POA: Diagnosis not present

## 2017-11-01 DIAGNOSIS — E785 Hyperlipidemia, unspecified: Secondary | ICD-10-CM

## 2017-11-01 DIAGNOSIS — Z23 Encounter for immunization: Secondary | ICD-10-CM

## 2017-11-01 DIAGNOSIS — E2839 Other primary ovarian failure: Secondary | ICD-10-CM | POA: Diagnosis not present

## 2017-11-01 DIAGNOSIS — E119 Type 2 diabetes mellitus without complications: Secondary | ICD-10-CM

## 2017-11-01 NOTE — Progress Notes (Signed)
Date:  11/01/2017   Name:  Samantha Lang   DOB:  [birth year]   MRN:  [id number]   Chief Complaint: Diabetes (Flu Shot Regular Dose) Diabetes  She presents for her follow-up diabetic visit. She has type 2 diabetes mellitus. Her disease course has been stable. Pertinent negatives for hypoglycemia include no headaches or tremors. Pertinent negatives for diabetes include no chest pain, no fatigue, no foot paresthesias, no polydipsia and no polyuria. She is following a generally healthy diet. An ACE inhibitor/angiotensin II receptor blocker is being taken.  Hypertension  This is a chronic problem. The problem is controlled. Pertinent negatives include no chest pain, headaches, palpitations or shortness of breath. The current treatment provides significant improvement. There are no compliance problems.  There is no history of kidney disease or CAD/MI.  Hyperlipidemia  This is a chronic problem. Pertinent negatives include no chest pain or shortness of breath. Current antihyperlipidemic treatment includes statins. The current treatment provides significant improvement of lipids.   Lab Results  Component Value Date   HGBA1C 6.4 (H) 07/13/2017   Lab Results  Component Value Date   CHOL 177 10/02/2015   HDL 48 10/02/2015   LDLCALC 83 10/02/2015   LDLDIRECT 138.3 09/26/2011   TRIG 229 (H) 10/02/2015   CHOLHDL 3.7 10/02/2015   Lab Results  Component Value Date   CREATININE 0.77 07/13/2017   BUN 18 07/13/2017   NA 140 07/13/2017   K 3.5 07/13/2017   CL 98 07/13/2017   CO2 23 07/13/2017      Review of Systems  Constitutional: Negative for appetite change, fatigue, fever and unexpected weight change.  HENT: Negative for tinnitus and trouble swallowing.   Eyes: Negative for visual disturbance.  Respiratory: Negative for cough, chest tightness and shortness of breath.   Cardiovascular: Negative for chest pain, palpitations and leg swelling.  Gastrointestinal: Negative for abdominal  pain.  Endocrine: Negative for polydipsia and polyuria.  Genitourinary: Negative for dysuria and hematuria.  Musculoskeletal: Negative for arthralgias.  Neurological: Negative for tremors, numbness and headaches.  Psychiatric/Behavioral: Negative for dysphoric mood.    Patient Active Problem List   Diagnosis Date Noted  . Colon cancer screening   . Trochanteric bursitis of left hip 12/07/2016  . 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 Chemotherapy management, encounter for 09/05/2016  . CINV (chemotherapy-induced nausea and vomiting) 09/05/2016  . Endometrial cancer ([place]) 08/03/2016  . Edema leg 04/13/2016  . DM type 2 without retinopathy ([place]) 03/14/2016  . Shoulder pain, left 02/12/2016  . Acid reflux 10/02/2015  . 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 Hyperlipidemia associated with type 2 diabetes mellitus ([place]) 04/08/2015  . Aortic heart valve narrowing 03/26/2015  . Carotid artery narrowing 03/26/2015  . 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 Polyarthralgia 09/27/2011  . Anxiety   . Environmental and seasonal allergies   . Postmenopausal atrophic vaginitis   . Depression   . 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 Essential hypertension     Prior to Admission medications   Medication Sig Start Date End Date Taking? Authorizing Provider  amLODipine (NORVASC) 5 MG tablet TAKE 1 TABLET(5 MG) BY MOUTH EVERY DAY 08/23/16  Yes [provider]  aspirin 81 MG tablet Take 81 mg by mouth daily.   Yes [provider]  atorvastatin (LIPITOR) 10 MG tablet Take 1 tablet by mouth daily. 07/12/15  Yes [provider]  carvedilol (COREG) 3.125 MG tablet TAKE 1 TABLET(3.125 MG) BY MOUTH TWICE DAILY WITH MEALS 10/20/16  Yes [provider]  diazepam (VALIUM) 5 MG tablet Take 1 tablet (5 mg total) by mouth at bedtime as needed.  08/15/17  Yes Glean Hess, MD  JANUVIA 100 MG tablet TAKE 1 TABLET BY MOUTH EVERY DAY 10/20/16  Yes Glean Hess, MD  pantoprazole (PROTONIX) 40 MG tablet TAKE 1 TABLET BY MOUTH DAILY 10/26/17  Yes Glean Hess, MD  telmisartan-hydrochlorothiazide (MICARDIS HCT) 80-25 MG tablet Take 1 tablet by mouth daily. 06/08/17  Yes [provider]  venlafaxine XR (EFFEXOR-XR) 150 MG 24 hr capsule TAKE 1 CAPSULE BY MOUTH DAILY 04/08/17  Yes Glean Hess, MD    Allergies  Allergen Reactions  . Lipitor [Atorvastatin Calcium]     Legs ache  With higher dose  . Cephalexin Rash  . Clarithromycin Rash    Past Surgical History:  Procedure Laterality Date  . BREAST BIOPSY Right 03/21/08   right, benign   . EYE SURGERY  Oct 2009   for ptosis,  Dr. Rosaria Ferries, eyelid lift  . TUBAL LIGATION      Social History   Tobacco Use  . Smoking status: Former Smoker    Last attempt to quit: 09/25/1988    Years since quitting: 29.1  . Smokeless tobacco: Never Used  Substance Use Topics  . Alcohol use: Yes    Alcohol/week: 8.4 oz    Types: 14 Shots of liquor per week    Comment: daily- beer,wine,liquor  . Drug use: No     Medication list has been reviewed and updated.  PHQ 2/9 Scores 11/01/2017 07/13/2017 07/11/2016 10/02/2015  PHQ - 2 Score 0 0 0 0  PHQ- 9 Score 0 - - -    Physical Exam  Constitutional: She is oriented to person, place, and time. She appears well-developed. No distress.  HENT:  Head: Normocephalic and atraumatic.  Neck: Normal range of motion. Neck supple. Carotid bruit is not present.  Cardiovascular: Normal rate, regular rhythm and normal heart sounds.  Pulmonary/Chest: Effort normal and breath sounds normal. No respiratory distress. She has no wheezes.  Musculoskeletal: Normal range of motion.  Neurological: She is alert and oriented to person, place, and time. She has normal strength. No cranial nerve deficit or sensory deficit.  Skin: Skin is warm and dry.  No rash noted.  Psychiatric: She has a normal mood and affect. Her speech is normal and behavior is normal. Thought content normal.  Nursing note and vitals reviewed.   BP 140/72   Pulse 76   Ht 5\' 2"  (1.575 m)   Wt 173 lb (78.5 kg)   SpO2 97%   BMI 31.64 kg/m   Assessment and Plan: 1. DM type 2 without retinopathy ([place]) Continue current therapy Consider Keto diet again Bring in form for Januvia patient assistance - Basic metabolic panel - Hemoglobin A1c  2. Hyperlipidemia associated with type 2 diabetes mellitus ([place]) On statin  3. Essential hypertension controlled - TSH  4. Ovarian failure - DG Bone Density; Future  5. Need for influenza vaccination - Flu Vaccine QUAD 36+ mos IM   No orders of the defined types were placed in this encounter.   Partially dictated using Editor, commissioning. Any errors are unintentional.  Halina Maidens, MD [facility]  11/01/2017

## 2017-11-01 NOTE — Patient Instructions (Addendum)
I have asked our scheduler to schedule both your Mammogram and your Bone Density and call you with the appointments  Schedule annual diabetic eye exam!

## 2017-11-02 LAB — BASIC METABOLIC PANEL
BUN / CREAT RATIO: 26 (ref 12–28)
BUN: 24 mg/dL (ref 8–27)
CALCIUM: 9.7 mg/dL (ref 8.7–10.3)
CO2: 26 mmol/L (ref 20–29)
Chloride: 98 mmol/L (ref 96–106)
Creatinine, Ser: 0.93 mg/dL (ref 0.57–1.00)
GFR, EST AFRICAN AMERICAN: 70 mL/min/{1.73_m2} (ref >59)
GFR, EST NON AFRICAN AMERICAN: 61 mL/min/{1.73_m2} (ref >59)
Glucose: 116 mg/dL — ABNORMAL HIGH (ref 65–99)
POTASSIUM: 3.7 mmol/L (ref 3.5–5.2)
Sodium: 138 mmol/L (ref 134–144)

## 2017-11-02 LAB — HEMOGLOBIN A1C
Est. average glucose Bld gHb Est-mCnc: 146 mg/dL
Hgb A1c MFr Bld: 6.7 % — ABNORMAL HIGH (ref 4.8–5.6)

## 2017-11-02 LAB — TSH: TSH: 2.97 u[IU]/mL (ref 0.450–4.500)

## 2017-11-13 ENCOUNTER — Ambulatory Visit (INDEPENDENT_AMBULATORY_CARE_PROVIDER_SITE_OTHER): Payer: Medicare HMO

## 2017-11-13 VITALS — BP 138/60 | HR 72 | Temp 97.6°F | Resp 16 | Ht 62.0 in | Wt 174.0 lb

## 2017-11-13 DIAGNOSIS — Z0000 Encounter for general adult medical examination without abnormal findings: Secondary | ICD-10-CM | POA: Diagnosis not present

## 2017-11-13 NOTE — Patient Instructions (Addendum)
[name] , Thank you for taking time to come for your Medicare Wellness Visit. I appreciate your ongoing commitment to your health goals. Please review the following plan we discussed and let me know if I can assist you in the future.   Screening recommendations/referrals: Colonoscopy: Completed 09/22/17. Repeat colon cancer screening no longer required Mammogram: You are scheduled to have your mammogram completed on 12/21/17 @ 12:20pm. Please call [phone] if you need to schedule your mammogram.  Bone Density: You are scheduled to have your Bone Density Exam completed on 12/21/17 @ 11:40am Recommended yearly ophthalmology/optometry visit for glaucoma screening and checkup Recommended yearly dental visit for hygiene and checkup  Vaccinations: Influenza vaccine: Up to date Pneumococcal vaccine: Up to date Tdap vaccine: Up to date Shingles vaccine: Declined Shingrix today. Please call your insurance company to determine your out of pocket expense.  Advanced directives: Advance directive discussed with you today. I have provided a copy for you to complete at home and have notarized. Once this is complete please bring a copy in to our office so we can scan it into your chart.  Conditions/risks identified: Fall risk prevention discussed  Next appointment: You are scheduled to see Dr. Army Melia on 03/01/18 @ 8:15am.   Please schedule your annual wellness exam with your Nurse Health Advisor in one year.    Preventive Care 41 Years and Older, Female Preventive care refers to lifestyle choices and visits with your health care provider that can promote health and wellness. What does preventive care include?  A yearly physical exam. This is also called an annual well check.  Dental exams once or twice a year.  Routine eye exams. Ask your health care provider how often you should have your eyes checked.  Personal lifestyle choices, including:  Daily care of your teeth and gums.  Regular  physical activity.  Eating a healthy diet.  Avoiding tobacco and drug use.  Limiting alcohol use.  Practicing safe sex.  Taking low-dose aspirin every day.  Taking vitamin and mineral supplements as recommended by your health care provider. What happens during an annual well check? The services and screenings done by your health care provider during your annual well check will depend on your age, overall health, lifestyle risk factors, and family history of disease. Counseling  Your health care provider may ask you questions about your:  Alcohol use.  Tobacco use.  Drug use.  Emotional well-being.  Home and relationship well-being.  Sexual activity.  Eating habits.  History of falls.  Memory and ability to understand (cognition).  Work and work Statistician.  Reproductive health. Screening  You may have the following tests or measurements:  Height, weight, and BMI.  Blood pressure.  Lipid and cholesterol levels. These may be checked every 5 years, or more frequently if you are over 40 years old.  Skin check.  Lung cancer screening. You may have this screening every year starting at age 69 if you have a 30-pack-year history of smoking and currently smoke or have quit within the past 15 years.  Fecal occult blood test (FOBT) of the stool. You may have this test every year starting at age 49.  Flexible sigmoidoscopy or colonoscopy. You may have a sigmoidoscopy every 5 years or a colonoscopy every 10 years starting at age 78.  Hepatitis C blood test.  Hepatitis B blood test.  Sexually transmitted disease (STD) testing.  Diabetes screening. This is done by checking your blood sugar (glucose) after you have not eaten  for a while (fasting). You may have this done every 1-3 years.  Bone density scan. This is done to screen for osteoporosis. You may have this done starting at age 66.  Mammogram. This may be done every 1-2 years. Talk to your health care  provider about how often you should have regular mammograms. Talk with your health care provider about your test results, treatment options, and if necessary, the need for more tests. Vaccines  Your health care provider may recommend certain vaccines, such as:  Influenza vaccine. This is recommended every year.  Tetanus, diphtheria, and acellular pertussis (Tdap, Td) vaccine. You may need a Td booster every 10 years.  Zoster vaccine. You may need this after age 67.  Pneumococcal 13-valent conjugate (PCV13) vaccine. One dose is recommended after age 64.  Pneumococcal polysaccharide (PPSV23) vaccine. One dose is recommended after age 68. Talk to your health care provider about which screenings and vaccines you need and how often you need them. This information is not intended to replace advice given to you by your health care provider. Make sure you discuss any questions you have with your health care provider. Document Released: 01/08/2016 Document Revised: 08/31/2016 Document Reviewed: 10/13/2015 Elsevier Interactive Patient Education  2017 Reader Prevention in the Home Falls can cause injuries. They can happen to people of all ages. There are many things you can do to make your home safe and to help prevent falls. What can I do on the outside of my home?  Regularly fix the edges of walkways and driveways and fix any cracks.  Remove anything that might make you trip as you walk through a door, such as a raised step or threshold.  Trim any bushes or trees on the path to your home.  Use bright outdoor lighting.  Clear any walking paths of anything that might make someone trip, such as rocks or tools.  Regularly check to see if handrails are loose or broken. Make sure that both sides of any steps have handrails.  Any raised decks and porches should have guardrails on the edges.  Have any leaves, snow, or ice cleared regularly.  Use sand or salt on walking paths  during winter.  Clean up any spills in your garage right away. This includes oil or grease spills. What can I do in the bathroom?  Use night lights.  Install grab bars by the toilet and in the tub and shower. Do not use towel bars as grab bars.  Use non-skid mats or decals in the tub or shower.  If you need to sit down in the shower, use a plastic, non-slip stool.  Keep the floor dry. Clean up any water that spills on the floor as soon as it happens.  Remove soap buildup in the tub or shower regularly.  Attach bath mats securely with double-sided non-slip rug tape.  Do not have throw rugs and other things on the floor that can make you trip. What can I do in the bedroom?  Use night lights.  Make sure that you have a light by your bed that is easy to reach.  Do not use any sheets or blankets that are too big for your bed. They should not hang down onto the floor.  Have a firm chair that has side arms. You can use this for support while you get dressed.  Do not have throw rugs and other things on the floor that can make you trip. What can I do  in the kitchen?  Clean up any spills right away.  Avoid walking on wet floors.  Keep items that you use a lot in easy-to-reach places.  If you need to reach something above you, use a strong step stool that has a grab bar.  Keep electrical cords out of the way.  Do not use floor polish or wax that makes floors slippery. If you must use wax, use non-skid floor wax.  Do not have throw rugs and other things on the floor that can make you trip. What can I do with my stairs?  Do not leave any items on the stairs.  Make sure that there are handrails on both sides of the stairs and use them. Fix handrails that are broken or loose. Make sure that handrails are as long as the stairways.  Check any carpeting to make sure that it is firmly attached to the stairs. Fix any carpet that is loose or worn.  Avoid having throw rugs at the top  or bottom of the stairs. If you do have throw rugs, attach them to the floor with carpet tape.  Make sure that you have a light switch at the top of the stairs and the bottom of the stairs. If you do not have them, ask someone to add them for you. What else can I do to help prevent falls?  Wear shoes that:  Do not have high heels.  Have rubber bottoms.  Are comfortable and fit you well.  Are closed at the toe. Do not wear sandals.  If you use a stepladder:  Make sure that it is fully opened. Do not climb a closed stepladder.  Make sure that both sides of the stepladder are locked into place.  Ask someone to hold it for you, if possible.  Clearly mark and make sure that you can see:  Any grab bars or handrails.  First and last steps.  Where the edge of each step is.  Use tools that help you move around (mobility aids) if they are needed. These include:  Canes.  Walkers.  Scooters.  Crutches.  Turn on the lights when you go into a dark area. Replace any light bulbs as soon as they burn out.  Set up your furniture so you have a clear path. Avoid moving your furniture around.  If any of your floors are uneven, fix them.  If there are any pets around you, be aware of where they are.  Review your medicines with your doctor. Some medicines can make you feel dizzy. This can increase your chance of falling. Ask your doctor what other things that you can do to help prevent falls. This information is not intended to replace advice given to you by your health care provider. Make sure you discuss any questions you have with your health care provider. Document Released: 10/08/2009 Document Revised: 05/19/2016 Document Reviewed: 01/16/2015 Elsevier Interactive Patient Education  2017 [employer].

## 2017-11-13 NOTE — Progress Notes (Signed)
Subjective:   Samantha Lang is a 76 y.o. female who presents for Medicare Annual (Subsequent) preventive examination.  Review of Systems:  N/A Cardiac Risk Factors include: advanced age (>64men, >61 women);diabetes mellitus;dyslipidemia;hypertension;obesity (BMI >30kg/m2);sedentary lifestyle     Objective:     Vitals: BP 138/60 (BP Location: Right Arm, Patient Position: Sitting, Cuff Size: Normal)   Pulse 72   Temp 97.6 F (36.4 C) (Oral)   Resp 16   Ht 5\' 2"  (1.575 m)   Wt 174 lb (78.9 kg)   BMI 31.83 kg/m   Body mass index is 31.83 kg/m.   Tobacco Social History   Tobacco Use  Smoking Status Former Smoker  . Packs/day: 0.25  . Years: 20.00  . Pack years: 5.00  . Types: Cigarettes  . Last attempt to quit: 09/25/1988  . Years since quitting: 29.1  Smokeless Tobacco Never Used     Counseling given: No   Past Medical History:  Diagnosis Date  . Allergy   . Anxiety   . Aortic valve stenosis, mild   . Depression    mild  . Diabetes mellitus age 56  . GERD (gastroesophageal reflux disease)   . Heart murmur   . Hemorrhoids   . Hyperlipidemia   . Hypertension 2003  . Incontinence    Female stress  . Mitral incompetence   . Postmenopausal atrophic vaginitis   . Uterine cancer [facility])    treated   Past Surgical History:  Procedure Laterality Date  . BREAST BIOPSY Right 03/21/08   right, benign   . COLONOSCOPY WITH PROPOFOL N/A 09/22/2017   Performed by Lucilla Lame, MD at [facility]  . EYE SURGERY  Oct 2009   for ptosis,  Dr. Rosaria Ferries, eyelid lift  . POLYPECTOMY  09/22/2017   Performed by Lucilla Lame, MD at [facility]  . TUBAL LIGATION     Family History  Problem Relation Age of Onset  . Breast cancer Paternal Aunt   . Breast cancer Paternal Grandmother   . Cancer Father 80       lung  . Stroke Mother   . Hypertension Mother   . Cancer Brother   . Depression Brother   . Ovarian cancer Neg Hx   . Colon cancer Neg Hx   .  Diabetes Neg Hx    Social History   Substance and Sexual Activity  Sexual Activity Yes  . Birth control/protection: Post-menopausal    Outpatient Encounter Medications as of 11/13/2017  Medication Sig  . amLODipine (NORVASC) 5 MG tablet TAKE 1 TABLET(5 MG) BY MOUTH EVERY DAY  . aspirin 81 MG tablet Take 81 mg by mouth daily.  Marland Kitchen atorvastatin (LIPITOR) 10 MG tablet Take 1 tablet by mouth daily.  . carvedilol (COREG) 3.125 MG tablet TAKE 1 TABLET(3.125 MG) BY MOUTH TWICE DAILY WITH MEALS  . diazepam (VALIUM) 5 MG tablet Take 1 tablet (5 mg total) by mouth at bedtime as needed.  Marland Kitchen JANUVIA 100 MG tablet TAKE 1 TABLET BY MOUTH EVERY DAY  . pantoprazole (PROTONIX) 40 MG tablet TAKE 1 TABLET BY MOUTH DAILY  . telmisartan-hydrochlorothiazide (MICARDIS HCT) 80-25 MG tablet Take 1 tablet by mouth daily.  Marland Kitchen venlafaxine XR (EFFEXOR-XR) 150 MG 24 hr capsule TAKE 1 CAPSULE BY MOUTH DAILY   No facility-administered encounter medications on file as of 11/13/2017.     Activities of Daily Living In your present state of health, do you have any difficulty performing the following activities: 11/13/2017 09/22/2017  Hearing? N N  Vision? N N  Difficulty concentrating or making decisions? Y N  Comment occassional short term memory loss -  Walking or climbing stairs? N N  Dressing or bathing? N N  Doing errands, shopping? N -  Preparing Food and eating ? N -  Using the Toilet? N -  In the past six months, have you accidently leaked urine? Y -  Comment wears pads -  Do you have problems with loss of bowel control? Y -  Comment occassional loose stools -  Managing your Medications? N -  Managing your Finances? N -  Housekeeping or managing your Housekeeping? N -  Some recent data might be hidden    Patient Care Team: Glean Hess, MD as PCP - General (Family Medicine)    Assessment:     Exercise Activities and Dietary recommendations Current Exercise Habits: The patient does not  participate in regular exercise at present, Exercise limited by: None identified  Goals    . Exercise 150 min/wk Moderate Activity     Recommend to exercise at least 3-4 times per week for at least 30-40 minutes      Fall Risk Fall Risk  11/13/2017 07/13/2017 07/11/2016 10/02/2015  Falls in the past year? No No No No   Depression Screen PHQ 2/9 Scores 11/13/2017 11/01/2017 07/13/2017 07/11/2016  PHQ - 2 Score 1 0 0 0  PHQ- 9 Score - 0 - -     Cognitive Function     6CIT Screen 11/13/2017  What Year? 0 points  What month? 0 points  What time? 3 points  Count back from 20 0 points  Months in reverse 0 points  Repeat phrase 4 points  Total Score 7    Immunization History  Administered Date(s) Administered  . Influenza,inj,Quad PF,6+ Mos 10/02/2015, 11/07/2016, 11/01/2017  . Pneumococcal Conjugate-13 02/05/2014  . Pneumococcal Polysaccharide-23 06/09/2008  . Tdap 06/09/2010   Screening Tests Health Maintenance  Topic Date Due  . OPHTHALMOLOGY EXAM  03/08/2017  . HEMOGLOBIN A1C  05/01/2018  . FOOT EXAM  11/01/2018  . TETANUS/TDAP  06/09/2020  . COLONOSCOPY  09/23/2027  . INFLUENZA VACCINE  Completed  . PNA vac Low Risk Adult  Completed  . DEXA SCAN  Addressed      Plan:  I have personally reviewed and addressed the Medicare Annual Wellness questionnaire and have noted the following in the patient's chart:  A. Medical and social history B. Use of alcohol, tobacco or illicit drugs  C. Current medications and supplements D. Functional ability and status E.  Nutritional status F.  Physical activity G. Advance directives H. List of other physicians I.  Hospitalizations, surgeries, and ER visits in previous 12 months J.  [place] such as hearing and vision if needed, cognitive and depression L. Referrals and appointments - none  In addition, I have reviewed and discussed with patient certain preventive protocols, quality metrics, and best practice  recommendations. A written personalized care plan for preventive services as well as general preventive health recommendations were provided to patient.  See attached scanned questionnaire for additional information.   Signed,  Aleatha Borer, LPN Nurse Health Advisor  Nurse Notes: Due for Shingrix. Declined. Pt advised to call her insurance company to determine her out of pocket expense. Advised she may also receive this vaccine at her local pharmacy or Health Dept. Up to date for all other vaccines.  Up to date for all screenings at this time. Scheduled to have  her mammogram and DEXA completed on 12/21/17. Pt states she is in the process of moving. May need to reschedule this appt. Provided pt with with contact info for rescheduling purposes.

## 2017-11-15 ENCOUNTER — Ambulatory Visit: Payer: Medicare HMO | Admitting: Internal Medicine

## 2017-11-23 DIAGNOSIS — R69 Illness, unspecified: Secondary | ICD-10-CM | POA: Diagnosis not present

## 2017-11-23 DIAGNOSIS — C541 Malignant neoplasm of endometrium: Secondary | ICD-10-CM | POA: Diagnosis not present

## 2017-12-21 ENCOUNTER — Other Ambulatory Visit: Payer: Medicare HMO

## 2017-12-21 ENCOUNTER — Inpatient Hospital Stay: Admission: RE | Admit: 2017-12-21 | Payer: Medicare HMO | Source: Ambulatory Visit

## 2018-01-12 ENCOUNTER — Other Ambulatory Visit: Payer: Self-pay

## 2018-01-12 MED ORDER — VENLAFAXINE HCL ER 150 MG PO CP24: 150.0000 mg | ORAL_CAPSULE | Freq: Every day | ORAL | 5 refills | Status: DC

## 2018-02-15 DIAGNOSIS — I10 Essential (primary) hypertension: Secondary | ICD-10-CM | POA: Diagnosis not present

## 2018-02-15 DIAGNOSIS — E119 Type 2 diabetes mellitus without complications: Secondary | ICD-10-CM | POA: Diagnosis not present

## 2018-02-15 DIAGNOSIS — I340 Nonrheumatic mitral (valve) insufficiency: Secondary | ICD-10-CM | POA: Diagnosis not present

## 2018-02-15 DIAGNOSIS — E782 Mixed hyperlipidemia: Secondary | ICD-10-CM | POA: Diagnosis not present

## 2018-02-19 ENCOUNTER — Inpatient Hospital Stay: Admission: RE | Admit: 2018-02-19 | Payer: Medicare HMO | Source: Ambulatory Visit

## 2018-02-19 ENCOUNTER — Ambulatory Visit: Admission: RE | Admit: 2018-02-19 | Payer: Medicare HMO | Source: Ambulatory Visit

## 2018-02-22 ENCOUNTER — Encounter: Payer: Self-pay | Admitting: Internal Medicine

## 2018-02-27 ENCOUNTER — Ambulatory Visit
Admission: RE | Admit: 2018-02-27 | Discharge: 2018-02-27 | Disposition: A | Discharge status: Home / Self Care | Payer: Medicare HMO | Source: Ambulatory Visit | Attending: Internal Medicine | Admitting: Internal Medicine

## 2018-02-27 DIAGNOSIS — Z1231 Encounter for screening mammogram for malignant neoplasm of breast: Secondary | ICD-10-CM | POA: Diagnosis not present

## 2018-02-27 DIAGNOSIS — E2839 Other primary ovarian failure: Secondary | ICD-10-CM | POA: Diagnosis not present

## 2018-02-27 DIAGNOSIS — M85851 Other specified disorders of bone density and structure, right thigh: Secondary | ICD-10-CM | POA: Diagnosis not present

## 2018-02-27 DIAGNOSIS — Z780 Asymptomatic menopausal state: Secondary | ICD-10-CM | POA: Diagnosis not present

## 2018-02-27 HISTORY — DX: Personal history of irradiation: Z92.3

## 2018-02-27 HISTORY — DX: Personal history of antineoplastic chemotherapy: Z92.21

## 2018-03-01 ENCOUNTER — Encounter: Payer: Self-pay | Admitting: Internal Medicine

## 2018-03-01 ENCOUNTER — Ambulatory Visit (INDEPENDENT_AMBULATORY_CARE_PROVIDER_SITE_OTHER): Payer: Medicare HMO | Admitting: Internal Medicine

## 2018-03-01 VITALS — BP 124/70 | HR 71 | Ht 62.0 in | Wt 176.0 lb

## 2018-03-01 DIAGNOSIS — E1169 Type 2 diabetes mellitus with other specified complication: Secondary | ICD-10-CM

## 2018-03-01 DIAGNOSIS — R809 Proteinuria, unspecified: Secondary | ICD-10-CM | POA: Diagnosis not present

## 2018-03-01 DIAGNOSIS — E119 Type 2 diabetes mellitus without complications: Secondary | ICD-10-CM | POA: Diagnosis not present

## 2018-03-01 DIAGNOSIS — I10 Essential (primary) hypertension: Secondary | ICD-10-CM | POA: Diagnosis not present

## 2018-03-01 DIAGNOSIS — E785 Hyperlipidemia, unspecified: Secondary | ICD-10-CM | POA: Diagnosis not present

## 2018-03-01 DIAGNOSIS — E1129 Type 2 diabetes mellitus with other diabetic kidney complication: Secondary | ICD-10-CM

## 2018-03-01 NOTE — Patient Instructions (Signed)
Schedule diabetic eye exam

## 2018-03-01 NOTE — Progress Notes (Signed)
Date:  03/01/2018   Name:  Samantha Lang   DOB:  Sep 01, 1941   MRN:  [id number]   Chief Complaint: Diabetes and Hypertension Diabetes  She has type 2 diabetes mellitus. Pertinent negatives for hypoglycemia include no headaches or tremors. Pertinent negatives for diabetes include no chest pain, no fatigue, no polydipsia and no polyuria. Diabetic complications include nephropathy. Current diabetic treatment includes oral agent (monotherapy) Celesta Gentile). She is compliant with treatment all of the time. Her weight is stable. She is following a generally healthy diet. An ACE inhibitor/angiotensin II receptor blocker is being taken.  Hypertension  This is a chronic problem. The problem is unchanged. The problem is controlled. Pertinent negatives include no chest pain, headaches, palpitations or shortness of breath. Past treatments include beta blockers, calcium channel blockers, angiotensin blockers and diuretics. The current treatment provides significant improvement.  Hyperlipidemia  This is a chronic problem. Pertinent negatives include no chest pain or shortness of breath. Current antihyperlipidemic treatment includes statins. There are no compliance problems.    Lab Results  Component Value Date   HGBA1C 6.7 (H) 11/01/2017   Lab Results  Component Value Date   CREATININE 0.93 11/01/2017   BUN 24 11/01/2017   NA 138 11/01/2017   K 3.7 11/01/2017   CL 98 11/01/2017   CO2 26 11/01/2017   Lab Results  Component Value Date   CHOL 177 10/02/2015   HDL 48 10/02/2015   LDLCALC 83 10/02/2015   LDLDIRECT 138.3 09/26/2011   TRIG 229 (H) 10/02/2015   CHOLHDL 3.7 10/02/2015      Review of Systems  Constitutional: Negative for appetite change, fatigue, fever and unexpected weight change.  HENT: Negative for tinnitus and trouble swallowing.   Eyes: Negative for visual disturbance.  Respiratory: Negative for cough, chest tightness and shortness of breath.   Cardiovascular: Negative for  chest pain, palpitations and leg swelling.  Gastrointestinal: Negative for abdominal pain.  Endocrine: Negative for polydipsia and polyuria.  Genitourinary: Negative for dysuria and hematuria.  Musculoskeletal: Negative for arthralgias.  Neurological: Negative for tremors, numbness and headaches.  Psychiatric/Behavioral: Negative for dysphoric mood.    Patient Active Problem List   Diagnosis Date Noted  . Colon cancer screening   . Trochanteric bursitis of left hip 12/07/2016  . History of endometrial cancer 08/03/2016  . Edema leg 04/13/2016  . DM type 2 without retinopathy ([place]) 03/14/2016  . Shoulder pain, left 02/12/2016  . Acid reflux 10/02/2015  . 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 Hyperlipidemia associated with type 2 diabetes mellitus ([place]) 04/08/2015  . Aortic heart valve narrowing 03/26/2015  . Carotid artery narrowing 03/26/2015  . Bilateral carotid artery stenosis 03/26/2015  . Polyarthralgia 09/27/2011  . Anxiety   . Environmental and seasonal allergies   . Postmenopausal atrophic vaginitis   . Depression   . 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 Essential hypertension     Prior to Admission medications   Medication Sig Start Date End Date Taking? Authorizing Provider  amLODipine (NORVASC) 5 MG tablet TAKE 1 TABLET(5 MG) BY MOUTH EVERY DAY 08/23/16   [provider]  aspirin 81 MG tablet Take 81 mg by mouth daily.    [provider]  atorvastatin (LIPITOR) 10 MG tablet Take 1 tablet by mouth daily. 07/12/15   [provider]  carvedilol (COREG) 3.125 MG tablet TAKE 1 TABLET(3.125 MG) BY MOUTH TWICE DAILY WITH MEALS 10/20/16  [provider]  diazepam (VALIUM) 5 MG tablet Take 1 tablet (5 mg total) by mouth at bedtime as needed. 08/15/17   Glean Hess, MD  JANUVIA 100 MG tablet TAKE 1 TABLET BY MOUTH EVERY DAY 10/20/16   Glean Hess, MD        Glean Hess, MD  telmisartan-hydrochlorothiazide (MICARDIS HCT) 80-25 MG tablet Take 1 tablet by mouth daily. 06/08/17   [provider]  venlafaxine XR (EFFEXOR-XR) 150 MG 24 hr capsule Take 1 capsule (150 mg total) by mouth daily. 01/12/18   Glean Hess, MD    Allergies  Allergen Reactions  . Lipitor [Atorvastatin Calcium]     Legs ache  With higher dose  . Cephalexin Rash  . Clarithromycin Rash    Past Surgical History:  Procedure Laterality Date  . ABDOMINAL HYSTERECTOMY  07/2016  . BREAST BIOPSY Right 03/21/08   right, benign   . COLONOSCOPY WITH PROPOFOL N/A 09/22/2017   Procedure: COLONOSCOPY WITH PROPOFOL;  Surgeon: Lucilla Lame, MD;  Location: [facility];  Service: Gastroenterology;  Laterality: N/A;  diabetic  . EYE SURGERY  Oct 2009   for ptosis,  Dr. Rosaria Ferries, eyelid lift  . POLYPECTOMY  09/22/2017   Procedure: POLYPECTOMY;  Surgeon: Lucilla Lame, MD;  Location: [place];  Service: Gastroenterology;;  . TUBAL LIGATION      Social History   Tobacco Use  . Smoking status: Former Smoker    Packs/day: 0.25    Years: 20.00    Pack years: 5.00    Types: Cigarettes    Last attempt to quit: 09/25/1988    Years since quitting: 29.4  . Smokeless tobacco: Never Used  Substance Use Topics  . Alcohol use: Yes    Alcohol/week: 3.6 oz    Types: 2 Shots of liquor, 2 Cans of beer, 2 Glasses of wine per week  . Drug use: No     Medication list has been reviewed and updated.  PHQ 2/9 Scores 03/01/2018 11/13/2017 11/01/2017 07/13/2017  PHQ - 2 Score 1 1 0 0  PHQ- 9 Score 1 - 0 -    Physical Exam  Constitutional: She is oriented to person, place, and time. She appears well-developed. No distress.  HENT:  Head: Normocephalic and atraumatic.  Neck: Normal range of motion. Neck supple. Carotid bruit is not present. No thyromegaly present.  Cardiovascular: Normal rate, regular rhythm and normal heart sounds.  Pulmonary/Chest: Effort  normal. No respiratory distress. She has no wheezes.  Musculoskeletal: Normal range of motion. She exhibits no edema.  Neurological: She is alert and oriented to person, place, and time.  Skin: Skin is warm and dry. No rash noted.  Psychiatric: She has a normal mood and affect. Her behavior is normal. Thought content normal.  Nursing note and vitals reviewed.   BP 124/70   Pulse 71   Ht 5\' 2"  (1.575 m)   Wt 176 lb (79.8 kg)   SpO2 97%   BMI 32.19 kg/m   Assessment and Plan: 1. DM type 2 without retinopathy (HCC) Continue oral agents, diet and exercise - Comprehensive metabolic panel - Hemoglobin A1c  2. 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 Essential hypertension controlled  4. Hyperlipidemia associated with type 2 diabetes mellitus ([place]) Continue statin therapy - Lipid panel   No orders of the defined types were placed in this encounter.   Partially dictated using Editor, commissioning. Any errors are unintentional.  Halina Maidens, MD [facility]  03/01/2018

## 2018-03-02 LAB — HEMOGLOBIN A1C
ESTIMATED AVERAGE GLUCOSE: 134 mg/dL
Hgb A1c MFr Bld: 6.3 % — ABNORMAL HIGH (ref 4.8–5.6)

## 2018-03-02 LAB — COMPREHENSIVE METABOLIC PANEL
ALT: 36 IU/L — ABNORMAL HIGH (ref 0–32)
AST: 27 IU/L (ref 0–40)
Albumin/Globulin Ratio: 1.7 (ref 1.2–2.2)
Albumin: 4.7 g/dL (ref 3.5–4.8)
Alkaline Phosphatase: 87 IU/L (ref 39–117)
BILIRUBIN TOTAL: 0.6 mg/dL (ref 0.0–1.2)
BUN/Creatinine Ratio: 27 (ref 12–28)
BUN: 20 mg/dL (ref 8–27)
CALCIUM: 9.6 mg/dL (ref 8.7–10.3)
CHLORIDE: 97 mmol/L (ref 96–106)
CO2: 26 mmol/L (ref 20–29)
Creatinine, Ser: 0.74 mg/dL (ref 0.57–1.00)
GFR, EST AFRICAN AMERICAN: 92 mL/min/{1.73_m2} (ref >59)
GFR, EST NON AFRICAN AMERICAN: 80 mL/min/{1.73_m2} (ref >59)
GLOBULIN, TOTAL: 2.7 g/dL (ref 1.5–4.5)
Glucose: 141 mg/dL — ABNORMAL HIGH (ref 65–99)
POTASSIUM: 4.1 mmol/L (ref 3.5–5.2)
SODIUM: 139 mmol/L (ref 134–144)
Total Protein: 7.4 g/dL (ref 6.0–8.5)

## 2018-03-02 LAB — LIPID PANEL
Chol/HDL Ratio: 2.6 ratio (ref 0.0–4.4)
Cholesterol, Total: 180 mg/dL (ref 100–199)
HDL: 70 mg/dL (ref >39)
LDL CALC: 85 mg/dL (ref 0–99)
Triglycerides: 125 mg/dL (ref 0–149)
VLDL CHOLESTEROL CAL: 25 mg/dL (ref 5–40)

## 2018-03-28 DIAGNOSIS — C549 Malignant neoplasm of corpus uteri, unspecified: Secondary | ICD-10-CM | POA: Diagnosis not present

## 2018-03-28 DIAGNOSIS — C541 Malignant neoplasm of endometrium: Secondary | ICD-10-CM | POA: Diagnosis not present

## 2018-03-29 DIAGNOSIS — R69 Illness, unspecified: Secondary | ICD-10-CM | POA: Diagnosis not present

## 2018-04-11 DIAGNOSIS — R69 Illness, unspecified: Secondary | ICD-10-CM | POA: Diagnosis not present

## 2018-05-15 ENCOUNTER — Encounter: Payer: Self-pay | Admitting: Internal Medicine

## 2018-05-15 ENCOUNTER — Ambulatory Visit (INDEPENDENT_AMBULATORY_CARE_PROVIDER_SITE_OTHER): Payer: Medicare HMO | Admitting: Internal Medicine

## 2018-05-15 VITALS — BP 122/78 | HR 74 | Resp 16 | Ht 62.0 in | Wt 179.0 lb

## 2018-05-15 DIAGNOSIS — R1030 Lower abdominal pain, unspecified: Secondary | ICD-10-CM | POA: Diagnosis not present

## 2018-05-15 DIAGNOSIS — F419 Anxiety disorder, unspecified: Secondary | ICD-10-CM

## 2018-05-15 DIAGNOSIS — R69 Illness, unspecified: Secondary | ICD-10-CM | POA: Diagnosis not present

## 2018-05-15 LAB — POCT URINALYSIS DIPSTICK
BILIRUBIN UA: NEGATIVE
Blood, UA: NEGATIVE
GLUCOSE UA: NEGATIVE
Ketones, UA: NEGATIVE
LEUKOCYTES UA: NEGATIVE
Nitrite, UA: NEGATIVE
Protein, UA: NEGATIVE
Spec Grav, UA: 1.01 (ref 1.010–1.025)
Urobilinogen, UA: 0.2 E.U./dL
pH, UA: 6 (ref 5.0–8.0)

## 2018-05-15 MED ORDER — DIAZEPAM 5 MG PO TABS: 5.0000 mg | ORAL_TABLET | Freq: Every evening | ORAL | 0 refills | Status: DC | PRN

## 2018-05-15 MED ORDER — AMOXICILLIN-POT CLAVULANATE 875-125 MG PO TABS: 1.0000 | ORAL_TABLET | Freq: Two times a day (BID) | ORAL | 0 refills | Status: AC

## 2018-05-15 NOTE — Progress Notes (Signed)
Date:  05/15/2018   Name:  Samantha Lang   DOB:  [birth year]   MRN:  [id number]   Chief Complaint: GI Problem (Complains of stomach pains that started Sat AM having gas and sharp pains. Hx Uterine Cancer so she is afraid of stomach pain. Has BM every 3 days but Sat had some cramps and mucus was al she did with her BM and some Bright Red Blood. Ate collards and then had a blow out on Sunday of soft stool. ) and Anxiety (Wants refill Valium as she is looking at houses and stressed out severe. )  Abdominal Pain  This is a new problem. The current episode started in the past 7 days. The onset quality is sudden. The problem occurs intermittently. The quality of the pain is aching. Associated symptoms include constipation and diarrhea (the next day had diarrhea with a very large stool). Pertinent negatives include no fever, headaches or vomiting. Associated symptoms comments: Hard small balls of stool with some mucus and minor bleeding.  Anxiety  Presents for initial visit. Onset was in the past 7 days. Symptoms include excessive worry and nervous/anxious behavior. Patient reports no chest pain, dizziness, palpitations or shortness of breath. Symptoms occur most days. The symptoms are aggravated by social activities (tryng to buy a house).      Review of Systems  Constitutional: Negative for appetite change, chills, fatigue and fever.  Respiratory: Negative for chest tightness and shortness of breath.   Cardiovascular: Negative for chest pain and palpitations.  Gastrointestinal: Positive for abdominal pain, constipation and diarrhea (the next day had diarrhea with a very large stool). Negative for vomiting.  Neurological: Negative for dizziness and headaches.  Psychiatric/Behavioral: Positive for sleep disturbance. Negative for dysphoric mood. The patient is nervous/anxious.     Patient Active Problem List   Diagnosis Date Noted  . Colon cancer screening   . Trochanteric bursitis of left  hip 12/07/2016  . History of endometrial cancer 08/03/2016  . Edema leg 04/13/2016  . DM type 2 without retinopathy ([place]) 03/14/2016  . Shoulder pain, left 02/12/2016  . Acid reflux 10/02/2015  . 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 Hyperlipidemia associated with type 2 diabetes mellitus ([place]) 04/08/2015  . Aortic heart valve narrowing 03/26/2015  . Carotid artery narrowing 03/26/2015  . Bilateral carotid artery stenosis 03/26/2015  . Polyarthralgia 09/27/2011  . Anxiety   . Environmental and seasonal allergies   . Postmenopausal atrophic vaginitis   . Depression   . 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 Essential hypertension     Prior to Admission medications   Medication Sig Start Date End Date Taking? Authorizing Provider  amLODipine (NORVASC) 5 MG tablet TAKE 1 TABLET(5 MG) BY MOUTH EVERY DAY 08/23/16  Yes [provider]  aspirin 81 MG tablet Take 81 mg by mouth daily.   Yes [provider]  atorvastatin (LIPITOR) 10 MG tablet Take 1 tablet by mouth daily. 07/12/15  Yes [provider]  carvedilol (COREG) 3.125 MG tablet TAKE 1 TABLET(3.125 MG) BY MOUTH TWICE DAILY WITH MEALS 10/20/16  Yes [provider]  diazepam (VALIUM) 5 MG tablet Take 1 tablet (5 mg total) by mouth at bedtime as needed. 08/15/17  Yes Glean Hess, MD  JANUVIA 100 MG tablet TAKE 1 TABLET BY MOUTH EVERY DAY 10/20/16  Yes Glean Hess, MD  Misc Natural Products (FIBER 7) POWD Take by mouth.  Yes [provider]  telmisartan-hydrochlorothiazide (MICARDIS HCT) 80-25 MG tablet Take 1 tablet by mouth daily. 06/08/17  Yes [provider]  venlafaxine XR (EFFEXOR-XR) 150 MG 24 hr capsule Take 1 capsule (150 mg total) by mouth daily. 01/12/18  Yes Glean Hess, MD    Allergies  Allergen Reactions  . Lipitor [Atorvastatin Calcium]     Legs ache  With higher dose  .  Cephalexin Rash  . Clarithromycin Rash    Past Surgical History:  Procedure Laterality Date  . ABDOMINAL HYSTERECTOMY  07/2016  . BREAST BIOPSY Right 03/21/08   right, benign   . COLONOSCOPY WITH PROPOFOL N/A 09/22/2017   Procedure: COLONOSCOPY WITH PROPOFOL;  Surgeon: Lucilla Lame, MD;  Location: [place];  Service: Gastroenterology;  Laterality: N/A;  diabetic  . EYE SURGERY  Oct 2009   for ptosis,  Dr. Rosaria Ferries, eyelid lift  . POLYPECTOMY  09/22/2017   Procedure: POLYPECTOMY;  Surgeon: Lucilla Lame, MD;  Location: [place];  Service: Gastroenterology;;  . TUBAL LIGATION      Social History   Tobacco Use  . Smoking status: Former Smoker    Packs/day: 0.25    Years: 20.00    Pack years: 5.00    Types: Cigarettes    Last attempt to quit: 09/25/1988    Years since quitting: 29.6  . Smokeless tobacco: Never Used  Substance Use Topics  . Alcohol use: Yes    Alcohol/week: 3.6 oz    Types: 2 Shots of liquor, 2 Cans of beer, 2 Glasses of wine per week  . Drug use: No     Medication list has been reviewed and updated.  PHQ 2/9 Scores 03/01/2018 11/13/2017 11/01/2017 07/13/2017  PHQ - 2 Score 1 1 0 0  PHQ- 9 Score 1 - 0 -    Physical Exam  Constitutional: She is oriented to person, place, and time. She appears well-developed. No distress.  HENT:  Head: Normocephalic and atraumatic.  Neck: Normal range of motion. Neck supple.  Cardiovascular: Normal rate, regular rhythm and normal heart sounds.  Pulmonary/Chest: Effort normal and breath sounds normal. No stridor. No respiratory distress.  Abdominal: Soft. Normal appearance. There is no hepatosplenomegaly. There is tenderness in the left lower quadrant. There is no rigidity, no guarding and negative Murphy's sign.  Musculoskeletal: Normal range of motion.  Neurological: She is alert and oriented to person, place, and time.  Skin: Skin is warm and dry. No rash noted.  Psychiatric: She has a normal mood and  affect. Her behavior is normal. Thought content normal.  Nursing note and vitals reviewed.   BP 122/78   Pulse 74   Resp 16   Ht 5\' 2"  (1.575 m)   Wt 179 lb (81.2 kg)   SpO2 97%   BMI 32.74 kg/m   Assessment and Plan: 1. Lower abdominal pain Suspect mild diverticulitis Begin stool softener or other regimen for constipation - amoxicillin-clavulanate (AUGMENTIN) 875-125 MG tablet; Take 1 tablet by mouth 2 (two) times daily for 10 days.  Dispense: 20 tablet; Refill: 0 - POCT urinalysis dipstick  2. Anxiety - diazepam (VALIUM) 5 MG tablet; Take 1 tablet (5 mg total) by mouth at bedtime as needed.  Dispense: 30 tablet; Refill: 0   Meds ordered this encounter  Medications  . amoxicillin-clavulanate (AUGMENTIN) 875-125 MG tablet    Sig: Take 1 tablet by mouth 2 (two) times daily for 10 days.    Dispense:  20 tablet    Refill:  0  . diazepam (VALIUM) 5 MG tablet    Sig: Take 1 tablet (5 mg total) by mouth at bedtime as needed.    Dispense:  30 tablet    Refill:  0    Partially dictated using Editor, commissioning. Any errors are unintentional.  Halina Maidens, MD [facility]  05/15/2018

## 2018-05-24 ENCOUNTER — Encounter: Payer: Self-pay | Admitting: Internal Medicine

## 2018-05-31 DIAGNOSIS — I10 Essential (primary) hypertension: Secondary | ICD-10-CM | POA: Diagnosis not present

## 2018-05-31 DIAGNOSIS — E119 Type 2 diabetes mellitus without complications: Secondary | ICD-10-CM | POA: Diagnosis not present

## 2018-05-31 DIAGNOSIS — C541 Malignant neoplasm of endometrium: Secondary | ICD-10-CM | POA: Diagnosis not present

## 2018-05-31 DIAGNOSIS — K219 Gastro-esophageal reflux disease without esophagitis: Secondary | ICD-10-CM | POA: Diagnosis not present

## 2018-07-26 ENCOUNTER — Other Ambulatory Visit: Payer: Self-pay | Admitting: Internal Medicine

## 2018-08-08 DIAGNOSIS — I350 Nonrheumatic aortic (valve) stenosis: Secondary | ICD-10-CM | POA: Diagnosis not present

## 2018-08-08 DIAGNOSIS — I071 Rheumatic tricuspid insufficiency: Secondary | ICD-10-CM | POA: Diagnosis not present

## 2018-08-08 DIAGNOSIS — R002 Palpitations: Secondary | ICD-10-CM | POA: Insufficient documentation

## 2018-08-08 DIAGNOSIS — E782 Mixed hyperlipidemia: Secondary | ICD-10-CM | POA: Diagnosis not present

## 2018-08-08 DIAGNOSIS — I10 Essential (primary) hypertension: Secondary | ICD-10-CM | POA: Diagnosis not present

## 2018-08-08 DIAGNOSIS — I340 Nonrheumatic mitral (valve) insufficiency: Secondary | ICD-10-CM | POA: Diagnosis not present

## 2018-08-08 DIAGNOSIS — I6523 Occlusion and stenosis of bilateral carotid arteries: Secondary | ICD-10-CM | POA: Diagnosis not present

## 2018-08-19 ENCOUNTER — Other Ambulatory Visit: Payer: Self-pay | Admitting: Internal Medicine

## 2018-08-29 ENCOUNTER — Ambulatory Visit (INDEPENDENT_AMBULATORY_CARE_PROVIDER_SITE_OTHER): Payer: Medicare HMO | Admitting: Internal Medicine

## 2018-08-29 ENCOUNTER — Encounter: Payer: Self-pay | Admitting: Internal Medicine

## 2018-08-29 VITALS — BP 134/84 | HR 65 | Ht 62.0 in | Wt 172.0 lb

## 2018-08-29 DIAGNOSIS — M67472 Ganglion, left ankle and foot: Secondary | ICD-10-CM | POA: Diagnosis not present

## 2018-08-29 DIAGNOSIS — E1169 Type 2 diabetes mellitus with other specified complication: Secondary | ICD-10-CM | POA: Diagnosis not present

## 2018-08-29 DIAGNOSIS — I10 Essential (primary) hypertension: Secondary | ICD-10-CM | POA: Diagnosis not present

## 2018-08-29 DIAGNOSIS — L853 Xerosis cutis: Secondary | ICD-10-CM | POA: Diagnosis not present

## 2018-08-29 DIAGNOSIS — E785 Hyperlipidemia, unspecified: Secondary | ICD-10-CM

## 2018-08-29 DIAGNOSIS — E119 Type 2 diabetes mellitus without complications: Secondary | ICD-10-CM | POA: Diagnosis not present

## 2018-08-29 NOTE — Patient Instructions (Addendum)
Zyrtec or allegra or Claritin at bedtime daily  Schedule Diabetic Eye exam

## 2018-08-29 NOTE — Progress Notes (Signed)
Date:  08/29/2018   Name:  Samantha Lang   DOB:  [birth year]   MRN:  [id number]   Chief Complaint: Rash (Itchy dry skin. x 1 month. Arms, scalp, and stomach. Last time had itchy scalp she was diagnosed with cancer. ) and Foot Pain (Left, red, and hot to touch on side of foot. Painful. Concerned of gout. )  Rash  This is a new problem. The affected locations include the abdomen, right arm and left arm. The rash is characterized by itchiness and dryness. She was exposed to nothing. Pertinent negatives include no fatigue, fever or shortness of breath. Past treatments include nothing.  Foot Pain  This is a new (lump on lateral left foot) problem. The problem occurs intermittently. Associated symptoms include a rash. Pertinent negatives include no chest pain, chills, fatigue, fever or headaches.  Diabetes  She presents for her follow-up diabetic visit. She has type 2 diabetes mellitus. Her disease course has been stable. Pertinent negatives for hypoglycemia include no dizziness, headaches or nervousness/anxiousness. Pertinent negatives for diabetes include no chest pain and no fatigue. Current diabetic treatment includes oral agent (monotherapy). She is compliant with treatment all of the time. Her weight is stable. An ACE inhibitor/angiotensin II receptor blocker is being taken. Eye exam is not current.  Hypertension  This is a chronic problem. Pertinent negatives include no chest pain, headaches, palpitations or shortness of breath. Past treatments include calcium channel blockers, beta blockers, diuretics and angiotensin blockers. The current treatment provides significant improvement.  Hyperlipidemia  The problem is controlled. Pertinent negatives include no chest pain or shortness of breath. Current antihyperlipidemic treatment includes statins. The current treatment provides significant improvement of lipids.   Lab Results  Component Value Date   HGBA1C 6.3 (H) 03/01/2018   Lab Results    Component Value Date   CHOL 180 03/01/2018   HDL 70 03/01/2018   LDLCALC 85 03/01/2018   LDLDIRECT 138.3 09/26/2011   TRIG 125 03/01/2018   CHOLHDL 2.6 03/01/2018   Lab Results  Component Value Date   CREATININE 0.74 03/01/2018   BUN 20 03/01/2018   NA 139 03/01/2018   K 4.1 03/01/2018   CL 97 03/01/2018   CO2 26 03/01/2018     Review of Systems  Constitutional: Negative for chills, fatigue and fever.  Respiratory: Negative for chest tightness and shortness of breath.   Cardiovascular: Positive for leg swelling (mild ankle swelling since increasing dose of amlodipine). Negative for chest pain and palpitations.  Genitourinary: Negative for difficulty urinating.  Skin: Positive for color change, rash and wound (excoriations on arms and abdomen from scratching).  Allergic/Immunologic: Negative for environmental allergies and immunocompromised state.  Neurological: Negative for dizziness, light-headedness and headaches.  Psychiatric/Behavioral: Negative for dysphoric mood and sleep disturbance. The patient is not nervous/anxious.     Patient Active Problem List   Diagnosis Date Noted  . Colon cancer screening   . Trochanteric bursitis of left hip 12/07/2016  . History of endometrial cancer 08/03/2016  . Edema leg 04/13/2016  . DM type 2 without retinopathy ([place]) 03/14/2016  . Shoulder pain, left 02/12/2016  . Acid reflux 10/02/2015  . 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 Hyperlipidemia associated with type 2 diabetes mellitus ([place]) 04/08/2015  . Aortic heart valve narrowing 03/26/2015  . Carotid artery narrowing 03/26/2015  . Bilateral carotid artery stenosis 03/26/2015  . Polyarthralgia 09/27/2011  . Anxiety   . Environmental and seasonal allergies   .  Postmenopausal atrophic vaginitis   . Depression   . 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 Essential hypertension     Allergies   Allergen Reactions  . Lipitor [Atorvastatin Calcium]     Legs ache  With higher dose  . Cephalexin Rash  . Clarithromycin Rash    Past Surgical History:  Procedure Laterality Date  . ABDOMINAL HYSTERECTOMY  07/2016  . BREAST BIOPSY Right 03/21/08   right, benign   . COLONOSCOPY WITH PROPOFOL N/A 09/22/2017   Procedure: COLONOSCOPY WITH PROPOFOL;  Surgeon: Lucilla Lame, MD;  Location: [facility];  Service: Gastroenterology;  Laterality: N/A;  diabetic  . EYE SURGERY  Oct 2009   for ptosis,  Dr. Rosaria Ferries, eyelid lift  . POLYPECTOMY  09/22/2017   Procedure: POLYPECTOMY;  Surgeon: Lucilla Lame, MD;  Location: [facility];  Service: Gastroenterology;;  . TUBAL LIGATION      Social History   Tobacco Use  . Smoking status: Former Smoker    Packs/day: 0.25    Years: 20.00    Pack years: 5.00    Types: Cigarettes    Last attempt to quit: 09/25/1988    Years since quitting: 29.9  . Smokeless tobacco: Never Used  Substance Use Topics  . Alcohol use: Yes    Alcohol/week: 6.0 standard drinks    Types: 2 Shots of liquor, 2 Cans of beer, 2 Glasses of wine per week  . Drug use: No     Medication list has been reviewed and updated.  Current Meds  Medication Sig  . amLODipine (NORVASC) 5 MG tablet 5 mg 2 (two) times daily.   Marland Kitchen aspirin 81 MG tablet Take 81 mg by mouth daily.  Marland Kitchen atorvastatin (LIPITOR) 10 MG tablet Take 1 tablet by mouth daily.  . carvedilol (COREG) 3.125 MG tablet TAKE 1 TABLET(3.125 MG) BY MOUTH TWICE DAILY WITH MEALS  . diazepam (VALIUM) 5 MG tablet Take 1 tablet (5 mg total) by mouth at bedtime as needed.  Marland Kitchen JANUVIA 100 MG tablet TAKE 1 TABLET BY MOUTH EVERY DAY  . Misc Natural Products (FIBER 7) POWD Take by mouth.  . pantoprazole (PROTONIX) 40 MG tablet TAKE 1 TABLET BY MOUTH DAILY  . telmisartan-hydrochlorothiazide (MICARDIS HCT) 80-25 MG tablet Take 1 tablet by mouth daily.  Marland Kitchen venlafaxine XR (EFFEXOR-XR) 150 MG 24 hr capsule Take 1 capsule (150 mg  total) by mouth daily.    PHQ 2/9 Scores 08/29/2018 03/01/2018 11/13/2017 11/01/2017  PHQ - 2 Score 1 1 1  0  PHQ- 9 Score 1 1 - 0    Physical Exam  Constitutional: She is oriented to person, place, and time. She appears well-developed. No distress.  HENT:  Head: Normocephalic and atraumatic.  Neck: Normal range of motion. Neck supple.  Cardiovascular: Normal rate, regular rhythm and normal heart sounds.  Pulmonary/Chest: Effort normal and breath sounds normal. No respiratory distress.  Abdominal: Soft. Bowel sounds are normal.  Musculoskeletal: Normal range of motion.       Feet:  Lymphadenopathy:    She has no cervical adenopathy.  Neurological: She is alert and oriented to person, place, and time.  Skin: Skin is warm and dry. Rash noted.  Excoriations on abdomen, arms Skin dry - no other discrete lesions Scalp pink but otherwise normal  Psychiatric: She has a normal mood and affect. Her behavior is normal. Thought content normal.  Nursing note and vitals reviewed.   BP 134/84 (BP Location: Right Arm, Patient Position:  Sitting, Cuff Size: Normal)   Pulse 65   Ht 5\' 2"  (1.575 m)   Wt 172 lb (78 kg)   SpO2 98%   BMI 31.46 kg/m   Assessment and Plan: 1. Ganglion cyst of left foot Pt reassured  2. Xerosis of skin rec a dye and scent free lotion Zyrtec at bedtime to reduce scratching See Dermatology as planned  3. Hyperlipidemia associated with type 2 diabetes mellitus ([place]) On statin therapy - Lipid panel  4. DM type 2 without retinopathy ([place]) Controlled Pt to schedule Eye exam - Hemoglobin A1c - Comprehensive metabolic panel  5. Essential hypertension controlled   No orders of the defined types were placed in this encounter.   Partially dictated using Editor, commissioning. Any errors are unintentional.  Halina Maidens, MD [facility]  08/29/2018

## 2018-09-03 DIAGNOSIS — E1169 Type 2 diabetes mellitus with other specified complication: Secondary | ICD-10-CM | POA: Diagnosis not present

## 2018-09-03 DIAGNOSIS — E119 Type 2 diabetes mellitus without complications: Secondary | ICD-10-CM | POA: Diagnosis not present

## 2018-09-03 DIAGNOSIS — E785 Hyperlipidemia, unspecified: Secondary | ICD-10-CM | POA: Diagnosis not present

## 2018-09-04 LAB — COMPREHENSIVE METABOLIC PANEL
A/G RATIO: 1.9 (ref 1.2–2.2)
ALK PHOS: 81 IU/L (ref 39–117)
ALT: 26 IU/L (ref 0–32)
AST: 19 IU/L (ref 0–40)
Albumin: 4.5 g/dL (ref 3.5–4.8)
BILIRUBIN TOTAL: 0.4 mg/dL (ref 0.0–1.2)
BUN/Creatinine Ratio: 24 (ref 12–28)
BUN: 19 mg/dL (ref 8–27)
CHLORIDE: 98 mmol/L (ref 96–106)
CO2: 25 mmol/L (ref 20–29)
Calcium: 9.3 mg/dL (ref 8.7–10.3)
Creatinine, Ser: 0.79 mg/dL (ref 0.57–1.00)
GFR calc Af Amer: 85 mL/min/{1.73_m2} (ref >59)
GFR calc non Af Amer: 73 mL/min/{1.73_m2} (ref >59)
GLOBULIN, TOTAL: 2.4 g/dL (ref 1.5–4.5)
Glucose: 160 mg/dL — ABNORMAL HIGH (ref 65–99)
POTASSIUM: 3.8 mmol/L (ref 3.5–5.2)
SODIUM: 141 mmol/L (ref 134–144)
Total Protein: 6.9 g/dL (ref 6.0–8.5)

## 2018-09-04 LAB — LIPID PANEL
CHOL/HDL RATIO: 2.8 ratio (ref 0.0–4.4)
Cholesterol, Total: 179 mg/dL (ref 100–199)
HDL: 64 mg/dL (ref >39)
LDL Calculated: 81 mg/dL (ref 0–99)
Triglycerides: 168 mg/dL — ABNORMAL HIGH (ref 0–149)
VLDL CHOLESTEROL CAL: 34 mg/dL (ref 5–40)

## 2018-09-04 LAB — HEMOGLOBIN A1C
ESTIMATED AVERAGE GLUCOSE: 143 mg/dL
Hgb A1c MFr Bld: 6.6 % — ABNORMAL HIGH (ref 4.8–5.6)

## 2018-09-20 DIAGNOSIS — L218 Other seborrheic dermatitis: Secondary | ICD-10-CM | POA: Diagnosis not present

## 2018-09-20 DIAGNOSIS — L853 Xerosis cutis: Secondary | ICD-10-CM | POA: Diagnosis not present

## 2018-09-25 DIAGNOSIS — C549 Malignant neoplasm of corpus uteri, unspecified: Secondary | ICD-10-CM | POA: Diagnosis not present

## 2018-09-25 DIAGNOSIS — C541 Malignant neoplasm of endometrium: Secondary | ICD-10-CM | POA: Diagnosis not present

## 2018-10-10 ENCOUNTER — Ambulatory Visit (INDEPENDENT_AMBULATORY_CARE_PROVIDER_SITE_OTHER): Payer: Medicare HMO | Admitting: Internal Medicine

## 2018-10-10 ENCOUNTER — Encounter: Payer: Self-pay | Admitting: Internal Medicine

## 2018-10-10 VITALS — BP 142/84 | HR 66 | Ht 62.0 in | Wt 175.0 lb

## 2018-10-10 DIAGNOSIS — H9201 Otalgia, right ear: Secondary | ICD-10-CM | POA: Diagnosis not present

## 2018-10-10 DIAGNOSIS — R69 Illness, unspecified: Secondary | ICD-10-CM | POA: Diagnosis not present

## 2018-10-10 DIAGNOSIS — I10 Essential (primary) hypertension: Secondary | ICD-10-CM | POA: Diagnosis not present

## 2018-10-10 DIAGNOSIS — F331 Major depressive disorder, recurrent, moderate: Secondary | ICD-10-CM | POA: Diagnosis not present

## 2018-10-10 DIAGNOSIS — M79671 Pain in right foot: Secondary | ICD-10-CM | POA: Diagnosis not present

## 2018-10-10 MED ORDER — BUPROPION HCL ER (XL) 150 MG PO TB24: 150.0000 mg | ORAL_TABLET | Freq: Every day | ORAL | 2 refills | Status: DC

## 2018-10-10 NOTE — Progress Notes (Signed)
Date:  10/10/2018   Name:  Samantha Lang   DOB:  [birth year]   MRN:  [id number]   Chief Complaint: Otalgia (Left ear pain. and when blowing nose she gets blood out of left nostril. ) and Foot Pain (Right foot pain. More painful then last visit. Can't sleep. Took colchicine from husband and ibuprofen and it helped. )  Otalgia   There is pain in the left ear. This is a new problem. The current episode started in the past 7 days. The problem has been unchanged. There has been no fever. Pertinent negatives include no coughing, headaches or rash.  Foot Pain  This is a recurrent problem. The current episode started more than 1 year ago. The problem occurs intermittently. Associated symptoms include arthralgias and joint swelling. Pertinent negatives include no chest pain, chills, coughing, fatigue, fever, headaches, numbness, rash or weakness. Exacerbated by: she believes that she had gout. She has tried NSAIDs for the symptoms. The treatment provided moderate relief.  Depression         This is a chronic problem.  Associated symptoms include irritable, decreased interest and sad.  Associated symptoms include no fatigue, no headaches and no suicidal ideas.  Past treatments include SNRIs - Serotonin and norepinephrine reuptake inhibitors.  Compliance with treatment is good.  Risk factors include prior traumatic experience (recent move and downsize, stress with ill mother in law, elderly pets, etc).  Hypertension  This is a chronic problem. The problem is controlled. Pertinent negatives include no chest pain, headaches, palpitations or shortness of breath.    Review of Systems  Constitutional: Negative for chills, fatigue and fever.  HENT: Positive for ear pain.   Respiratory: Negative for cough, chest tightness, shortness of breath and wheezing.   Cardiovascular: Negative for chest pain and palpitations.  Musculoskeletal: Positive for arthralgias, gait problem and joint swelling.  Skin:  Positive for color change. Negative for rash.  Neurological: Negative for dizziness, tremors, weakness, numbness and headaches.  Hematological: Negative for adenopathy.  Psychiatric/Behavioral: Positive for depression and dysphoric mood. Negative for sleep disturbance and suicidal ideas. The patient is not nervous/anxious.     Patient Active Problem List   Diagnosis Date Noted  . Ganglion cyst of left foot 08/29/2018  . Xerosis of skin 08/29/2018  . Colon cancer screening   . Trochanteric bursitis of left hip 12/07/2016  . History of endometrial cancer 08/03/2016  . Edema leg 04/13/2016  . DM type 2 without retinopathy ([place]) 03/14/2016  . Shoulder pain, left 02/12/2016  . Acid reflux 10/02/2015  . 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 Hyperlipidemia associated with type 2 diabetes mellitus ([place]) 04/08/2015  . Aortic heart valve narrowing 03/26/2015  . Carotid artery narrowing 03/26/2015  . Bilateral carotid artery stenosis 03/26/2015  . Polyarthralgia 09/27/2011  . Anxiety   . Environmental and seasonal allergies   . Postmenopausal atrophic vaginitis   . Depression   . 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 Essential hypertension     Allergies  Allergen Reactions  . Lipitor [Atorvastatin Calcium]     Legs ache  With higher dose  . Cephalexin Rash  . Clarithromycin Rash    Past Surgical History:  Procedure Laterality Date  . ABDOMINAL HYSTERECTOMY  07/2016  . BREAST BIOPSY Right 03/21/08   right, benign   . COLONOSCOPY WITH PROPOFOL N/A 09/22/2017   Procedure: COLONOSCOPY WITH PROPOFOL;  Surgeon: Lucilla Lame, MD;  Location: [facility];  Service: Gastroenterology;  Laterality: N/A;  diabetic  . EYE SURGERY  Oct 2009   for ptosis,  Dr. Rosaria Ferries, eyelid lift  . POLYPECTOMY  09/22/2017   Procedure: POLYPECTOMY;  Surgeon: Lucilla Lame, MD;  Location: [facility];  Service:  Gastroenterology;;  . TUBAL LIGATION      Social History   Tobacco Use  . Smoking status: Former Smoker    Packs/day: 0.25    Years: 20.00    Pack years: 5.00    Types: Cigarettes    Last attempt to quit: 09/25/1988    Years since quitting: 30.0  . Smokeless tobacco: Never Used  Substance Use Topics  . Alcohol use: Yes    Alcohol/week: 6.0 standard drinks    Types: 2 Shots of liquor, 2 Cans of beer, 2 Glasses of wine per week  . Drug use: No     Medication list has been reviewed and updated.  Current Meds  Medication Sig  . amLODipine (NORVASC) 5 MG tablet 5 mg 2 (two) times daily.   Marland Kitchen aspirin 81 MG tablet Take 81 mg by mouth daily.  Marland Kitchen atorvastatin (LIPITOR) 10 MG tablet Take 1 tablet by mouth daily.  . carvedilol (COREG) 3.125 MG tablet TAKE 1 TABLET(3.125 MG) BY MOUTH TWICE DAILY WITH MEALS  . colchicine 0.6 MG tablet Take 0.6 mg by mouth daily.  . diazepam (VALIUM) 5 MG tablet Take 1 tablet (5 mg total) by mouth at bedtime as needed.  Marland Kitchen JANUVIA 100 MG tablet TAKE 1 TABLET BY MOUTH EVERY DAY  . ketoconazole (NIZORAL) 2 % cream Apply 1 application topically daily.  . Misc Natural Products (FIBER 7) POWD Take by mouth.  . pantoprazole (PROTONIX) 40 MG tablet TAKE 1 TABLET BY MOUTH DAILY  . telmisartan-hydrochlorothiazide (MICARDIS HCT) 80-25 MG tablet Take 1 tablet by mouth daily.  Marland Kitchen venlafaxine XR (EFFEXOR-XR) 150 MG 24 hr capsule Take 1 capsule (150 mg total) by mouth daily.    PHQ 2/9 Scores 10/10/2018 08/29/2018 03/01/2018 11/13/2017  PHQ - 2 Score 6 1 1 1   PHQ- 9 Score 16 1 1  -    Physical Exam  Constitutional: She is oriented to person, place, and time. She appears well-developed. She is irritable. No distress.  HENT:  Head: Normocephalic and atraumatic.  Right Ear: Tympanic membrane and ear canal normal.  Left Ear: Tympanic membrane and ear canal normal.  Nose: Right sinus exhibits no maxillary sinus tenderness and no frontal sinus tenderness. Left sinus exhibits  no maxillary sinus tenderness and no frontal sinus tenderness.  Neck: Normal range of motion. Neck supple.  Cardiovascular: Normal rate, regular rhythm and normal heart sounds.  Pulmonary/Chest: Effort normal and breath sounds normal. No respiratory distress.  Musculoskeletal: Normal range of motion.       Feet:  Neurological: She is alert and oriented to person, place, and time.  Skin: Skin is warm and dry. No rash noted.  Psychiatric: Her behavior is normal. Thought content normal. She exhibits a depressed mood. She expresses no suicidal ideation. She expresses no suicidal plans.  Nursing note and vitals reviewed.   BP (!) 142/84 (BP Location: Right Arm, Patient Position: Sitting, Cuff Size: Normal)   Pulse 66   Ht 5\' 2"  (1.575 m)   Wt 175 lb (79.4 kg)   SpO2 100%   BMI 32.01 kg/m   Assessment and Plan: 1. Foot pain, right Check labs and Xray - Uric acid - DG Foot Complete Right; Future  2. Essential hypertension controlled  3. Depression, major, recurrent, moderate ([place]) Add wellbutrin; continue effexor followup in 6 weeks - buPROPion (WELLBUTRIN XL) 150 MG 24 hr tablet; Take 1 tablet (150 mg total) by mouth daily.  Dispense: 30 tablet; Refill: 2  4. Otalgia of right ear Resolved, no evidence of infection   Partially dictated using Editor, commissioning. Any errors are unintentional.  Halina Maidens, MD [facility]  10/10/2018

## 2018-10-11 ENCOUNTER — Ambulatory Visit
Admission: RE | Admit: 2018-10-11 | Discharge: 2018-10-11 | Disposition: A | Discharge status: Home / Self Care | Payer: Medicare HMO | Source: Ambulatory Visit | Attending: Internal Medicine | Admitting: Internal Medicine

## 2018-10-11 ENCOUNTER — Encounter: Payer: Self-pay | Admitting: Internal Medicine

## 2018-10-11 DIAGNOSIS — M79671 Pain in right foot: Secondary | ICD-10-CM | POA: Insufficient documentation

## 2018-10-11 NOTE — Telephone Encounter (Signed)
Patient my chart message about medication. Please advise.

## 2018-10-12 DIAGNOSIS — M79671 Pain in right foot: Secondary | ICD-10-CM | POA: Diagnosis not present

## 2018-10-13 LAB — URIC ACID: URIC ACID: 7 mg/dL (ref 2.5–7.1)

## 2018-10-16 ENCOUNTER — Other Ambulatory Visit: Payer: Self-pay | Admitting: Internal Medicine

## 2018-10-18 DIAGNOSIS — E119 Type 2 diabetes mellitus without complications: Secondary | ICD-10-CM | POA: Diagnosis not present

## 2018-10-18 LAB — HM DIABETES EYE EXAM

## 2018-10-19 ENCOUNTER — Encounter: Payer: Self-pay | Admitting: Internal Medicine

## 2018-10-19 ENCOUNTER — Other Ambulatory Visit: Payer: Self-pay | Admitting: Internal Medicine

## 2018-10-19 DIAGNOSIS — M10079 Idiopathic gout, unspecified ankle and foot: Secondary | ICD-10-CM

## 2018-10-19 MED ORDER — ALLOPURINOL 100 MG PO TABS: 100.0000 mg | ORAL_TABLET | Freq: Every day | ORAL | 6 refills | Status: DC

## 2018-10-19 NOTE — Progress Notes (Signed)
Patient wants allopurinol sent in to Pharmacy- [facility] in [facility]

## 2018-10-30 ENCOUNTER — Other Ambulatory Visit: Payer: Self-pay

## 2018-10-30 MED ORDER — VENLAFAXINE HCL ER 150 MG PO CP24: 150.0000 mg | ORAL_CAPSULE | Freq: Every day | ORAL | 3 refills | Status: DC

## 2018-10-31 ENCOUNTER — Ambulatory Visit: Payer: Medicare HMO | Admitting: Internal Medicine

## 2018-11-03 ENCOUNTER — Other Ambulatory Visit: Payer: Self-pay | Admitting: Internal Medicine

## 2018-11-03 DIAGNOSIS — F331 Major depressive disorder, recurrent, moderate: Secondary | ICD-10-CM

## 2018-11-12 ENCOUNTER — Ambulatory Visit (INDEPENDENT_AMBULATORY_CARE_PROVIDER_SITE_OTHER): Payer: Medicare HMO

## 2018-11-12 DIAGNOSIS — Z23 Encounter for immunization: Secondary | ICD-10-CM

## 2018-11-14 DIAGNOSIS — M19072 Primary osteoarthritis, left ankle and foot: Secondary | ICD-10-CM | POA: Diagnosis not present

## 2018-11-14 DIAGNOSIS — M19071 Primary osteoarthritis, right ankle and foot: Secondary | ICD-10-CM | POA: Diagnosis not present

## 2018-11-19 ENCOUNTER — Ambulatory Visit: Payer: Self-pay

## 2018-11-26 ENCOUNTER — Ambulatory Visit: Payer: Medicare HMO | Admitting: Internal Medicine

## 2018-11-29 DIAGNOSIS — N952 Postmenopausal atrophic vaginitis: Secondary | ICD-10-CM | POA: Diagnosis not present

## 2018-11-29 DIAGNOSIS — N941 Unspecified dyspareunia: Secondary | ICD-10-CM | POA: Diagnosis not present

## 2018-12-04 ENCOUNTER — Telehealth: Payer: Self-pay | Admitting: Internal Medicine

## 2018-12-04 NOTE — Telephone Encounter (Signed)
Tried to call Pt.  No answer.  Left message to call Lattie Haw at [phone].lec

## 2018-12-05 DIAGNOSIS — L299 Pruritus, unspecified: Secondary | ICD-10-CM | POA: Diagnosis not present

## 2018-12-05 DIAGNOSIS — L719 Rosacea, unspecified: Secondary | ICD-10-CM | POA: Diagnosis not present

## 2018-12-05 DIAGNOSIS — L219 Seborrheic dermatitis, unspecified: Secondary | ICD-10-CM | POA: Diagnosis not present

## 2018-12-07 ENCOUNTER — Ambulatory Visit: Payer: Medicare HMO | Admitting: Internal Medicine

## 2018-12-11 NOTE — Telephone Encounter (Signed)
Called patient and scheduled her AWV-s for 01/09/19 at 11:20 am. Last AWV-11/13/17.lec

## 2018-12-13 ENCOUNTER — Telehealth: Payer: Self-pay | Admitting: Internal Medicine

## 2018-12-13 NOTE — Telephone Encounter (Signed)
Called patient and set up AWV-S for 01/09/19 at 11:20 am. lec

## 2018-12-25 ENCOUNTER — Encounter: Payer: Self-pay | Admitting: Internal Medicine

## 2018-12-25 ENCOUNTER — Ambulatory Visit (INDEPENDENT_AMBULATORY_CARE_PROVIDER_SITE_OTHER): Payer: Medicare HMO | Admitting: Internal Medicine

## 2018-12-25 VITALS — BP 144/76 | HR 71 | Ht 62.0 in | Wt 173.0 lb

## 2018-12-25 DIAGNOSIS — R42 Dizziness and giddiness: Secondary | ICD-10-CM | POA: Diagnosis not present

## 2018-12-25 DIAGNOSIS — I10 Essential (primary) hypertension: Secondary | ICD-10-CM | POA: Diagnosis not present

## 2018-12-25 MED ORDER — DIAZEPAM 5 MG PO TABS: 2.5000 mg | ORAL_TABLET | Freq: Two times a day (BID) | ORAL | 0 refills | Status: DC | PRN

## 2018-12-25 NOTE — Progress Notes (Signed)
Date:  12/25/2018   Name:  Samantha Lang   DOB:  Nov 02, 1941   MRN:  [id number]   Chief Complaint: Dizziness (Started X 1 week. on and off but this morning woke up very dizzy. Has had vertigo in the past. Feels like cannot walk straight. No SOb or chest pain. There is tingling in the palms of both hands. More in left. )  Dizziness  This is a recurrent problem. The current episode started today. The problem has been gradually improving. Pertinent negatives include no arthralgias, chest pain, chills, coughing, fatigue, fever, headaches, nausea, vertigo, vomiting or weakness. The symptoms are aggravated by standing and twisting. She has tried rest for the symptoms. The treatment provided mild relief.  Hypertension  This is a chronic problem. Pertinent negatives include no chest pain, headaches, palpitations or shortness of breath. Past treatments include angiotensin blockers, diuretics, calcium channel blockers and beta blockers. There are no compliance problems.     Review of Systems  Constitutional: Negative for chills, fatigue and fever.  Respiratory: Negative for cough, chest tightness, shortness of breath and wheezing.   Cardiovascular: Negative for chest pain, palpitations and leg swelling.  Gastrointestinal: Negative for nausea and vomiting.  Musculoskeletal: Negative for arthralgias.  Neurological: Positive for dizziness and light-headedness. Negative for vertigo, syncope, weakness and headaches.    Patient Active Problem List   Diagnosis Date Noted  . Foot pain, right 10/10/2018  . Ganglion cyst of left foot 08/29/2018  . Xerosis of skin 08/29/2018  . Colon cancer screening   . Trochanteric bursitis of left hip 12/07/2016  . History of endometrial cancer 08/03/2016  . Edema leg 04/13/2016  . DM type 2 without retinopathy ([place]) 03/14/2016  . Shoulder pain, left 02/12/2016  . Acid reflux 10/02/2015  . 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 TI (tricuspid incompetence)  04/09/2015  . Hyperlipidemia associated with type 2 diabetes mellitus ([place]) 04/08/2015  . Aortic heart valve narrowing 03/26/2015  . Carotid artery narrowing 03/26/2015  . Bilateral carotid artery stenosis 03/26/2015  . Polyarthralgia 09/27/2011  . Anxiety   . Environmental and seasonal allergies   . Postmenopausal atrophic vaginitis   . Depression, major, recurrent, moderate ([place])   . 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 Essential hypertension     Allergies  Allergen Reactions  . Lipitor [Atorvastatin Calcium]     Legs ache  With higher dose  . Cephalexin Rash  . Clarithromycin Rash    Past Surgical History:  Procedure Laterality Date  . ABDOMINAL HYSTERECTOMY  07/2016  . BREAST BIOPSY Right 03/21/08   right, benign   . COLONOSCOPY WITH PROPOFOL N/A 09/22/2017   Procedure: COLONOSCOPY WITH PROPOFOL;  Surgeon: Lucilla Lame, MD;  Location: [facility];  Service: Gastroenterology;  Laterality: N/A;  diabetic  . EYE SURGERY  Oct 2009   for ptosis,  Dr. Rosaria Ferries, eyelid lift  . POLYPECTOMY  09/22/2017   Procedure: POLYPECTOMY;  Surgeon: Lucilla Lame, MD;  Location: [facility];  Service: Gastroenterology;;  . TUBAL LIGATION      Social History   Tobacco Use  . Smoking status: Former Smoker    Packs/day: 0.25    Years: 20.00    Pack years: 5.00    Types: Cigarettes    Last attempt to quit: 09/25/1988    Years since quitting: 30.2  . Smokeless tobacco: Never Used  Substance Use Topics  . Alcohol use: Yes    Alcohol/week: 6.0 standard drinks  Types: 2 Shots of liquor, 2 Cans of beer, 2 Glasses of wine per week  . Drug use: No     Medication list has been reviewed and updated.  Current Meds  Medication Sig  . allopurinol (ZYLOPRIM) 100 MG tablet Take 1 tablet (100 mg total) by mouth daily.  Marland Kitchen amLODipine (NORVASC) 5 MG tablet 5 mg 2 (two) times daily.   Marland Kitchen aspirin 81 MG tablet Take 81 mg by mouth daily.  Marland Kitchen  atorvastatin (LIPITOR) 10 MG tablet Take 1 tablet by mouth daily.  Marland Kitchen buPROPion (WELLBUTRIN XL) 150 MG 24 hr tablet TAKE 1 TABLET BY MOUTH EVERY DAY  . carvedilol (COREG) 3.125 MG tablet TAKE 1 TABLET(3.125 MG) BY MOUTH TWICE DAILY WITH MEALS  . colchicine 0.6 MG tablet Take 0.6 mg by mouth daily.  . diazepam (VALIUM) 5 MG tablet Take 1 tablet (5 mg total) by mouth at bedtime as needed.  Marland Kitchen JANUVIA 100 MG tablet TAKE 1 TABLET BY MOUTH EVERY DAY  . ketoconazole (NIZORAL) 2 % cream Apply 1 application topically daily.  . Misc Natural Products (FIBER 7) POWD Take by mouth.  . pantoprazole (PROTONIX) 40 MG tablet TAKE 1 TABLET BY MOUTH DAILY  . telmisartan-hydrochlorothiazide (MICARDIS HCT) 80-25 MG tablet Take 1 tablet by mouth daily.  Marland Kitchen venlafaxine XR (EFFEXOR-XR) 150 MG 24 hr capsule Take 1 capsule (150 mg total) by mouth daily.    PHQ 2/9 Scores 10/10/2018 08/29/2018 03/01/2018 11/13/2017  PHQ - 2 Score 6 1 1 1   PHQ- 9 Score 16 1 1  -    Physical Exam Vitals signs and nursing note reviewed.  Constitutional:      General: She is not in acute distress.    Appearance: She is well-developed.  HENT:     Head: Normocephalic and atraumatic.     Right Ear: Tympanic membrane and ear canal normal.     Left Ear: Tympanic membrane and ear canal normal.     Nose:     Right Sinus: No maxillary sinus tenderness or frontal sinus tenderness.     Left Sinus: No maxillary sinus tenderness or frontal sinus tenderness.     Mouth/Throat:     Pharynx: No posterior oropharyngeal erythema.  Eyes:     Extraocular Movements: Extraocular movements intact.     Conjunctiva/sclera: Conjunctivae normal.  Neck:     Musculoskeletal: Normal range of motion and neck supple.  Cardiovascular:     Rate and Rhythm: Normal rate and regular rhythm.  Pulmonary:     Effort: Pulmonary effort is normal. No respiratory distress.     Breath sounds: Normal breath sounds.  Musculoskeletal: Normal range of motion.  Skin:     General: Skin is warm and dry.     Findings: No rash.  Neurological:     Mental Status: She is alert and oriented to person, place, and time.  Psychiatric:        Attention and Perception: Attention normal.        Mood and Affect: Mood normal.        Behavior: Behavior normal.        Thought Content: Thought content normal.     BP (!) 144/76 (BP Location: Right Arm, Patient Position: Supine, Cuff Size: Normal)   Pulse 71   Ht 5\' 2"  (1.575 m)   Wt 173 lb (78.5 kg)   SpO2 97%   BMI 31.64 kg/m   Assessment and Plan: 1. Vertigo May need to see ENT if sx recur Increase  fluid intake - diazepam (VALIUM) 5 MG tablet; Take 0.5 tablets (2.5 mg total) by mouth every 12 (twelve) hours as needed (vertigo).  Dispense: 30 tablet; Refill: 0  2. Essential hypertension Fair control - elevated today due to dizziness Continue current medication Follow up in one month   Partially dictated using [place]. Any errors are unintentional.  Halina Maidens, MD [facility]  12/25/2018

## 2019-01-09 ENCOUNTER — Ambulatory Visit (INDEPENDENT_AMBULATORY_CARE_PROVIDER_SITE_OTHER): Payer: Medicare HMO

## 2019-01-09 VITALS — BP 142/72 | HR 66 | Temp 97.5°F | Resp 16 | Ht 62.0 in | Wt 176.4 lb

## 2019-01-09 DIAGNOSIS — Z1231 Encounter for screening mammogram for malignant neoplasm of breast: Secondary | ICD-10-CM | POA: Diagnosis not present

## 2019-01-09 DIAGNOSIS — Z0000 Encounter for general adult medical examination without abnormal findings: Secondary | ICD-10-CM | POA: Diagnosis not present

## 2019-01-09 NOTE — Progress Notes (Signed)
Subjective:   Samantha Lang is a 78 y.o. female who presents for Medicare Annual (Subsequent) preventive examination.  Review of Systems:   Cardiac Risk Factors include: advanced age (>62men, >2 women);diabetes mellitus;dyslipidemia;hypertension;obesity (BMI >30kg/m2)     Objective:     Vitals: BP (!) 142/72 (BP Location: Left Arm, Patient Position: Sitting, Cuff Size: Normal)   Pulse 66   Temp (!) 97.5 F (36.4 C) (Oral)   Resp 16   Ht 5\' 2"  (1.575 m)   Wt 176 lb 6.4 oz (80 kg)   SpO2 97%   BMI 32.26 kg/m   Body mass index is 32.26 kg/m.  Advanced Directives 01/09/2019 11/13/2017 09/22/2017 07/26/2017 08/03/2016 03/14/2016 02/12/2016  Does Patient Have a Medical Advance Directive? No No No No No No No  Would patient like information on creating a medical advance directive? Yes (MAU/Ambulatory/Procedural Areas - Information given) Yes (MAU/Ambulatory/Procedural Areas - Information given) Yes (Inpatient - patient requests chaplain consult to create a medical advance directive) No - Patient declined No - patient declined information No - patient declined information No - patient declined information    Tobacco Social History   Tobacco Use  Smoking Status Former Smoker  . Packs/day: 0.25  . Years: 20.00  . Pack years: 5.00  . Types: Cigarettes  . Last attempt to quit: 09/25/1988  . Years since quitting: 30.3  Smokeless Tobacco Never Used     Counseling given: Not Answered   Clinical Intake:  Pre-visit preparation completed: Yes  Pain : No/denies pain     Nutritional Risks: None Diabetes: Yes CBG done?: No Did pt. bring in CBG monitor from home?: No   Nutrition Risk Assessment:  Has the patient had any N/V/D within the last 2 months?  No  Does the patient have any non-healing wounds?  No  Has the patient had any unintentional weight loss or weight gain?  No   Diabetes:  Is the patient diabetic?  Yes  If diabetic, was a CBG obtained today?  No  Did the  patient bring in their glucometer from home?  No  How often do you monitor your CBG's? Pt does not actively check her blood sugar.   Financial Strains and Diabetes Management:  Are you having any financial strains with the device, your supplies or your medication? No .  Does the patient want to be seen by Chronic Care Management for management of their diabetes?  No  Would the patient like to be referred to a Nutritionist or for Diabetic Management?  No   Diabetic Exams:  Diabetic Eye Exam: Completed 10/18/18 negative retinopathy.   Diabetic Foot Exam: Completed 11/01/17. Pt has been advised about the importance in completing this exam. 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 Allergy   . Anxiety   . Aortic valve stenosis, mild   . Depression    mild  . Diabetes mellitus age 40  . GERD (gastroesophageal reflux disease)   . Heart murmur   . Hemorrhoids   . Hyperlipidemia   . Hypertension 2003  . Incontinence    Female stress  . Mitral incompetence   .  Personal history of chemotherapy   . Personal history of radiation therapy   . Postmenopausal atrophic vaginitis   . Uterine cancer [facility])    treated   Past Surgical History:  Procedure Laterality Date  . ABDOMINAL HYSTERECTOMY  07/2016  . BREAST BIOPSY Right 03/21/08   right, benign   . COLONOSCOPY WITH PROPOFOL N/A 09/22/2017   Procedure: COLONOSCOPY WITH PROPOFOL;  Surgeon: Lucilla Lame, MD;  Location: [place];  Service: Gastroenterology;  Laterality: N/A;  diabetic  . EYE SURGERY  Oct 2009   for ptosis,  Dr. Rosaria Ferries, eyelid lift  . POLYPECTOMY  09/22/2017    Procedure: POLYPECTOMY;  Surgeon: Lucilla Lame, MD;  Location: [place];  Service: Gastroenterology;;  . TUBAL LIGATION     Family History  Problem Relation Age of Onset  . Breast cancer Paternal Aunt   . Breast cancer Paternal Grandmother   . Cancer Father 29       lung  . Stroke Mother   . Hypertension Mother   . Cancer Brother        prostate  . Depression Brother   . Ovarian cancer Neg Hx   . Colon cancer Neg Hx   . Diabetes Neg Hx    Social History   Socioeconomic History  . Marital status: Married    Spouse name: Not on file  . Number of children: 2  . Years of education: Not on file  . Highest education level: Master's degree (e.g., MA, MS, MEng, MEd, MSW, MBA)  Occupational History  . Occupation: Retired    Comment: Engineer, mining  Social Needs  . Financial resource strain: Not hard at all  . Food insecurity:    Worry: Never true    Inability: Never true  . Transportation needs:    Medical: No    Non-medical: No  Tobacco Use  . Smoking status: Former Smoker    Packs/day: 0.25    Years: 20.00    Pack years: 5.00    Types: Cigarettes    Last attempt to quit: 09/25/1988    Years since quitting: 30.3  . Smokeless tobacco: Never Used  Substance and Sexual Activity  . Alcohol use: Yes    Alcohol/week: 6.0 standard drinks    Types: 2 Glasses of wine, 2 Cans of beer, 2 Shots of liquor per week  . Drug use: No  . Sexual activity: Yes    Birth control/protection: Post-menopausal  Lifestyle  . Physical activity:    Days per week: 0 days    Minutes per session: 0 min  . Stress: To some extent  Relationships  . Social connections:    Talks on phone: More than three times a week    Gets together: Twice a week    Attends religious service: Never    Active member of club or organization: No    Attends meetings of clubs or organizations: Never    Relationship status: Married  Other Topics Concern  . Not on file  Social History Narrative  . Not  on file    Outpatient Encounter Medications as of 01/09/2019  Medication Sig  . allopurinol (ZYLOPRIM) 100 MG tablet Take 1 tablet (100 mg total) by mouth daily.  Marland Kitchen amLODipine (NORVASC) 5 MG tablet 5 mg 2 (two) times daily.   Marland Kitchen aspirin 81 MG tablet Take 81 mg by mouth daily.  Marland Kitchen atorvastatin (LIPITOR) 10 MG tablet Take 1 tablet by mouth daily.  . carvedilol (COREG) 3.125 MG tablet TAKE  1 TABLET(3.125 MG) BY MOUTH TWICE DAILY WITH MEALS  . colchicine 0.6 MG tablet Take 0.6 mg by mouth daily.  . diazepam (VALIUM) 5 MG tablet Take 0.5 tablets (2.5 mg total) by mouth every 12 (twelve) hours as needed (vertigo).  Marland Kitchen JANUVIA 100 MG tablet TAKE 1 TABLET BY MOUTH EVERY DAY  . Misc Natural Products (FIBER 7) POWD Take by mouth. benefiber  . pantoprazole (PROTONIX) 40 MG tablet TAKE 1 TABLET BY MOUTH DAILY  . telmisartan-hydrochlorothiazide (MICARDIS HCT) 80-25 MG tablet Take 1 tablet by mouth daily.  Marland Kitchen venlafaxine XR (EFFEXOR-XR) 150 MG 24 hr capsule Take 1 capsule (150 mg total) by mouth daily.  . metroNIDAZOLE (METROCREAM) 0.75 % cream   . [DISCONTINUED] buPROPion (WELLBUTRIN XL) 150 MG 24 hr tablet TAKE 1 TABLET BY MOUTH EVERY DAY (Patient not taking: Reported on 01/09/2019)  . [DISCONTINUED] ketoconazole (NIZORAL) 2 % cream Apply 1 application topically daily.   No facility-administered encounter medications on file as of 01/09/2019.     Activities of Daily Living In your present state of health, do you have any difficulty performing the following activities: 01/09/2019  Hearing? N  Comment declines hearing aids  Vision? N  Comment reading glasses  Difficulty concentrating or making decisions? N  Walking or climbing stairs? N  Dressing or bathing? N  Doing errands, shopping? N  Preparing Food and eating ? N  Using the Toilet? N  In the past six months, have you accidently leaked urine? Y  Comment wears pads for protection  Do you have problems with loss of bowel control? N  Managing your  Medications? N  Managing your Finances? N  Housekeeping or managing your Housekeeping? N  Some recent data might be hidden    Patient Care Team: Glean Hess, MD as PCP - General (Internal Medicine) [place] (Ophthalmology) Monia Pouch Consuello Masse, MD as Referring Physician (Obstetrics and Gynecology) Corey Skains, MD as Consulting Physician (Cardiology)    Assessment:   This is a routine wellness examination for Samantha Lang.  Exercise Activities and Dietary recommendations Current Exercise Habits: The patient does not participate in regular exercise at present, Exercise limited by: None identified  Goals    . Exercise 150 min/wk Moderate Activity     Recommend to exercise at least 3-4 times per week for at least 30-40 minutes       Fall Risk Fall Risk  01/09/2019 11/13/2017 07/13/2017 07/11/2016 10/02/2015  Falls in the past year? 1 No No No No  Number falls in past yr: 1 - - - -  Injury with Fall? 0 - - - -   FALL RISK PREVENTION PERTAINING TO THE HOME:  Any stairs in or around the home WITH handrails? No  Home free of loose throw rugs in walkways, pet beds, electrical cords, etc? Yes  Adequate lighting in your home to reduce risk of falls? Yes   ASSISTIVE DEVICES UTILIZED TO PREVENT FALLS:  Life alert? No  Use of a cane, walker or w/c? No  Grab bars in the bathroom? No  Shower chair or bench in shower? No  Elevated toilet seat or a handicapped toilet? No   DME ORDERS:  DME order needed?  No   TIMED UP AND GO:  Was the test performed? Yes .  Length of time to ambulate 10 feet: 6 sec.   GAIT:  Appearance of gait: Gait stead-fast and without the use of an assistive device.  Education: Fall risk prevention has been discussed.  Intervention(s) required? No   Depression Screen PHQ 2/9 Scores 01/09/2019 10/10/2018 08/29/2018 03/01/2018  PHQ - 2 Score 2 6 1 1   PHQ- 9 Score 4 16 1 1      Cognitive Function pt declined 6CIT for 2020 AWV     6CIT Screen  11/13/2017  What Year? 0 points  What month? 0 points  What time? 3 points  Count back from 20 0 points  Months in reverse 0 points  Repeat phrase 4 points  Total Score 7    Immunization History  Administered Date(s) Administered  . Influenza, High Dose Seasonal PF 11/12/2018  . Influenza,inj,Quad PF,6+ Mos 10/02/2015, 11/07/2016, 11/01/2017  . Pneumococcal Conjugate-13 02/05/2014  . Pneumococcal Polysaccharide-23 06/09/2008  . Tdap 06/09/2010    Qualifies for Shingles Vaccine? Yes . Due for Shingrix. Education has been provided regarding the importance of this vaccine. Pt has been advised to call insurance company to determine out of pocket expense. Advised may also receive vaccine at local pharmacy or Health Dept. Verbalized acceptance and understanding.  Tdap: Up to date  Flu Vaccine: Up to date  Pneumococcal Vaccine: Up to date  Screening Tests Health Maintenance  Topic Date Due  . FOOT EXAM  11/01/2018  . HEMOGLOBIN A1C  03/04/2019  . OPHTHALMOLOGY EXAM  10/19/2019  . TETANUS/TDAP  06/09/2020  . INFLUENZA VACCINE  Completed  . PNA vac Low Risk Adult  Completed  . DEXA SCAN  Addressed   Cancer Screenings:  Colorectal Screening: Completed 09/22/17. No longer required.   Mammogram: Completed 02/27/18. Repeat every year;Ordered today. Pt provided with contact information and advised to call to schedule appt.   Bone Density: Completed 02/27/18. Results reflect NORMAL,  Repeat every 2 years.   Lung Cancer Screening: (Low Dose CT Chest recommended if Age 25-80 years, 30 pack-year currently smoking OR have quit w/in 15years.) does not qualify.     Additional Screening:  Hepatitis C Screening: no longer required  Vision Screening: Recommended annual ophthalmology exams for early detection of glaucoma and other disorders of the eye. Is the patient up to date with their annual eye exam?  Yes  Who is the provider or what is the name of the office in which the pt attends  annual eye exams? [place] Screening: Recommended annual dental exams for proper oral hygiene  Community Resource Referral:  CRR required this visit?  No      Plan:    I have personally reviewed and addressed the Medicare Annual Wellness questionnaire and have noted the following in the patient's chart:  A. Medical and social history B. Use of alcohol, tobacco or illicit drugs  C. Current medications and supplements D. Functional ability and status E.  Nutritional status F.  Physical activity G. Advance directives H. List of other physicians I.  Hospitalizations, surgeries, and ER visits in previous 12 months J.  [place] such as hearing and vision if needed, cognitive and depression L. Referrals and appointments   In addition, I have reviewed and discussed with patient certain preventive protocols, quality metrics, and best practice recommendations. A written personalized care plan for preventive services as well as general preventive health recommendations were provided to patient.   Signed,  Clemetine Marker, LPN Nurse Health Advisor   Nurse Notes: none.

## 2019-01-09 NOTE — Patient Instructions (Signed)
Samantha Lang , Thank you for taking time to come for your Medicare Wellness Visit. I appreciate your ongoing commitment to your health goals. Please review the following plan we discussed and let me know if I can assist you in the future.   Screening recommendations/referrals: Colonoscopy: done 09/22/17 Mammogram: done 02/27/18. Please call [phone] to schedule your mammogram.  Recommended yearly ophthalmology/optometry visit for glaucoma screening and checkup Recommended yearly dental visit for hygiene and checkup  Vaccinations: Influenza vaccine: done 11/12/18 Pneumococcal vaccine: done 02/05/15 Tdap vaccine: done 06/09/10 Shingles vaccine: Shingrix discussed. Please contact your pharmacy for coverage information.     Advanced directives: Advance directive discussed with you today. I have provided a copy for you to complete at home and have notarized. Once this is complete please bring a copy in to our office so we can scan it into your chart.  Conditions/risks identified: Recommend increasing physical activity.  Next appointment: Please follow up in one year for your Medicare Annual Wellness visit.     Preventive Care 77 Years and Older, Female Preventive care refers to lifestyle choices and visits with your health care provider that can promote health and wellness. What does preventive care include?  A yearly physical exam. This is also called an annual well check.  Dental exams once or twice a year.  Routine eye exams. Ask your health care provider how often you should have your eyes checked.  Personal lifestyle choices, including:  Daily care of your teeth and gums.  Regular physical activity.  Eating a healthy diet.  Avoiding tobacco and drug use.  Limiting alcohol use.  Practicing safe sex.  Taking low-dose aspirin every day.  Taking vitamin and mineral supplements as recommended by your health care provider. What happens during an annual well check? The  services and screenings done by your health care provider during your annual well check will depend on your age, overall health, lifestyle risk factors, and family history of disease. Counseling  Your health care provider may ask you questions about your:  Alcohol use.  Tobacco use.  Drug use.  Emotional well-being.  Home and relationship well-being.  Sexual activity.  Eating habits.  History of falls.  Memory and ability to understand (cognition).  Work and work Statistician.  Reproductive health. Screening  You may have the following tests or measurements:  Height, weight, and BMI.  Blood pressure.  Lipid and cholesterol levels. These may be checked every 5 years, or more frequently if you are over 42 years old.  Skin check.  Lung cancer screening. You may have this screening every year starting at age 10 if you have a 30-pack-year history of smoking and currently smoke or have quit within the past 15 years.  Fecal occult blood test (FOBT) of the stool. You may have this test every year starting at age 66.  Flexible sigmoidoscopy or colonoscopy. You may have a sigmoidoscopy every 5 years or a colonoscopy every 10 years starting at age 75.  Hepatitis C blood test.  Hepatitis B blood test.  Sexually transmitted disease (STD) testing.  Diabetes screening. This is done by checking your blood sugar (glucose) after you have not eaten for a while (fasting). You may have this done every 1-3 years.  Bone density scan. This is done to screen for osteoporosis. You may have this done starting at age 27.  Mammogram. This may be done every 1-2 years. Talk to your health care provider about how often you should have regular mammograms. Talk  with your health care provider about your test results, treatment options, and if necessary, the need for more tests. Vaccines  Your health care provider may recommend certain vaccines, such as:  Influenza vaccine. This is recommended  every year.  Tetanus, diphtheria, and acellular pertussis (Tdap, Td) vaccine. You may need a Td booster every 10 years.  Zoster vaccine. You may need this after age 11.  Pneumococcal 13-valent conjugate (PCV13) vaccine. One dose is recommended after age 34.  Pneumococcal polysaccharide (PPSV23) vaccine. One dose is recommended after age 83. Talk to your health care provider about which screenings and vaccines you need and how often you need them. This information is not intended to replace advice given to you by your health care provider. Make sure you discuss any questions you have with your health care provider. Document Released: 01/08/2016 Document Revised: 08/31/2016 Document Reviewed: 10/13/2015 Elsevier Interactive Patient Education  2017 [place] Prevention in the Home Falls can cause injuries. They can happen to people of all ages. There are many things you can do to make your home safe and to help prevent falls. What can I do on the outside of my home?  Regularly fix the edges of walkways and driveways and fix any cracks.  Remove anything that might make you trip as you walk through a door, such as a raised step or threshold.  Trim any bushes or trees on the path to your home.  Use bright outdoor lighting.  Clear any walking paths of anything that might make someone trip, such as rocks or tools.  Regularly check to see if handrails are loose or broken. Make sure that both sides of any steps have handrails.  Any raised decks and porches should have guardrails on the edges.  Have any leaves, snow, or ice cleared regularly.  Use sand or salt on walking paths during winter.  Clean up any spills in your garage right away. This includes oil or grease spills. What can I do in the bathroom?  Use night lights.  Install grab bars by the toilet and in the tub and shower. Do not use towel bars as grab bars.  Use non-skid mats or decals in the tub or shower.  If  you need to sit down in the shower, use a plastic, non-slip stool.  Keep the floor dry. Clean up any water that spills on the floor as soon as it happens.  Remove soap buildup in the tub or shower regularly.  Attach bath mats securely with double-sided non-slip rug tape.  Do not have throw rugs and other things on the floor that can make you trip. What can I do in the bedroom?  Use night lights.  Make sure that you have a light by your bed that is easy to reach.  Do not use any sheets or blankets that are too big for your bed. They should not hang down onto the floor.  Have a firm chair that has side arms. You can use this for support while you get dressed.  Do not have throw rugs and other things on the floor that can make you trip. What can I do in the kitchen?  Clean up any spills right away.  Avoid walking on wet floors.  Keep items that you use a lot in easy-to-reach places.  If you need to reach something above you, use a strong step stool that has a grab bar.  Keep electrical cords out of the way.  Do  not use floor polish or wax that makes floors slippery. If you must use wax, use non-skid floor wax.  Do not have throw rugs and other things on the floor that can make you trip. What can I do with my stairs?  Do not leave any items on the stairs.  Make sure that there are handrails on both sides of the stairs and use them. Fix handrails that are broken or loose. Make sure that handrails are as long as the stairways.  Check any carpeting to make sure that it is firmly attached to the stairs. Fix any carpet that is loose or worn.  Avoid having throw rugs at the top or bottom of the stairs. If you do have throw rugs, attach them to the floor with carpet tape.  Make sure that you have a light switch at the top of the stairs and the bottom of the stairs. If you do not have them, ask someone to add them for you. What else can I do to help prevent falls?  Wear shoes  that:  Do not have high heels.  Have rubber bottoms.  Are comfortable and fit you well.  Are closed at the toe. Do not wear sandals.  If you use a stepladder:  Make sure that it is fully opened. Do not climb a closed stepladder.  Make sure that both sides of the stepladder are locked into place.  Ask someone to hold it for you, if possible.  Clearly mark and make sure that you can see:  Any grab bars or handrails.  First and last steps.  Where the edge of each step is.  Use tools that help you move around (mobility aids) if they are needed. These include:  Canes.  Walkers.  Scooters.  Crutches.  Turn on the lights when you go into a dark area. Replace any light bulbs as soon as they burn out.  Set up your furniture so you have a clear path. Avoid moving your furniture around.  If any of your floors are uneven, fix them.  If there are any pets around you, be aware of where they are.  Review your medicines with your doctor. Some medicines can make you feel dizzy. This can increase your chance of falling. Ask your doctor what other things that you can do to help prevent falls. This information is not intended to replace advice given to you by your health care provider. Make sure you discuss any questions you have with your health care provider. Document Released: 10/08/2009 Document Revised: 05/19/2016 Document Reviewed: 01/16/2015 Elsevier Interactive Patient Education  2017 [employer].

## 2019-01-21 ENCOUNTER — Encounter: Payer: Self-pay | Admitting: Internal Medicine

## 2019-01-26 ENCOUNTER — Other Ambulatory Visit: Payer: Self-pay | Admitting: Internal Medicine

## 2019-01-28 ENCOUNTER — Ambulatory Visit (INDEPENDENT_AMBULATORY_CARE_PROVIDER_SITE_OTHER): Payer: Medicare HMO | Admitting: Internal Medicine

## 2019-01-28 ENCOUNTER — Other Ambulatory Visit: Payer: Self-pay

## 2019-01-28 ENCOUNTER — Encounter: Payer: Self-pay | Admitting: Internal Medicine

## 2019-01-28 VITALS — BP 124/80 | HR 87 | Ht 62.0 in | Wt 176.0 lb

## 2019-01-28 DIAGNOSIS — I10 Essential (primary) hypertension: Secondary | ICD-10-CM

## 2019-01-28 DIAGNOSIS — E119 Type 2 diabetes mellitus without complications: Secondary | ICD-10-CM | POA: Diagnosis not present

## 2019-01-28 DIAGNOSIS — Z8542 Personal history of malignant neoplasm of other parts of uterus: Secondary | ICD-10-CM

## 2019-01-28 DIAGNOSIS — M25511 Pain in right shoulder: Secondary | ICD-10-CM

## 2019-01-28 NOTE — Progress Notes (Signed)
Date:  01/28/2019   Name:  Samantha Lang   DOB:  [birth year]   MRN:  [id number]   Chief Complaint: Diabetes (1 month follow up. Foot Exam.); Hypertension; and memory issues  Diabetes  She presents for her follow-up diabetic visit. She has type 2 diabetes mellitus. Her disease course has been stable. Pertinent negatives for hypoglycemia include no headaches or tremors. Pertinent negatives for diabetes include no chest pain, no fatigue, no polydipsia and no polyuria. Current diabetic treatment includes oral agent (monotherapy). There is no compliance with monitoring of blood glucose.  Hypertension  Pertinent negatives include no chest pain, headaches, palpitations or shortness of breath.  Memory changes - has had several incidents - turned on the stove, couldn't remember a movie they had seen.  Review of Systems  Constitutional: Negative for appetite change, fatigue, fever and unexpected weight change.  HENT: Negative for tinnitus and trouble swallowing.   Eyes: Negative for visual disturbance.  Respiratory: Negative for cough, chest tightness and shortness of breath.   Cardiovascular: Negative for chest pain, palpitations and leg swelling.  Gastrointestinal: Negative for abdominal pain.  Endocrine: Negative for polydipsia and polyuria.  Genitourinary: Negative for dysuria and hematuria.  Musculoskeletal: Positive for arthralgias (right shoulder discomfort).  Allergic/Immunologic: Negative for environmental allergies.  Neurological: Negative for tremors, numbness and headaches.  Psychiatric/Behavioral: Negative for dysphoric mood.    Patient Active Problem List   Diagnosis Date Noted  . Foot pain, right 10/10/2018  . Ganglion cyst of left foot 08/29/2018  . Xerosis of skin 08/29/2018  . Palpitations 08/08/2018  . Colon cancer screening   . Trochanteric bursitis of left hip 12/07/2016  . History of endometrial cancer 08/03/2016  . Edema leg 04/13/2016  . DM type 2 without  retinopathy ([place]) 03/14/2016  . Shoulder pain, left 02/12/2016  . Acid reflux 10/02/2015  . 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 Hyperlipidemia associated with type 2 diabetes mellitus ([place]) 04/08/2015  . Aortic heart valve narrowing 03/26/2015  . Carotid artery narrowing 03/26/2015  . Bilateral carotid artery stenosis 03/26/2015  . Polyarthralgia 09/27/2011  . Anxiety   . Environmental and seasonal allergies   . Postmenopausal atrophic vaginitis   . Depression, major, recurrent, moderate ([place])   . 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 Essential hypertension     Allergies  Allergen Reactions  . Lipitor [Atorvastatin Calcium]     Legs ache  With higher dose  . Cephalexin Rash  . Clarithromycin Rash    Past Surgical History:  Procedure Laterality Date  . ABDOMINAL HYSTERECTOMY  07/2016  . BREAST BIOPSY Right 03/21/08   right, benign   . COLONOSCOPY WITH PROPOFOL N/A 09/22/2017   Procedure: COLONOSCOPY WITH PROPOFOL;  Surgeon: Lucilla Lame, MD;  Location: [facility];  Service: Gastroenterology;  Laterality: N/A;  diabetic  . EYE SURGERY  Oct 2009   for ptosis,  Dr. Rosaria Ferries, eyelid lift  . POLYPECTOMY  09/22/2017   Procedure: POLYPECTOMY;  Surgeon: Lucilla Lame, MD;  Location: [facility];  Service: Gastroenterology;;  . TUBAL LIGATION      Social History   Tobacco Use  . Smoking status: Former Smoker    Packs/day: 0.25    Years: 20.00    Pack years: 5.00    Types: Cigarettes    Last attempt to quit: 09/25/1988    Years since quitting: 30.3  . Smokeless tobacco: Never Used  Substance Use Topics  .  Alcohol use: Yes    Alcohol/week: 6.0 standard drinks    Types: 2 Glasses of wine, 2 Cans of beer, 2 Shots of liquor per week  . Drug use: No     Medication list has been reviewed and updated.  Current Meds  Medication Sig  . allopurinol (ZYLOPRIM) 100 MG tablet Take 1  tablet (100 mg total) by mouth daily.  Marland Kitchen amLODipine (NORVASC) 5 MG tablet 5 mg 2 (two) times daily.   Marland Kitchen aspirin 81 MG tablet Take 81 mg by mouth daily.  Marland Kitchen atorvastatin (LIPITOR) 10 MG tablet Take 1 tablet by mouth daily.  . carvedilol (COREG) 3.125 MG tablet TAKE 1 TABLET(3.125 MG) BY MOUTH TWICE DAILY WITH MEALS  . colchicine 0.6 MG tablet Take 0.6 mg by mouth daily.  . diazepam (VALIUM) 5 MG tablet Take 0.5 tablets (2.5 mg total) by mouth every 12 (twelve) hours as needed (vertigo).  Marland Kitchen JANUVIA 100 MG tablet TAKE 1 TABLET BY MOUTH EVERY DAY  . metroNIDAZOLE (METROCREAM) 0.75 % cream   . Misc Natural Products (FIBER 7) POWD Take by mouth. benefiber  . pantoprazole (PROTONIX) 40 MG tablet TAKE 1 TABLET BY MOUTH DAILY  . telmisartan-hydrochlorothiazide (MICARDIS HCT) 80-25 MG tablet Take 1 tablet by mouth daily.  Marland Kitchen venlafaxine XR (EFFEXOR-XR) 150 MG 24 hr capsule Take 1 capsule (150 mg total) by mouth daily.    PHQ 2/9 Scores 01/28/2019 01/09/2019 10/10/2018 08/29/2018  PHQ - 2 Score 0 2 6 1   PHQ- 9 Score 1 4 16 1    6CIT Screen 01/28/2019 11/13/2017  What Year? 0 points 0 points  What month? 0 points 0 points  What time? 0 points 3 points  Count back from 20 0 points 0 points  Months in reverse 0 points 0 points  Repeat phrase 4 points 4 points  Total Score 4 7      Physical Exam Constitutional:      Appearance: Normal appearance.  HENT:     Nose: Nose normal.  Neck:     Musculoskeletal: Normal range of motion and neck supple.  Cardiovascular:     Rate and Rhythm: Normal rate and regular rhythm.     Pulses: Normal pulses.  Pulmonary:     Effort: Pulmonary effort is normal. No respiratory distress.     Breath sounds: Normal breath sounds. No wheezing.  Abdominal:     General: Abdomen is flat.  Musculoskeletal:     Right shoulder: She exhibits decreased range of motion and tenderness. She exhibits no swelling and no effusion.  Neurological:     Mental Status: She is alert.      BP 124/80   Pulse 87   Ht 5\' 2"  (1.575 m)   Wt 176 lb (79.8 kg)   SpO2 97%   BMI 32.19 kg/m   Assessment and Plan: 1. History of endometrial cancer Completed chemo and XRT  2. DM type 2 without retinopathy ([place]) Controlled Continue diet and exercise along with medication - Basic metabolic panel - Hemoglobin A1c  3. Essential hypertension controlled  4. Acute pain of right shoulder Improving Adjust sleep position Continue tylenol or advil if needed   Partially dictated using Editor, commissioning. Any errors are unintentional.  Halina Maidens, MD [facility]  01/28/2019

## 2019-01-29 LAB — HEMOGLOBIN A1C
Est. average glucose Bld gHb Est-mCnc: 154 mg/dL
Hgb A1c MFr Bld: 7 % — ABNORMAL HIGH (ref 4.8–5.6)

## 2019-01-29 LAB — BASIC METABOLIC PANEL
BUN/Creatinine Ratio: 24 (ref 12–28)
BUN: 21 mg/dL (ref 8–27)
CO2: 24 mmol/L (ref 20–29)
Calcium: 9.6 mg/dL (ref 8.7–10.3)
Chloride: 97 mmol/L (ref 96–106)
Creatinine, Ser: 0.89 mg/dL (ref 0.57–1.00)
GFR, EST AFRICAN AMERICAN: 73 mL/min/{1.73_m2} (ref >59)
GFR, EST NON AFRICAN AMERICAN: 63 mL/min/{1.73_m2} (ref >59)
Glucose: 119 mg/dL — ABNORMAL HIGH (ref 65–99)
POTASSIUM: 3.7 mmol/L (ref 3.5–5.2)
Sodium: 138 mmol/L (ref 134–144)

## 2019-02-07 DIAGNOSIS — I6523 Occlusion and stenosis of bilateral carotid arteries: Secondary | ICD-10-CM | POA: Diagnosis not present

## 2019-02-07 DIAGNOSIS — E782 Mixed hyperlipidemia: Secondary | ICD-10-CM | POA: Diagnosis not present

## 2019-02-07 DIAGNOSIS — E119 Type 2 diabetes mellitus without complications: Secondary | ICD-10-CM | POA: Diagnosis not present

## 2019-02-07 DIAGNOSIS — I340 Nonrheumatic mitral (valve) insufficiency: Secondary | ICD-10-CM | POA: Diagnosis not present

## 2019-02-07 DIAGNOSIS — I10 Essential (primary) hypertension: Secondary | ICD-10-CM | POA: Diagnosis not present

## 2019-03-12 ENCOUNTER — Other Ambulatory Visit: Payer: Self-pay | Admitting: Internal Medicine

## 2019-03-12 ENCOUNTER — Telehealth: Payer: Self-pay

## 2019-03-12 DIAGNOSIS — E119 Type 2 diabetes mellitus without complications: Secondary | ICD-10-CM

## 2019-03-12 MED ORDER — SITAGLIPTIN PHOSPHATE 100 MG PO TABS: 100.0000 mg | ORAL_TABLET | Freq: Every day | ORAL | 0 refills | Status: DC

## 2019-03-12 NOTE — Telephone Encounter (Signed)
Patient wants 30 day Rx of [place] whe she stops by t pick up paperwork she needs to sign for [place].

## 2019-03-12 NOTE — Telephone Encounter (Signed)
I just sent in the [place] for 30 days.

## 2019-03-19 DIAGNOSIS — E782 Mixed hyperlipidemia: Secondary | ICD-10-CM | POA: Diagnosis not present

## 2019-03-19 DIAGNOSIS — R5383 Other fatigue: Secondary | ICD-10-CM | POA: Diagnosis not present

## 2019-03-19 DIAGNOSIS — I6523 Occlusion and stenosis of bilateral carotid arteries: Secondary | ICD-10-CM | POA: Diagnosis not present

## 2019-03-19 DIAGNOSIS — I350 Nonrheumatic aortic (valve) stenosis: Secondary | ICD-10-CM | POA: Diagnosis not present

## 2019-03-19 DIAGNOSIS — I10 Essential (primary) hypertension: Secondary | ICD-10-CM | POA: Diagnosis not present

## 2019-04-01 DIAGNOSIS — M7521 Bicipital tendinitis, right shoulder: Secondary | ICD-10-CM | POA: Diagnosis not present

## 2019-04-01 DIAGNOSIS — G8929 Other chronic pain: Secondary | ICD-10-CM | POA: Diagnosis not present

## 2019-04-01 DIAGNOSIS — M25511 Pain in right shoulder: Secondary | ICD-10-CM | POA: Diagnosis not present

## 2019-04-02 DIAGNOSIS — I350 Nonrheumatic aortic (valve) stenosis: Secondary | ICD-10-CM | POA: Diagnosis not present

## 2019-04-02 DIAGNOSIS — R5383 Other fatigue: Secondary | ICD-10-CM | POA: Diagnosis not present

## 2019-04-04 ENCOUNTER — Other Ambulatory Visit: Payer: Self-pay | Admitting: Internal Medicine

## 2019-04-04 ENCOUNTER — Other Ambulatory Visit: Payer: Self-pay

## 2019-04-04 ENCOUNTER — Encounter: Payer: Self-pay | Admitting: Internal Medicine

## 2019-04-04 DIAGNOSIS — E119 Type 2 diabetes mellitus without complications: Secondary | ICD-10-CM

## 2019-04-11 DIAGNOSIS — I10 Essential (primary) hypertension: Secondary | ICD-10-CM | POA: Diagnosis not present

## 2019-04-11 DIAGNOSIS — M7521 Bicipital tendinitis, right shoulder: Secondary | ICD-10-CM | POA: Diagnosis not present

## 2019-04-11 DIAGNOSIS — I119 Hypertensive heart disease without heart failure: Secondary | ICD-10-CM | POA: Insufficient documentation

## 2019-04-11 DIAGNOSIS — E782 Mixed hyperlipidemia: Secondary | ICD-10-CM | POA: Diagnosis not present

## 2019-04-11 DIAGNOSIS — I350 Nonrheumatic aortic (valve) stenosis: Secondary | ICD-10-CM | POA: Diagnosis not present

## 2019-04-24 ENCOUNTER — Encounter: Payer: Self-pay | Admitting: Internal Medicine

## 2019-04-24 ENCOUNTER — Ambulatory Visit (INDEPENDENT_AMBULATORY_CARE_PROVIDER_SITE_OTHER): Payer: Medicare HMO | Admitting: Internal Medicine

## 2019-04-24 ENCOUNTER — Other Ambulatory Visit: Payer: Self-pay

## 2019-04-24 VITALS — BP 128/78 | HR 68 | Ht 62.0 in | Wt 178.0 lb

## 2019-04-24 DIAGNOSIS — K5901 Slow transit constipation: Secondary | ICD-10-CM

## 2019-04-24 DIAGNOSIS — R1084 Generalized abdominal pain: Secondary | ICD-10-CM

## 2019-04-24 LAB — POCT URINALYSIS DIPSTICK
Bilirubin, UA: NEGATIVE
Blood, UA: NEGATIVE
Glucose, UA: NEGATIVE
Ketones, UA: NEGATIVE
Leukocytes, UA: NEGATIVE
Nitrite, UA: NEGATIVE
Protein, UA: NEGATIVE
Spec Grav, UA: 1.01 (ref 1.010–1.025)
Urobilinogen, UA: 0.2 E.U./dL
pH, UA: 6 (ref 5.0–8.0)

## 2019-04-24 NOTE — Patient Instructions (Signed)
Miralax - 1 cap daily for at least 5 days before adjusting the dose either up or down.

## 2019-04-24 NOTE — Progress Notes (Signed)
Date:  04/24/2019   Name:  Samantha Lang   DOB:  1941-06-29   MRN:  [id number]   Chief Complaint: Constipation (Last BM- this mornig but barely any came out. Started 5 days ago. After BM 5 days she had serious pelvic pain. She said she is having a lot gas pains and has taken a lot of laxative. )  Constipation  This is a recurrent problem. The problem has been gradually worsening since onset. Her stool frequency is 2 to 3 times per week. The stool is described as pellet like. The patient is not on a high fiber diet. She does not exercise regularly. There has not been adequate water intake. Pertinent negatives include no fever. Risk factors include immobility and stress. Treatments tried: she has not taken anything regularly.    Review of Systems  Constitutional: Negative for chills, fatigue, fever and unexpected weight change.  Eyes: Negative for visual disturbance.  Respiratory: Negative for cough, chest tightness, shortness of breath and wheezing.   Cardiovascular: Negative for chest pain, palpitations and leg swelling.  Gastrointestinal: Positive for abdominal distention (and gas) and constipation.  Endocrine: Negative for polydipsia and polyuria.  Genitourinary: Negative for dysuria and hematuria.  Neurological: Negative for dizziness and headaches.  Psychiatric/Behavioral: Negative for sleep disturbance.    Patient Active Problem List   Diagnosis Date Noted  . Foot pain, right 10/10/2018  . Ganglion cyst of left foot 08/29/2018  . Xerosis of skin 08/29/2018  . Palpitations 08/08/2018  . Colon cancer screening   . Trochanteric bursitis of left hip 12/07/2016  . History of endometrial cancer 08/03/2016  . Edema leg 04/13/2016  . DM type 2 without retinopathy ([place]) 03/14/2016  . Shoulder pain, left 02/12/2016  . Acid reflux 10/02/2015  . 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 Hyperlipidemia associated with type 2 diabetes mellitus  ([place]) 04/08/2015  . Aortic heart valve narrowing 03/26/2015  . Bilateral carotid artery stenosis 03/26/2015  . Anxiety   . Environmental and seasonal allergies   . Postmenopausal atrophic vaginitis   . Depression, major, recurrent, moderate ([place])   . 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 Essential hypertension     Allergies  Allergen Reactions  . Lipitor [Atorvastatin Calcium]     Legs ache  With higher dose  . Cephalexin Rash  . Clarithromycin Rash    Past Surgical History:  Procedure Laterality Date  . ABDOMINAL HYSTERECTOMY  07/2016  . BREAST BIOPSY Right 03/21/08   right, benign   . COLONOSCOPY WITH PROPOFOL N/A 09/22/2017   Procedure: COLONOSCOPY WITH PROPOFOL;  Surgeon: Lucilla Lame, MD;  Location: [facility];  Service: Gastroenterology;  Laterality: N/A;  diabetic  . EYE SURGERY  Oct 2009   for ptosis,  Dr. Rosaria Ferries, eyelid lift  . POLYPECTOMY  09/22/2017   Procedure: POLYPECTOMY;  Surgeon: Lucilla Lame, MD;  Location: [facility];  Service: Gastroenterology;;  . TUBAL LIGATION      Social History   Tobacco Use  . Smoking status: Former Smoker    Packs/day: 0.25    Years: 20.00    Pack years: 5.00    Types: Cigarettes    Last attempt to quit: 09/25/1988    Years since quitting: 30.5  . Smokeless tobacco: Never Used  Substance Use Topics  . Alcohol use: Yes    Alcohol/week: 6.0 standard drinks    Types: 2 Glasses of wine, 2 Cans of  beer, 2 Shots of liquor per week  . Drug use: No     Medication list has been reviewed and updated.  Current Meds  Medication Sig  . allopurinol (ZYLOPRIM) 100 MG tablet Take 1 tablet (100 mg total) by mouth daily.  Marland Kitchen amLODipine (NORVASC) 5 MG tablet 5 mg 2 (two) times daily.   Marland Kitchen aspirin 81 MG tablet Take 81 mg by mouth daily.  Marland Kitchen atorvastatin (LIPITOR) 10 MG tablet Take 1 tablet by mouth daily.  . carvedilol (COREG) 3.125 MG tablet TAKE 1 TABLET(3.125 MG) BY MOUTH TWICE DAILY  WITH MEALS  . colchicine 0.6 MG tablet Take 0.6 mg by mouth daily.  . diazepam (VALIUM) 5 MG tablet Take 0.5 tablets (2.5 mg total) by mouth every 12 (twelve) hours as needed (vertigo).  Marland Kitchen JANUVIA 100 MG tablet TAKE 1 TABLET BY MOUTH EVERY DAY  . metroNIDAZOLE (METROCREAM) 0.75 % cream   . Misc Natural Products (FIBER 7) POWD Take by mouth. benefiber  . pantoprazole (PROTONIX) 40 MG tablet TAKE 1 TABLET BY MOUTH DAILY  . telmisartan-hydrochlorothiazide (MICARDIS HCT) 80-25 MG tablet Take 1 tablet by mouth daily.  Marland Kitchen venlafaxine XR (EFFEXOR-XR) 150 MG 24 hr capsule Take 1 capsule (150 mg total) by mouth daily.    PHQ 2/9 Scores 04/24/2019 01/28/2019 01/09/2019 10/10/2018  PHQ - 2 Score 1 0 2 6  PHQ- 9 Score - 1 4 16     BP Readings from Last 3 Encounters:  04/24/19 128/78  01/28/19 124/80  01/09/19 (!) 142/72    Physical Exam Constitutional:      Appearance: Normal appearance.  Neck:     Musculoskeletal: Normal range of motion and neck supple.  Cardiovascular:     Rate and Rhythm: Normal rate and regular rhythm.     Pulses: Normal pulses.  Pulmonary:     Effort: Pulmonary effort is normal.     Breath sounds: Normal breath sounds. No wheezing or rales.  Abdominal:     General: Bowel sounds are normal. There is no distension.     Palpations: Abdomen is soft. There is no mass.     Tenderness: There is no guarding or rebound.     Hernia: No hernia is present.     Comments: Mild tenderness across the lower abdomen bilaterally  Neurological:     Mental Status: She is alert.  Psychiatric:        Attention and Perception: Attention and perception normal.     Wt Readings from Last 3 Encounters:  04/24/19 178 lb (80.7 kg)  01/28/19 176 lb (79.8 kg)  01/09/19 176 lb 6.4 oz (80 kg)    BP 128/78   Pulse 68   Ht 5\' 2"  (1.575 m)   Wt 178 lb (80.7 kg)   SpO2 95%   BMI 32.56 kg/m   Assessment and Plan: 1. Generalized abdominal pain UA negative - POCT Urinalysis Dipstick  2.  Slow transit constipation Begin Miralax 1 cap daily and titrate up or down to 1-2 stools per day Call in 10 days if not improved Increase fiber, exercise and water intake   Partially dictated using Editor, commissioning. Any errors are unintentional.  Halina Maidens, MD [facility]  04/24/2019

## 2019-04-30 ENCOUNTER — Other Ambulatory Visit: Payer: Self-pay | Admitting: Internal Medicine

## 2019-04-30 DIAGNOSIS — E119 Type 2 diabetes mellitus without complications: Secondary | ICD-10-CM

## 2019-06-17 ENCOUNTER — Other Ambulatory Visit: Payer: Self-pay | Admitting: Internal Medicine

## 2019-06-17 DIAGNOSIS — M10079 Idiopathic gout, unspecified ankle and foot: Secondary | ICD-10-CM

## 2019-07-20 ENCOUNTER — Encounter: Payer: Self-pay | Admitting: Emergency Medicine

## 2019-07-20 ENCOUNTER — Other Ambulatory Visit: Payer: Self-pay

## 2019-07-20 ENCOUNTER — Ambulatory Visit
Admission: EM | Admit: 2019-07-20 | Discharge: 2019-07-20 | Disposition: A | Discharge status: Home / Self Care | Payer: Medicare HMO | Attending: Family Medicine | Admitting: Family Medicine

## 2019-07-20 DIAGNOSIS — R1032 Left lower quadrant pain: Secondary | ICD-10-CM | POA: Diagnosis not present

## 2019-07-20 MED ORDER — CIPROFLOXACIN HCL 500 MG PO TABS: 500.0000 mg | ORAL_TABLET | Freq: Two times a day (BID) | ORAL | 0 refills | Status: DC

## 2019-07-20 MED ORDER — ONDANSETRON 8 MG PO TBDP: 8.0000 mg | ORAL_TABLET | Freq: Three times a day (TID) | ORAL | 0 refills | Status: DC | PRN

## 2019-07-20 MED ORDER — METRONIDAZOLE 500 MG PO TABS: 500.0000 mg | ORAL_TABLET | Freq: Three times a day (TID) | ORAL | 0 refills | Status: DC

## 2019-07-20 MED ORDER — ONDANSETRON 8 MG PO TBDP
8.0000 mg | ORAL_TABLET | Freq: Once | ORAL | Status: AC
  Administered 2019-07-20: 10:00:00 8 mg via ORAL

## 2019-07-20 NOTE — ED Triage Notes (Signed)
Patient c/o lower abdominal pain for the past 2-3 days.  Patient reports that last night she had some mucous with her bowel movement.  Patient denies any rectal bleeding.  Patient denies any urinary problems.  Patient reports having Diverticulosis 2 years ago.  Patient denies fevers.  Patient denies N/V/D.

## 2019-07-20 NOTE — Discharge Instructions (Addendum)
Clear liquids then advance diet slowly as tolerated Go to Emergency Department if symptoms are worsening or not improving

## 2019-07-20 NOTE — ED Provider Notes (Signed)
[facility]    CSN: [id number] Arrival date & time: 07/20/19  0845     History   Chief Complaint Chief Complaint  Patient presents with  . Abdominal Pain    HPI Samantha Lang is a 78 y.o. female.   78 yo female with a c/o left lower abdominal pain for the past 2-3 days. States she's had diverticulitis about 2 years ago and symptoms are similar. Denies any fevers, chills, constipation, diarrhea, rectal bleeding, vomiting.      Past Medical History:  Diagnosis Date  . Allergy   . Anxiety   . Aortic valve stenosis, mild   . Depression    mild  . Diabetes mellitus age 32  . GERD (gastroesophageal reflux disease)   . Heart murmur   . Hemorrhoids   . Hyperlipidemia   . Hypertension 2003  . Incontinence    Female stress  . Mitral incompetence   . Personal history of chemotherapy   . Personal history of radiation therapy   . Postmenopausal atrophic vaginitis   . Uterine cancer [facility])    treated    Patient Active Problem List   Diagnosis Date Noted  . Foot pain, right 10/10/2018  . Ganglion cyst of left foot 08/29/2018  . Xerosis of skin 08/29/2018  . Palpitations 08/08/2018  . Colon cancer screening   . Trochanteric bursitis of left hip 12/07/2016  . History of endometrial cancer 08/03/2016  . Edema leg 04/13/2016  . DM type 2 without retinopathy ([place]) 03/14/2016  . Shoulder pain, left 02/12/2016  . Acid reflux 10/02/2015  . 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 Hyperlipidemia associated with type 2 diabetes mellitus ([place]) 04/08/2015  . Aortic heart valve narrowing 03/26/2015  . Bilateral carotid artery stenosis 03/26/2015  . Anxiety   . Environmental and seasonal allergies   . Postmenopausal atrophic vaginitis   . Depression, major, recurrent, moderate ([place])   . 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 Essential hypertension     Past Surgical History:  Procedure  Laterality Date  . ABDOMINAL HYSTERECTOMY  07/2016  . BREAST BIOPSY Right 03/21/08   right, benign   . COLONOSCOPY WITH PROPOFOL N/A 09/22/2017   Procedure: COLONOSCOPY WITH PROPOFOL;  Surgeon: Lucilla Lame, MD;  Location: [facility];  Service: Gastroenterology;  Laterality: N/A;  diabetic  . EYE SURGERY  Oct 2009   for ptosis,  Dr. Rosaria Ferries, eyelid lift  . POLYPECTOMY  09/22/2017   Procedure: POLYPECTOMY;  Surgeon: Lucilla Lame, MD;  Location: [facility];  Service: Gastroenterology;;  . TUBAL LIGATION      OB History    Gravida  2   Para  2   Term  2   Preterm      AB      Living  2     SAB      TAB      Ectopic      Multiple      Live Births  2            Home Medications    Prior to Admission medications   Medication Sig Start Date End Date Taking? Authorizing Provider  allopurinol (ZYLOPRIM) 100 MG tablet TAKE 1 TABLET BY MOUTH EVERY DAY 06/17/19  Yes Glean Hess, MD  amLODipine (NORVASC) 5 MG tablet 5 mg 2 (two) times daily.  08/23/16  Yes [provider]  aspirin 81 MG tablet Take  81 mg by mouth daily.   Yes [provider]  atorvastatin (LIPITOR) 10 MG tablet Take 1 tablet by mouth daily. 07/12/15  Yes [provider]  carvedilol (COREG) 3.125 MG tablet TAKE 1 TABLET(3.125 MG) BY MOUTH TWICE DAILY WITH MEALS 10/20/16  Yes [provider]  JANUVIA 100 MG tablet TAKE 1 TABLET BY MOUTH EVERY DAY 04/30/19  Yes Glean Hess, MD  telmisartan-hydrochlorothiazide (MICARDIS HCT) 80-25 MG tablet Take 1 tablet by mouth daily. 06/08/17  Yes [provider]  venlafaxine XR (EFFEXOR-XR) 150 MG 24 hr capsule Take 1 capsule (150 mg total) by mouth daily. 10/30/18  Yes Glean Hess, MD  ciprofloxacin (CIPRO) 500 MG tablet Take 1 tablet (500 mg total) by mouth every 12 (twelve) hours. 07/20/19   Norval Gable, MD  colchicine 0.6 MG tablet Take 0.6 mg by mouth daily.    [provider]  diazepam  (VALIUM) 5 MG tablet Take 0.5 tablets (2.5 mg total) by mouth every 12 (twelve) hours as needed (vertigo). 12/25/18   Glean Hess, MD  metroNIDAZOLE (FLAGYL) 500 MG tablet Take 1 tablet (500 mg total) by mouth 3 (three) times daily. 07/20/19   Norval Gable, MD  metroNIDAZOLE (METROCREAM) 0.75 % cream  01/07/19   [provider]  Misc Natural Products (FIBER 7) POWD Take by mouth. benefiber    [provider]  ondansetron (ZOFRAN ODT) 8 MG disintegrating tablet Take 1 tablet (8 mg total) by mouth every 8 (eight) hours as needed. 07/20/19   Norval Gable, MD  pantoprazole (PROTONIX) 40 MG tablet TAKE 1 TABLET BY MOUTH DAILY 01/26/19   Glean Hess, MD    Family History Family History  Problem Relation Age of Onset  . Breast cancer Paternal Aunt   . Breast cancer Paternal Grandmother   . Cancer Father 81       lung  . Stroke Mother   . Hypertension Mother   . Cancer Brother        prostate  . Depression Brother   . Ovarian cancer Neg Hx   . Colon cancer Neg Hx   . Diabetes Neg Hx     Social History Social History   Tobacco Use  . Smoking status: Former Smoker    Packs/day: 0.25    Years: 20.00    Pack years: 5.00    Types: Cigarettes    Quit date: 09/25/1988    Years since quitting: 30.8  . Smokeless tobacco: Never Used  Substance Use Topics  . Alcohol use: Yes    Alcohol/week: 6.0 standard drinks    Types: 2 Glasses of wine, 2 Cans of beer, 2 Shots of liquor per week  . Drug use: No     Allergies   Lipitor [atorvastatin calcium], Cephalexin, and Clarithromycin   Review of Systems Review of Systems   Physical Exam Triage Vital Signs ED Triage Vitals  Enc Vitals Group     BP 07/20/19 0904 (!) 150/74     Pulse Rate 07/20/19 0904 73     Resp 07/20/19 0904 14     Temp 07/20/19 0904 98 F (36.7 C)     Temp Source 07/20/19 0904 Oral     SpO2 07/20/19 0904 98 %     Weight 07/20/19 0900 178 lb (80.7 kg)     Height 07/20/19 0900 5\' 2"   (1.575 m)     Head Circumference --      Peak Flow --  Pain Score 07/20/19 0900 4     Pain Loc --      Pain Edu? --      Excl. in [place]? --    No data found.  Updated Vital Signs BP (!) 150/74 (BP Location: Left Arm)   Pulse 73   Temp 98 F (36.7 C) (Oral)   Resp 14   Ht 5\' 2"  (1.575 m)   Wt 80.7 kg   SpO2 98%   BMI 32.56 kg/m   Visual Acuity Right Eye Distance:   Left Eye Distance:   Bilateral Distance:    Right Eye Near:   Left Eye Near:    Bilateral Near:     Physical Exam Vitals signs and nursing note reviewed.  Constitutional:      General: She is not in acute distress.    Appearance: She is not toxic-appearing or diaphoretic.  Cardiovascular:     Rate and Rhythm: Normal rate.  Pulmonary:     Effort: Pulmonary effort is normal. No respiratory distress.  Abdominal:     General: Bowel sounds are normal. There is no distension.     Palpations: Abdomen is soft. There is no mass.     Tenderness: There is abdominal tenderness (left lower quadrant; no rebound or guarding). There is no right CVA tenderness, left CVA tenderness, guarding or rebound.     Hernia: No hernia is present.  Neurological:     Mental Status: She is alert.      UC Treatments / Results  Labs (all labs ordered are listed, but only abnormal results are displayed) Labs Reviewed - No data to display  EKG   Radiology No results found.  Procedures Procedures (including critical care time)  Medications Ordered in UC Medications  ondansetron (ZOFRAN-ODT) disintegrating tablet 8 mg (8 mg Oral Given 07/20/19 0930)    Initial Impression / Assessment and Plan / UC Course  I have reviewed the triage vital signs and the nursing notes.  Pertinent labs & imaging results that were available during my care of the patient were reviewed by me and considered in my medical decision making (see chart for details).      Final Clinical Impressions(s) / UC Diagnoses   Final diagnoses:   Abdominal pain, left lower quadrant  (likely early diverticulitis)   Discharge Instructions     Clear liquids then advance diet slowly as tolerated Go to Emergency Department if symptoms are worsening or not improving    ED Prescriptions    Medication Sig Dispense Auth. Provider   ciprofloxacin (CIPRO) 500 MG tablet Take 1 tablet (500 mg total) by mouth every 12 (twelve) hours. 14 tablet Ares Tegtmeyer, Linward Foster, MD   metroNIDAZOLE (FLAGYL) 500 MG tablet Take 1 tablet (500 mg total) by mouth 3 (three) times daily. 21 tablet Luisdaniel Kenton, Linward Foster, MD   ondansetron (ZOFRAN ODT) 8 MG disintegrating tablet Take 1 tablet (8 mg total) by mouth every 8 (eight) hours as needed. 6 tablet Norval Gable, MD      1. diagnosis reviewed with patient 2. rx as per orders above; reviewed possible side effects, interactions, risks and benefits  3. Recommend supportive treatment as above 4. Follow-up prn if symptoms worsen or don't improve  Controlled Substance Prescriptions [place] Controlled Substance Registry consulted? Not Applicable   Norval Gable, MD 07/24/19 1151

## 2019-09-16 ENCOUNTER — Ambulatory Visit: Payer: Medicare HMO | Admitting: Internal Medicine

## 2019-10-09 ENCOUNTER — Other Ambulatory Visit: Payer: Self-pay

## 2019-10-09 ENCOUNTER — Ambulatory Visit (INDEPENDENT_AMBULATORY_CARE_PROVIDER_SITE_OTHER): Payer: Medicare HMO | Admitting: Internal Medicine

## 2019-10-09 ENCOUNTER — Encounter: Payer: Self-pay | Admitting: Internal Medicine

## 2019-10-09 VITALS — BP 130/70 | HR 64 | Ht 62.0 in | Wt 180.0 lb

## 2019-10-09 DIAGNOSIS — E118 Type 2 diabetes mellitus with unspecified complications: Secondary | ICD-10-CM

## 2019-10-09 DIAGNOSIS — E1169 Type 2 diabetes mellitus with other specified complication: Secondary | ICD-10-CM | POA: Diagnosis not present

## 2019-10-09 DIAGNOSIS — R69 Illness, unspecified: Secondary | ICD-10-CM | POA: Diagnosis not present

## 2019-10-09 DIAGNOSIS — Z23 Encounter for immunization: Secondary | ICD-10-CM | POA: Diagnosis not present

## 2019-10-09 DIAGNOSIS — E785 Hyperlipidemia, unspecified: Secondary | ICD-10-CM | POA: Diagnosis not present

## 2019-10-09 DIAGNOSIS — F331 Major depressive disorder, recurrent, moderate: Secondary | ICD-10-CM

## 2019-10-09 DIAGNOSIS — R194 Change in bowel habit: Secondary | ICD-10-CM

## 2019-10-09 DIAGNOSIS — I10 Essential (primary) hypertension: Secondary | ICD-10-CM

## 2019-10-09 MED ORDER — VENLAFAXINE HCL ER 150 MG PO CP24: 150.0000 mg | ORAL_CAPSULE | Freq: Every day | ORAL | 1 refills | Status: DC

## 2019-10-09 NOTE — Progress Notes (Signed)
Date:  10/09/2019    Name:  Samantha Lang   DOB:  Sep 04, 1941   MRN:  [id number]   Chief Complaint: Depression (Patient wants to reschedule her CPE and discuss problems today. PHQ9- 20, Anxiety- 6), Foot Pain (Bilateral on and off. Thinks she is having gout flare ups still in both feet. ), Immunizations (High dose flu shot.), and Abdominal Pain (Consitpation and then diarrhea on and off. Taking fiber and then she gets constipated, and has to take a laxative to go to the bathroom. Thinks her "gut" has something to do with her depression. Started about 2 years ago.  Dr Allen Norris referral. )  Mammogram 02/2018 Colonoscopy  08/2017 Immunizations UTD  Diabetes She presents for her follow-up diabetic visit. She has type 2 diabetes mellitus. Her disease course has been stable. Pertinent negatives for hypoglycemia include no dizziness or headaches. Pertinent negatives for diabetes include no chest pain and no fatigue. Current diabetic treatment includes oral agent (monotherapy) (januvia 100 mg). An ACE inhibitor/angiotensin II receptor blocker is being taken. Eye exam is current.  Hypertension This is a chronic problem. The problem is controlled. Pertinent negatives include no chest pain, headaches, palpitations or shortness of breath. Past treatments include angiotensin blockers, diuretics, beta blockers and calcium channel blockers.  Hyperlipidemia The problem is controlled. Pertinent negatives include no chest pain or shortness of breath. Current antihyperlipidemic treatment includes statins.  Depression        This is a chronic problem.  The problem has been rapidly worsening since onset.  Associated symptoms include decreased concentration, helplessness, hopelessness, irritable, restlessness, decreased interest and sad.  Associated symptoms include no fatigue, no headaches and no suicidal ideas. Constipation This is a recurrent problem. The problem has been waxing and waning since onset. Associated  symptoms include diarrhea. Pertinent negatives include no fever, nausea or rectal pain. Associated symptoms comments: Diarrhea alternating with constipation.  Taking fiber then has to take laxative -.  She also has episodes of fecal incontinence after using a laxative.  She wears a pad at all times.  She denies rectal pain or bleeding.  If she does not have a stool for 4 days she does not feel pain or bloating but takes something because she thinks she should.  She has not tried to see what pattern her bowels would have if she did not take anything.  Lab Results  Component Value Date   HGBA1C 7.0 (H) 01/28/2019   Lab Results  Component Value Date   CREATININE 0.89 01/28/2019   BUN 21 01/28/2019   NA 138 01/28/2019   K 3.7 01/28/2019   CL 97 01/28/2019   CO2 24 01/28/2019   Lab Results  Component Value Date   CHOL 179 09/03/2018   HDL 64 09/03/2018   LDLCALC 81 09/03/2018   LDLDIRECT 138.3 09/26/2011   TRIG 168 (H) 09/03/2018   CHOLHDL 2.8 09/03/2018     Review of Systems  Constitutional: Negative for chills, fatigue and fever.  Respiratory: Negative for cough, chest tightness, shortness of breath and wheezing.   Cardiovascular: Negative for chest pain, palpitations and leg swelling.  Gastrointestinal: Positive for constipation and diarrhea. Negative for nausea and rectal pain.  Musculoskeletal: Positive for arthralgias (both feet and ankles).  Neurological: Negative for dizziness, light-headedness and headaches.  Hematological: Negative for adenopathy.  Psychiatric/Behavioral: Positive for decreased concentration, depression, dysphoric mood and sleep disturbance. Negative for suicidal ideas.    Patient Active Problem List   Diagnosis Date Noted  .  LVH (left ventricular hypertrophy) due to hypertensive disease, without heart failure 04/11/2019  . Foot pain, right 10/10/2018  . Ganglion cyst of left foot 08/29/2018  . Xerosis of skin 08/29/2018  . Palpitations 08/08/2018  .  Colon cancer screening   . Trochanteric bursitis of left hip 12/07/2016  . History of endometrial cancer 08/03/2016  . Edema leg 04/13/2016  . Type II diabetes mellitus with complication ([facility]) [id number]  . Shoulder pain, left 02/12/2016  . Acid reflux 10/02/2015  . 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 Hyperlipidemia associated with type 2 diabetes mellitus ([facility]) 04/08/2015  . Aortic heart valve narrowing 03/26/2015  . Bilateral carotid artery stenosis 03/26/2015  . Anxiety   . Environmental and seasonal allergies   . Postmenopausal atrophic vaginitis   . Depression, major, recurrent, moderate ([place])   . 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 Essential hypertension     Allergies  Allergen Reactions  . Lipitor [Atorvastatin Calcium]     Legs ache  With higher dose  . Cephalexin Rash  . Clarithromycin Rash    Past Surgical History:  Procedure Laterality Date  . ABDOMINAL HYSTERECTOMY  07/2016  . BREAST BIOPSY Right 03/21/08   right, benign   . COLONOSCOPY WITH PROPOFOL N/A 09/22/2017   Procedure: COLONOSCOPY WITH PROPOFOL;  Surgeon: Lucilla Lame, MD;  Location: [facility];  Service: Gastroenterology;  Laterality: N/A;  diabetic  . EYE SURGERY  Oct 2009   for ptosis,  Dr. Rosaria Ferries, eyelid lift  . POLYPECTOMY  09/22/2017   Procedure: POLYPECTOMY;  Surgeon: Lucilla Lame, MD;  Location: [facility];  Service: Gastroenterology;;  . TUBAL LIGATION      Social History   Tobacco Use  . Smoking status: Former Smoker    Packs/day: 0.25    Years: 20.00    Pack years: 5.00    Types: Cigarettes    Quit date: 09/25/1988    Years since quitting: 31.0  . Smokeless tobacco: Never Used  Substance Use Topics  . Alcohol use: Yes    Alcohol/week: 6.0 standard drinks    Types: 2 Glasses of wine, 2 Cans of beer, 2 Shots of liquor per week  . Drug use: No     Medication list has been reviewed  and updated.  Current Meds  Medication Sig  . allopurinol (ZYLOPRIM) 100 MG tablet TAKE 1 TABLET BY MOUTH EVERY DAY  . amLODipine (NORVASC) 5 MG tablet 5 mg 2 (two) times daily.   Marland Kitchen aspirin 81 MG tablet Take 81 mg by mouth daily.  Marland Kitchen atorvastatin (LIPITOR) 10 MG tablet Take 1 tablet by mouth daily.  . carvedilol (COREG) 3.125 MG tablet TAKE 1 TABLET(3.125 MG) BY MOUTH TWICE DAILY WITH MEALS  . colchicine 0.6 MG tablet Take 0.6 mg by mouth daily.  . diazepam (VALIUM) 5 MG tablet Take 0.5 tablets (2.5 mg total) by mouth every 12 (twelve) hours as needed (vertigo).  Marland Kitchen JANUVIA 100 MG tablet TAKE 1 TABLET BY MOUTH EVERY DAY  . metroNIDAZOLE (METROCREAM) 0.75 % cream   . Misc Natural Products (FIBER 7) POWD Take by mouth. benefiber  . ondansetron (ZOFRAN ODT) 8 MG disintegrating tablet Take 1 tablet (8 mg total) by mouth every 8 (eight) hours as needed.  . pantoprazole (PROTONIX) 40 MG tablet TAKE 1 TABLET BY MOUTH DAILY  . telmisartan-hydrochlorothiazide (MICARDIS HCT) 80-25 MG tablet Take 1 tablet by mouth daily.  Marland Kitchen venlafaxine XR (EFFEXOR-XR) 150  MG 24 hr capsule Take 1 capsule (150 mg total) by mouth daily. (Patient taking differently: Take 150 mg by mouth daily. Only med that goes to [facility])    PHQ 2/9 Scores 10/09/2019 04/24/2019 01/28/2019 01/09/2019  PHQ - 2 Score 6 1 0 2  PHQ- 9 Score 20 - 1 4    BP Readings from Last 3 Encounters:  10/09/19 130/70  07/20/19 (!) 150/74  04/24/19 128/78    Physical Exam Vitals signs and nursing note reviewed.  Constitutional:      General: She is irritable. She is not in acute distress.    Appearance: She is well-developed.  HENT:     Head: Normocephalic and atraumatic.  Neck:     Musculoskeletal: Normal range of motion.     Vascular: No carotid bruit.  Cardiovascular:     Rate and Rhythm: Normal rate and regular rhythm.     Pulses: Normal pulses.     Heart sounds: No murmur.  Pulmonary:     Effort: Pulmonary effort is normal. No  respiratory distress.     Breath sounds: Normal breath sounds. No rhonchi.  Abdominal:     General: Bowel sounds are normal. There is distension.     Palpations: Abdomen is soft.     Tenderness: There is no abdominal tenderness.  Musculoskeletal: Normal range of motion.     Right lower leg: No edema.     Left lower leg: No edema.  Lymphadenopathy:     Cervical: No cervical adenopathy.  Skin:    General: Skin is warm and dry.     Findings: No rash.  Neurological:     Mental Status: She is alert and oriented to person, place, and time.  Psychiatric:        Attention and Perception: Attention normal.        Mood and Affect: Mood normal.        Behavior: Behavior normal.        Thought Content: Thought content normal. Thought content does not include suicidal ideation. Thought content does not include suicidal plan.        Cognition and Memory: Cognition normal.     Wt Readings from Last 3 Encounters:  10/09/19 180 lb (81.6 kg)  07/20/19 178 lb (80.7 kg)  04/24/19 178 lb (80.7 kg)    BP 130/70   Pulse 64   Ht 5\' 2"  (1.575 m)   Wt 180 lb (81.6 kg)   SpO2 97%   BMI 32.92 kg/m   Assessment and Plan: 1. Change in bowel habits Diarrhea alternating with constipation May be a consequence of pelvic radiation for uterine cancer Pt to stop all fiber supplements and anti-diarrheals Continue high fiber diet and sufficient fluids - Ambulatory referral to Gastroenterology  2. Depression, major, recurrent, moderate ([place]) Not doing as well currently on Effexor 150 mg per day She did not tolerate Welbutrin in the past due to anxiety Recommend that she find activities to do at home that she enjoys If worsening, will need further evaluation and medication adjustment - TSH  3. Essential hypertension Clinically stable exam with well controlled BP.   Tolerating medications, amlodipine 5 mg, coreg 3.125mg  bid, and telmisartan/hct 80-25 daily, without side effects at this time. Pt to  continue current regimen and low sodium diet; benefits of regular exercise as able discussed. - CBC with Differential/Platelet - Comprehensive metabolic panel  4. Type II diabetes mellitus with complication (HCC) Clinically stable by exam and report without s/s of  hypoglycemia. DM complicated by HTN, lipids. Tolerating medications januvia 100 mg daily well without side effects or other concerns. - Hemoglobin A1c  5. Hyperlipidemia associated with type 2 diabetes mellitus ([place]) Tolerating statin medication without side effects at this time LDL is at goal of < 70 on current dose Continue same therapy without change at this time. - Lipid panel   Partially dictated using Editor, commissioning. Any errors are unintentional.  Halina Maidens, MD [facility]  10/09/2019

## 2019-10-10 LAB — CBC WITH DIFFERENTIAL/PLATELET
Basophils Absolute: 0 10*3/uL (ref 0.0–0.2)
Basos: 1 %
EOS (ABSOLUTE): 0.3 10*3/uL (ref 0.0–0.4)
Eos: 5 %
Hematocrit: 39.7 % (ref 34.0–46.6)
Hemoglobin: 13.1 g/dL (ref 11.1–15.9)
Immature Grans (Abs): 0 10*3/uL (ref 0.0–0.1)
Immature Granulocytes: 0 %
Lymphocytes Absolute: 1.8 10*3/uL (ref 0.7–3.1)
Lymphs: 28 %
MCH: 30.8 pg (ref 26.6–33.0)
MCHC: 33 g/dL (ref 31.5–35.7)
MCV: 93 fL (ref 79–97)
Monocytes Absolute: 0.5 10*3/uL (ref 0.1–0.9)
Monocytes: 8 %
Neutrophils Absolute: 3.7 10*3/uL (ref 1.4–7.0)
Neutrophils: 58 %
Platelets: 236 10*3/uL (ref 150–450)
RBC: 4.26 x10E6/uL (ref 3.77–5.28)
RDW: 13.3 % (ref 11.7–15.4)
WBC: 6.4 10*3/uL (ref 3.4–10.8)

## 2019-10-10 LAB — COMPREHENSIVE METABOLIC PANEL
ALT: 34 IU/L — ABNORMAL HIGH (ref 0–32)
AST: 23 IU/L (ref 0–40)
Albumin/Globulin Ratio: 1.9 (ref 1.2–2.2)
Albumin: 4.4 g/dL (ref 3.7–4.7)
Alkaline Phosphatase: 89 IU/L (ref 39–117)
BUN/Creatinine Ratio: 25 (ref 12–28)
BUN: 21 mg/dL (ref 8–27)
Bilirubin Total: 0.6 mg/dL (ref 0.0–1.2)
CO2: 26 mmol/L (ref 20–29)
Calcium: 9.5 mg/dL (ref 8.7–10.3)
Chloride: 97 mmol/L (ref 96–106)
Creatinine, Ser: 0.83 mg/dL (ref 0.57–1.00)
GFR calc Af Amer: 79 mL/min/{1.73_m2} (ref >59)
GFR calc non Af Amer: 69 mL/min/{1.73_m2} (ref >59)
Globulin, Total: 2.3 g/dL (ref 1.5–4.5)
Glucose: 199 mg/dL — ABNORMAL HIGH (ref 65–99)
Potassium: 3.7 mmol/L (ref 3.5–5.2)
Sodium: 138 mmol/L (ref 134–144)
Total Protein: 6.7 g/dL (ref 6.0–8.5)

## 2019-10-10 LAB — LIPID PANEL
Chol/HDL Ratio: 2.5 ratio (ref 0.0–4.4)
Cholesterol, Total: 162 mg/dL (ref 100–199)
HDL: 64 mg/dL (ref >39)
LDL Chol Calc (NIH): 72 mg/dL (ref 0–99)
Triglycerides: 156 mg/dL — ABNORMAL HIGH (ref 0–149)
VLDL Cholesterol Cal: 26 mg/dL (ref 5–40)

## 2019-10-10 LAB — HEMOGLOBIN A1C
Est. average glucose Bld gHb Est-mCnc: 154 mg/dL
Hgb A1c MFr Bld: 7 % — ABNORMAL HIGH (ref 4.8–5.6)

## 2019-10-10 LAB — TSH: TSH: 2.6 u[IU]/mL (ref 0.450–4.500)

## 2019-10-15 ENCOUNTER — Encounter: Payer: Self-pay | Admitting: *Deleted

## 2019-10-16 ENCOUNTER — Telehealth: Payer: Self-pay | Admitting: Internal Medicine

## 2019-10-16 NOTE — Telephone Encounter (Signed)
Patient still having diarrhea and constipation on and off with abdominal pain. Please see previous msg. She has an appt with Dr Allen Norris Nov 23rd but having worsening issues now with blood in stool.

## 2019-10-16 NOTE — Telephone Encounter (Signed)
Patient complains of another flare within 24hrs/ stomach issues, this time its worse with some rectal bleeding. She is scheduled to see GI November 23.

## 2019-10-16 NOTE — Telephone Encounter (Signed)
Can you call GI to see if they can see her sooner?

## 2019-10-16 NOTE — Telephone Encounter (Signed)
Spoke with GI- patient appt moved to tomorrow at 120 PM.

## 2019-10-17 ENCOUNTER — Encounter: Payer: Self-pay | Admitting: Gastroenterology

## 2019-10-17 ENCOUNTER — Ambulatory Visit: Payer: Medicare HMO | Admitting: Gastroenterology

## 2019-10-17 ENCOUNTER — Other Ambulatory Visit: Payer: Self-pay

## 2019-10-17 VITALS — BP 157/71 | HR 68 | Temp 98.0°F | Ht 62.0 in | Wt 180.0 lb

## 2019-10-17 DIAGNOSIS — R194 Change in bowel habit: Secondary | ICD-10-CM

## 2019-10-17 MED ORDER — METAMUCIL SMOOTH TEXTURE 58.6 % PO POWD: ORAL | 1 refills | Status: DC

## 2019-10-17 NOTE — Patient Instructions (Signed)

## 2019-10-17 NOTE — Progress Notes (Signed)
[facility] [address]  [place]  [place], [place] [address]  Main: [phone]  Fax: [phone]   Gastroenterology Consultation  Referring Provider:     Glean Hess, MD Primary Care Physician:  Glean Hess, MD Reason for Consultation:    Altered bowel habits        HPI:    Chief Complaint  Patient presents with  . change in bowel habits    Patient has been having some constipation and diarrhea     Samantha Lang is a 78 y.o. y/o female referred for consultation & management  by Dr. Army Melia, Jesse Sans, MD.  Patient presents with 4 to 5 days of loose stools.  This occurred after a week of constipation.  Reports that she does not take anything for her bowels and does not think that she eats enough fiber, and goes days without having a bowel movement.  By the time she remembers that she has not had a bowel movement in days, she starts to have diarrhea.  About 3 to 4 days ago she started having loose stools which is already improved.  She has not had any bowel movements today.  Had 2 bowel movements yesterday, with type 5.  No weight loss.  No abdominal pain but did have some abdominal cramping during one of the bowel movements.  No nausea or vomiting.  Previous records reviewed and patient had a colonoscopy in 2018 with Dr. Allen Norris for screening which showed diverticulosis  Past Medical History:  Diagnosis Date  . Allergy   . Anxiety   . Aortic valve stenosis, mild   . Depression    mild  . Diabetes mellitus age 65  . GERD (gastroesophageal reflux disease)   . Heart murmur   . Hemorrhoids   . Hyperlipidemia   . Hypertension 2003  . Incontinence    Female stress  . Mitral incompetence   . Personal history of chemotherapy   . Personal history of radiation therapy   . Postmenopausal atrophic vaginitis   . Uterine cancer [facility])    treated    Past Surgical History:  Procedure Laterality Date  . ABDOMINAL HYSTERECTOMY  07/2016  . BREAST BIOPSY  Right 03/21/08   right, benign   . COLONOSCOPY WITH PROPOFOL N/A 09/22/2017   Procedure: COLONOSCOPY WITH PROPOFOL;  Surgeon: Lucilla Lame, MD;  Location: [facility];  Service: Gastroenterology;  Laterality: N/A;  diabetic  . EYE SURGERY  Oct 2009   for ptosis,  Dr. Rosaria Ferries, eyelid lift  . POLYPECTOMY  09/22/2017   Procedure: POLYPECTOMY;  Surgeon: Lucilla Lame, MD;  Location: [facility];  Service: Gastroenterology;;  . TUBAL LIGATION      Prior to Admission medications   Medication Sig Start Date End Date Taking? Authorizing Provider  allopurinol (ZYLOPRIM) 100 MG tablet TAKE 1 TABLET BY MOUTH EVERY DAY 06/17/19  Yes Glean Hess, MD  amLODipine (NORVASC) 5 MG tablet 5 mg 2 (two) times daily.  08/23/16  Yes [provider]  aspirin 81 MG tablet Take 81 mg by mouth daily.   Yes [provider]  atorvastatin (LIPITOR) 10 MG tablet Take 1 tablet by mouth daily. 07/12/15  Yes [provider]  carvedilol (COREG) 3.125 MG tablet TAKE 1 TABLET(3.125 MG) BY MOUTH TWICE DAILY WITH MEALS 10/20/16  Yes [provider]  colchicine 0.6 MG tablet Take 0.6 mg by mouth daily.   Yes [provider]  diazepam (VALIUM) 5 MG tablet  Take 0.5 tablets (2.5 mg total) by mouth every 12 (twelve) hours as needed (vertigo). 12/25/18  Yes Glean Hess, MD  JANUVIA 100 MG tablet TAKE 1 TABLET BY MOUTH EVERY DAY 04/30/19  Yes Glean Hess, MD  metroNIDAZOLE (METROCREAM) 0.75 % cream  01/07/19  Yes [provider]  Misc Natural Products (FIBER 7) POWD Take by mouth. benefiber   Yes [provider]  ondansetron (ZOFRAN ODT) 8 MG disintegrating tablet Take 1 tablet (8 mg total) by mouth every 8 (eight) hours as needed. 07/20/19  Yes Norval Gable, MD  pantoprazole (PROTONIX) 40 MG tablet TAKE 1 TABLET BY MOUTH DAILY 01/26/19  Yes Glean Hess, MD  telmisartan-hydrochlorothiazide (MICARDIS HCT) 80-25 MG tablet Take 1 tablet by mouth  daily. 06/08/17  Yes [provider]  venlafaxine XR (EFFEXOR-XR) 150 MG 24 hr capsule Take 1 capsule (150 mg total) by mouth daily. 10/09/19  Yes Glean Hess, MD  psyllium (METAMUCIL SMOOTH TEXTURE) 58.6 % powder Please use one does every other day. 10/17/19   Virgel Manifold, MD    Family History  Problem Relation Age of Onset  . Breast cancer Paternal Aunt   . Breast cancer Paternal Grandmother   . Cancer Father 42       lung  . Stroke Mother   . Hypertension Mother   . Cancer Brother        prostate  . Depression Brother   . Ovarian cancer Neg Hx   . Colon cancer Neg Hx   . Diabetes Neg Hx      Social History   Tobacco Use  . Smoking status: Former Smoker    Packs/day: 0.25    Years: 20.00    Pack years: 5.00    Types: Cigarettes    Quit date: 09/25/1988    Years since quitting: 31.0  . Smokeless tobacco: Never Used  Substance Use Topics  . Alcohol use: Yes    Alcohol/week: 6.0 standard drinks    Types: 2 Glasses of wine, 2 Cans of beer, 2 Shots of liquor per week  . Drug use: No    Allergies as of 10/17/2019 - Review Complete 10/17/2019  Allergen Reaction Noted  . Lipitor [atorvastatin calcium]  09/26/2011  . Cephalexin Rash 09/26/2011  . Clarithromycin Rash 09/26/2011    Review of Systems:    All systems reviewed and negative except where noted in HPI.   Physical Exam:  BP (!) 157/71 (BP Location: Left Arm, Patient Position: Sitting, Cuff Size: Normal)   Pulse 68   Temp 98 F (36.7 C) (Oral)   Ht 5\' 2"  (1.575 m)   Wt 180 lb (81.6 kg)   BMI 32.92 kg/m  No LMP recorded. Patient has had a hysterectomy. Psych:  Alert and cooperative. Normal mood and affect. General:   Alert,  Well-developed, well-nourished, pleasant and cooperative in NAD Head:  Normocephalic and atraumatic. Eyes:  Sclera clear, no icterus.   Conjunctiva pink. Ears:  Normal auditory acuity. Nose:  No deformity, discharge, or lesions. Mouth:  No deformity or  lesions,oropharynx pink & moist. Neck:  Supple; no masses or thyromegaly. Abdomen:  Normal bowel sounds.  No bruits.  Soft, non-tender and non-distended without masses, hepatosplenomegaly or hernias noted.  No guarding or rebound tenderness.    Msk:  Symmetrical without gross deformities. Good, equal movement & strength bilaterally. Pulses:  Normal pulses noted. Extremities:  No clubbing or edema.  No cyanosis. Neurologic:  Alert and oriented x3;  grossly  normal neurologically. Skin:  Intact without significant lesions or rashes. No jaundice. Lymph Nodes:  No significant cervical adenopathy. Psych:  Alert and cooperative. Normal mood and affect.   Labs: CBC    Component Value Date/Time   WBC 6.4 10/09/2019 0915   RBC 4.26 10/09/2019 0915   HGB 13.1 10/09/2019 0915   HCT 39.7 10/09/2019 0915   PLT 236 10/09/2019 0915   MCV 93 10/09/2019 0915   MCH 30.8 10/09/2019 0915   MCHC 33.0 10/09/2019 0915   RDW 13.3 10/09/2019 0915   LYMPHSABS 1.8 10/09/2019 0915   EOSABS 0.3 10/09/2019 0915   BASOSABS 0.0 10/09/2019 0915   CMP     Component Value Date/Time   NA 138 10/09/2019 0915   K 3.7 10/09/2019 0915   CL 97 10/09/2019 0915   CO2 26 10/09/2019 0915   GLUCOSE 199 (H) 10/09/2019 0915   GLUCOSE 123 (H) 09/26/2011 1203   BUN 21 10/09/2019 0915   CREATININE 0.83 10/09/2019 0915   CALCIUM 9.5 10/09/2019 0915   PROT 6.7 10/09/2019 0915   ALBUMIN 4.4 10/09/2019 0915   AST 23 10/09/2019 0915   ALT 34 (H) 10/09/2019 0915   ALKPHOS 89 10/09/2019 0915   BILITOT 0.6 10/09/2019 0915   GFRNONAA 69 10/09/2019 0915   GFRAA 79 10/09/2019 0915    Imaging Studies: No results found.  Assessment and Plan:   Samantha Lang is a 78 y.o. y/o female has been referred for altered bowel habits  Patient symptoms are likely due to not taking her fiber, getting constipated for days or weeks at a time and then eventually having possibly postobstructive diarrhea  High-fiber diet Metamucil  daily with goal of 1-2 soft bowel movements daily.  If not at goal, patient instructed to increase dose to twice daily.  If loose stools with the medication, patient asked to decrease the medication to every other day, or half dose daily.  Patient verbalized understanding   No alarm symptoms present  If symptoms do not improve, patient was asked to notify us  Follow-up in clinic in 6 to 8 weeks to reassess symptoms   Dr [facility]  Speech recognition software was used to dictate the above note.

## 2019-10-18 ENCOUNTER — Other Ambulatory Visit: Payer: Self-pay | Admitting: Internal Medicine

## 2019-10-25 ENCOUNTER — Other Ambulatory Visit: Payer: Self-pay | Admitting: Internal Medicine

## 2019-10-25 DIAGNOSIS — M10079 Idiopathic gout, unspecified ankle and foot: Secondary | ICD-10-CM

## 2019-10-31 DIAGNOSIS — C549 Malignant neoplasm of corpus uteri, unspecified: Secondary | ICD-10-CM | POA: Diagnosis not present

## 2019-11-11 ENCOUNTER — Other Ambulatory Visit: Payer: Self-pay

## 2019-11-11 DIAGNOSIS — E119 Type 2 diabetes mellitus without complications: Secondary | ICD-10-CM

## 2019-11-11 MED ORDER — SITAGLIPTIN PHOSPHATE 100 MG PO TABS: 100.0000 mg | ORAL_TABLET | Freq: Every day | ORAL | 3 refills | Status: DC

## 2019-11-11 NOTE — Progress Notes (Signed)
Refilled patients Januvia for 1 year to [facility]. I called the company and found out that there [facility] is called [facility] in [place].   Benedict Needy, CMA

## 2019-11-18 ENCOUNTER — Ambulatory Visit: Payer: Medicare HMO | Admitting: Gastroenterology

## 2019-12-11 ENCOUNTER — Ambulatory Visit: Payer: Medicare HMO | Admitting: Gastroenterology

## 2020-01-08 ENCOUNTER — Encounter: Payer: Self-pay | Admitting: Internal Medicine

## 2020-01-13 ENCOUNTER — Ambulatory Visit (INDEPENDENT_AMBULATORY_CARE_PROVIDER_SITE_OTHER): Payer: Medicare HMO

## 2020-01-13 DIAGNOSIS — Z0000 Encounter for general adult medical examination without abnormal findings: Secondary | ICD-10-CM

## 2020-01-13 DIAGNOSIS — Z780 Asymptomatic menopausal state: Secondary | ICD-10-CM | POA: Diagnosis not present

## 2020-01-13 DIAGNOSIS — Z1231 Encounter for screening mammogram for malignant neoplasm of breast: Secondary | ICD-10-CM

## 2020-01-13 NOTE — Progress Notes (Signed)
Subjective:   Samantha Lang is a 79 y.o. female who presents for Medicare Annual (Subsequent) preventive examination.  Virtual Visit via Telephone Note  I connected with YANI RUNKEL on 01/13/20 at 11:20 AM EST by telephone and verified that I am speaking with the correct person using two identifiers.  Medicare Annual Wellness visit completed telephonically due to Covid-19 pandemic.   Location: Patient: home Provider: office   I discussed the limitations, risks, security and privacy concerns of performing an evaluation and management service by telephone and the availability of in person appointments. The patient expressed understanding and agreed to proceed.  Some vital signs may be absent or patient reported.   Clemetine Marker, LPN     Review of Systems:   Cardiac Risk Factors include: advanced age (>109men, >73 women);diabetes mellitus;dyslipidemia;hypertension;obesity (BMI >30kg/m2)     Objective:     Vitals: There were no vitals taken for this visit.  There is no height or weight on file to calculate BMI.  Advanced Directives 01/13/2020 07/20/2019 01/09/2019 11/13/2017 09/22/2017 07/26/2017 08/03/2016  Does Patient Have a Medical Advance Directive? No No No No No No No  Would patient like information on creating a medical advance directive? Yes (MAU/Ambulatory/Procedural Areas - Information given) - Yes (MAU/Ambulatory/Procedural Areas - Information given) Yes (MAU/Ambulatory/Procedural Areas - Information given) Yes (Inpatient - patient requests chaplain consult to create a medical advance directive) No - Patient declined No - patient declined information    Tobacco Social History   Tobacco Use  Smoking Status Former Smoker  . Packs/day: 0.25  . Years: 20.00  . Pack years: 5.00  . Types: Cigarettes  . Quit date: 09/25/1988  . Years since quitting: 31.3  Smokeless Tobacco Never Used     Counseling given: Not Answered   Clinical Intake:  Pre-visit preparation  completed: Yes  Pain : 0-10 Pain Score: 7  Pain Type: Acute pain Pain Location: Hip Pain Orientation: Left Pain Descriptors / Indicators: Aching, Sore, Discomfort Pain Onset: 1 to 4 weeks ago Pain Frequency: Intermittent     Nutritional Risks: None Diabetes: Yes CBG done?: No Did pt. bring in CBG monitor from home?: No   Nutrition Risk Assessment:  Has the patient had any N/V/D within the last 2 months?  No  Does the patient have any non-healing wounds?  No  Has the patient had any unintentional weight loss or weight gain?  No   Diabetes:  Is the patient diabetic?  Yes  If diabetic, was a CBG obtained today?  No  Did the patient bring in their glucometer from home?  No  How often do you monitor your CBG's? Pt does not actively check blood sugar.   Financial Strains and Diabetes Management:  Are you having any financial strains with the device, your supplies or your medication? No .  Does the patient want to be seen by Chronic Care Management for management of their diabetes?  No  Would the patient like to be referred to a Nutritionist or for Diabetic Management?  No   Diabetic Exams:  Diabetic Eye Exam: Completed 10/18/18 negative retinopaty. Overdue for diabetic eye exam. Pt has been advised about the importance in completing this exam.   Diabetic Foot Exam: Completed 01/28/19.   How often do you need to have someone help you when you read instructions, pamphlets, or other written materials from your doctor or pharmacy?: 1 - Never  Interpreter Needed?: No  Information entered by :: Clemetine Marker LPN  Past  Medical History:  Diagnosis Date  . Allergy   . Anxiety   . Aortic valve stenosis, mild   . Depression    mild  . Diabetes mellitus age 37  . GERD (gastroesophageal reflux disease)   . Heart murmur   . Hemorrhoids   . Hyperlipidemia   . Hypertension 2003  . Incontinence    Female stress  . Mitral incompetence   . Personal history of chemotherapy   .  Personal history of radiation therapy   . Postmenopausal atrophic vaginitis   . Uterine cancer [facility])    treated   Past Surgical History:  Procedure Laterality Date  . ABDOMINAL HYSTERECTOMY  07/2016  . BREAST BIOPSY Right 03/21/08   right, benign   . COLONOSCOPY WITH PROPOFOL N/A 09/22/2017   Procedure: COLONOSCOPY WITH PROPOFOL;  Surgeon: Lucilla Lame, MD;  Location: [place];  Service: Gastroenterology;  Laterality: N/A;  diabetic  . EYE SURGERY  Oct 2009   for ptosis,  Dr. Rosaria Ferries, eyelid lift  . POLYPECTOMY  09/22/2017   Procedure: POLYPECTOMY;  Surgeon: Lucilla Lame, MD;  Location: [place];  Service: Gastroenterology;;  . TUBAL LIGATION     Family History  Problem Relation Age of Onset  . Breast cancer Paternal Aunt   . Breast cancer Paternal Grandmother   . Cancer Father 32       lung  . Stroke Mother   . Hypertension Mother   . Cancer Brother        prostate  . Depression Brother   . Ovarian cancer Neg Hx   . Colon cancer Neg Hx   . Diabetes Neg Hx    Social History   Socioeconomic History  . Marital status: Married    Spouse name: Not on file  . Number of children: 2  . Years of education: Not on file  . Highest education level: Master's degree (e.g., MA, MS, MEng, MEd, MSW, MBA)  Occupational History  . Occupation: Retired    Comment: Engineer, mining  Tobacco Use  . Smoking status: Former Smoker    Packs/day: 0.25    Years: 20.00    Pack years: 5.00    Types: Cigarettes    Quit date: 09/25/1988    Years since quitting: 31.3  . Smokeless tobacco: Never Used  Substance and Sexual Activity  . Alcohol use: Yes    Alcohol/week: 6.0 standard drinks    Types: 2 Glasses of wine, 2 Cans of beer, 2 Shots of liquor per week  . Drug use: No  . Sexual activity: Yes    Birth control/protection: Post-menopausal  Other Topics Concern  . Not on file  Social History Narrative  . Not on file   Social Determinants of Health   Financial  Resource Strain: Low Risk   . Difficulty of Paying Living Expenses: Not hard at all  Food Insecurity: No Food Insecurity  . Worried About Charity fundraiser in the Last Year: Never true  . Ran Out of Food in the Last Year: Never true  Transportation Needs: No Transportation Needs  . Lack of Transportation (Medical): No  . Lack of Transportation (Non-Medical): No  Physical Activity: Inactive  . Days of Exercise per Week: 0 days  . Minutes of Exercise per Session: 0 min  Stress: Stress Concern Present  . Feeling of Stress : To some extent  Social Connections: Somewhat Isolated  . Frequency of Communication with Friends and Family: More than three times a week  .  Frequency of Social Gatherings with Friends and Family: Twice a week  . Attends Religious Services: Never  . Active Member of Clubs or Organizations: No  . Attends Archivist Meetings: Never  . Marital Status: Married    Outpatient Encounter Medications as of 01/13/2020  Medication Sig  . allopurinol (ZYLOPRIM) 100 MG tablet TAKE 1 TABLET BY MOUTH EVERY DAY  . amLODipine (NORVASC) 5 MG tablet 5 mg 2 (two) times daily.   Marland Kitchen aspirin 81 MG tablet Take 81 mg by mouth daily.  Marland Kitchen atorvastatin (LIPITOR) 10 MG tablet Take 1 tablet by mouth daily.  . carvedilol (COREG) 3.125 MG tablet TAKE 1 TABLET(3.125 MG) BY MOUTH TWICE DAILY WITH MEALS  . colchicine 0.6 MG tablet Take 0.6 mg by mouth daily. PRN only, takes rarely  . pantoprazole (PROTONIX) 40 MG tablet TAKE 1 TABLET BY MOUTH EVERY DAY  . psyllium (METAMUCIL SMOOTH TEXTURE) 58.6 % powder Please use one does every other day.  . sitaGLIPtin (JANUVIA) 100 MG tablet Take 1 tablet (100 mg total) by mouth daily.  Marland Kitchen telmisartan-hydrochlorothiazide (MICARDIS HCT) 80-25 MG tablet Take 1 tablet by mouth daily.  Marland Kitchen venlafaxine XR (EFFEXOR-XR) 150 MG 24 hr capsule Take 1 capsule (150 mg total) by mouth daily.  . [DISCONTINUED] Misc Natural Products (FIBER 7) POWD Take by mouth.  Metamucil  . diazepam (VALIUM) 5 MG tablet Take 0.5 tablets (2.5 mg total) by mouth every 12 (twelve) hours as needed (vertigo). (Patient not taking: Reported on 01/13/2020)  . [DISCONTINUED] metroNIDAZOLE (METROCREAM) 0.75 % cream   . [DISCONTINUED] ondansetron (ZOFRAN ODT) 8 MG disintegrating tablet Take 1 tablet (8 mg total) by mouth every 8 (eight) hours as needed.   No facility-administered encounter medications on file as of 01/13/2020.    Activities of Daily Living In your present state of health, do you have any difficulty performing the following activities: 01/13/2020  Hearing? Y  Comment mild hearing difficulty, no hearing aids  Vision? N  Difficulty concentrating or making decisions? N  Walking or climbing stairs? Y  Dressing or bathing? N  Doing errands, shopping? N  Preparing Food and eating ? N  Using the Toilet? N  In the past six months, have you accidently leaked urine? Y  Comment wears pads for protection  Do you have problems with loss of bowel control? N  Managing your Medications? N  Managing your Finances? N  Housekeeping or managing your Housekeeping? N  Some recent data might be hidden    Patient Care Team: Glean Hess, MD as PCP - General (Internal Medicine) [place] (Ophthalmology) Monia Pouch Consuello Masse, MD as Referring Physician (Obstetrics and Gynecology) Corey Skains, MD as Consulting Physician (Cardiology)    Assessment:   This is a routine wellness examination for [place].  Exercise Activities and Dietary recommendations Current Exercise Habits: The patient does not participate in regular exercise at present, Exercise limited by: orthopedic condition(s)  Goals    . Exercise 150 min/wk Moderate Activity     Recommend to exercise at least 3-4 times per week for at least 30-40 minutes       Fall Risk Fall Risk  01/13/2020 04/24/2019 01/28/2019 01/09/2019 11/13/2017  Falls in the past year? 1 0 0 1 No  Number falls in past yr: 1 0  0 1 -  Comment mechanical fall - - - -  Injury with Fall? 0 0 0 0 -  Risk for fall due to : History of fall(s) - - - -  Follow up Falls prevention discussed Falls evaluation completed Falls evaluation completed - -   FALL RISK PREVENTION PERTAINING TO THE HOME:  Any stairs in or around the home? Yes  If so, do they handrails? Yes   Home free of loose throw rugs in walkways, pet beds, electrical cords, etc? Yes  Adequate lighting in your home to reduce risk of falls? Yes   ASSISTIVE DEVICES UTILIZED TO PREVENT FALLS:  Life alert? No  Use of a cane, walker or w/c? No  Grab bars in the bathroom? No  Shower chair or bench in shower? No  Elevated toilet seat or a handicapped toilet? No   DME ORDERS:  DME order needed?  No   TIMED UP AND GO:  Was the test performed? No . Telephonic visit.    Education: Fall risk prevention has been discussed.  Intervention(s) required? No   Depression Screen PHQ 2/9 Scores 01/13/2020 10/09/2019 04/24/2019 01/28/2019  PHQ - 2 Score 1 6 1  0  PHQ- 9 Score - 20 - 1     Cognitive Function - Pt declined 6CIT for 2021 AWV.      6CIT Screen 01/28/2019 11/13/2017  What Year? 0 points 0 points  What month? 0 points 0 points  What time? 0 points 3 points  Count back from 20 0 points 0 points  Months in reverse 0 points 0 points  Repeat phrase 4 points 4 points  Total Score 4 7    Immunization History  Administered Date(s) Administered  . Fluad Quad(high Dose 65+) 10/09/2019  . Influenza, High Dose Seasonal PF 11/12/2018  . Influenza,inj,Quad PF,6+ Mos 10/02/2015, 11/07/2016, 11/01/2017  . Pneumococcal Conjugate-13 02/05/2014  . Pneumococcal Polysaccharide-23 06/09/2008  . Tdap 06/09/2010    Qualifies for Shingles Vaccine? Yes . Due for Shingrix. Education has been provided regarding the importance of this vaccine. Pt has been advised to call insurance company to determine out of pocket expense. Advised may also receive vaccine at local pharmacy  or Health Dept. Verbalized acceptance and understanding.  Tdap: Up to date  Flu Vaccine: Up to date  Pneumococcal Vaccine: Up to date   Screening Tests Health Maintenance  Topic Date Due  . OPHTHALMOLOGY EXAM  10/19/2019  . FOOT EXAM  01/29/2020  . HEMOGLOBIN A1C  04/08/2020  . TETANUS/TDAP  06/09/2020  . INFLUENZA VACCINE  Completed  . DEXA SCAN  Completed  . PNA vac Low Risk Adult  Completed   Cancer Screenings:  Colorectal Screening: Completed 09/22/17.  No longer required.   Mammogram: Completed 02/27/18. Repeat every year. Ordered today. Pt provided with contact information and advised to call to schedule appt.   Bone Density: Completed 02/27/18. Results reflect NORMAL. Repeat every 2 years. Ordered today. Pt provided with contact information and advised to call to schedule appt.   Lung Cancer Screening: (Low Dose CT Chest recommended if Age 56-80 years, 30 pack-year currently smoking OR have quit w/in 15years.) does not qualify.   Additional Screening:  Hepatitis C Screening: no longer required  Vision Screening: Recommended annual ophthalmology exams for early detection of glaucoma and other disorders of the eye. Is the patient up to date with their annual eye exam?  No  - postponed due to Covid-19 Who is the provider or what is the name of the office in which the pt attends annual eye exams? [facility] Screening: Recommended annual dental exams for proper oral hygiene  Community Resource Referral:  CRR required this visit?  No  Plan:     I have personally reviewed and addressed the Medicare Annual Wellness questionnaire and have noted the following in the patient's chart:  A. Medical and social history B. Use of alcohol, tobacco or illicit drugs  C. Current medications and supplements D. Functional ability and status E.  Nutritional status F.  Physical activity G. Advance directives H. List of other physicians I.  Hospitalizations,  surgeries, and ER visits in previous 12 months J.  [place] such as hearing and vision if needed, cognitive and depression L. Referrals and appointments   In addition, I have reviewed and discussed with patient certain preventive protocols, quality metrics, and best practice recommendations. A written personalized care plan for preventive services as well as general preventive health recommendations were provided to patient.   Signed,  Clemetine Marker, LPN Nurse Health Advisor   Nurse Notes: pt c/o left hip pain for the past week causing difficulty climbing stairs. Pt plans to contact Dr. Army Melia for appt.

## 2020-01-13 NOTE — Patient Instructions (Signed)
[name] , Thank you for taking time to come for your Medicare Wellness Visit. I appreciate your ongoing commitment to your health goals. Please review the following plan we discussed and let me know if I can assist you in the future.   Screening recommendations/referrals: Colonoscopy: done 09/22/17 Mammogram: done 02/27/18. Please call [phone] to schedule your mammogram and bone density screening.  Bone Density: done 02/27/18 Recommended yearly ophthalmology/optometry visit for glaucoma screening and checkup Recommended yearly dental visit for hygiene and checkup  Vaccinations: Influenza vaccine: done 10/09/19 Pneumococcal vaccine: done 02/05/15 Tdap vaccine: done 06/09/10 Shingles vaccine: Shingrix discussed. Please contact your pharmacy for coverage information.   Advanced directives: Advance directive discussed with you today. I have provided a copy for you to complete at home and have notarized. Once this is complete please bring a copy in to our office so we can scan it into your chart.  Conditions/risks identified:  Free hearing clinics offered in [place]:   [facility] [address], [place], [place] [address] [phone]  [facility] [place], [place],  [address] [phone]   Next appointment: Please follow up in one year for your Medicare Annual Wellness visit.     Preventive Care 58 Years and Older, Female Preventive care refers to lifestyle choices and visits with your health care provider that can promote health and wellness. What does preventive care include?  A yearly physical exam. This is also called an annual well check.  Dental exams once or twice a year.  Routine eye exams. Ask your health care provider how often you should have your eyes checked.  Personal lifestyle choices, including:  Daily care of your teeth and gums.  Regular physical activity.  Eating a healthy diet.  Avoiding tobacco and  drug use.  Limiting alcohol use.  Practicing safe sex.  Taking low-dose aspirin every day.  Taking vitamin and mineral supplements as recommended by your health care provider. What happens during an annual well check? The services and screenings done by your health care provider during your annual well check will depend on your age, overall health, lifestyle risk factors, and family history of disease. Counseling  Your health care provider may ask you questions about your:  Alcohol use.  Tobacco use.  Drug use.  Emotional well-being.  Home and relationship well-being.  Sexual activity.  Eating habits.  History of falls.  Memory and ability to understand (cognition).  Work and work Statistician.  Reproductive health. Screening  You may have the following tests or measurements:  Height, weight, and BMI.  Blood pressure.  Lipid and cholesterol levels. These may be checked every 5 years, or more frequently if you are over 24 years old.  Skin check.  Lung cancer screening. You may have this screening every year starting at age 44 if you have a 30-pack-year history of smoking and currently smoke or have quit within the past 15 years.  Fecal occult blood test (FOBT) of the stool. You may have this test every year starting at age 80.  Flexible sigmoidoscopy or colonoscopy. You may have a sigmoidoscopy every 5 years or a colonoscopy every 10 years starting at age 3.  Hepatitis C blood test.  Hepatitis B blood test.  Sexually transmitted disease (STD) testing.  Diabetes screening. This is done by checking your blood sugar (glucose) after you have not eaten for a while (fasting). You may have this done every 1-3 years.  Bone density scan.  This is done to screen for osteoporosis. You may have this done starting at age 80.  Mammogram. This may be done every 1-2 years. Talk to your health care provider about how often you should have regular mammograms. Talk with your  health care provider about your test results, treatment options, and if necessary, the need for more tests. Vaccines  Your health care provider may recommend certain vaccines, such as:  Influenza vaccine. This is recommended every year.  Tetanus, diphtheria, and acellular pertussis (Tdap, Td) vaccine. You may need a Td booster every 10 years.  Zoster vaccine. You may need this after age 20.  Pneumococcal 13-valent conjugate (PCV13) vaccine. One dose is recommended after age 60.  Pneumococcal polysaccharide (PPSV23) vaccine. One dose is recommended after age 70. Talk to your health care provider about which screenings and vaccines you need and how often you need them. This information is not intended to replace advice given to you by your health care provider. Make sure you discuss any questions you have with your health care provider. Document Released: 01/08/2016 Document Revised: 08/31/2016 Document Reviewed: 10/13/2015 Elsevier Interactive Patient Education  2017 [place] Prevention in the Home Falls can cause injuries. They can happen to people of all ages. There are many things you can do to make your home safe and to help prevent falls. What can I do on the outside of my home?  Regularly fix the edges of walkways and driveways and fix any cracks.  Remove anything that might make you trip as you walk through a door, such as a raised step or threshold.  Trim any bushes or trees on the path to your home.  Use bright outdoor lighting.  Clear any walking paths of anything that might make someone trip, such as rocks or tools.  Regularly check to see if handrails are loose or broken. Make sure that both sides of any steps have handrails.  Any raised decks and porches should have guardrails on the edges.  Have any leaves, snow, or ice cleared regularly.  Use sand or salt on walking paths during winter.  Clean up any spills in your garage right away. This includes oil  or grease spills. What can I do in the bathroom?  Use night lights.  Install grab bars by the toilet and in the tub and shower. Do not use towel bars as grab bars.  Use non-skid mats or decals in the tub or shower.  If you need to sit down in the shower, use a plastic, non-slip stool.  Keep the floor dry. Clean up any water that spills on the floor as soon as it happens.  Remove soap buildup in the tub or shower regularly.  Attach bath mats securely with double-sided non-slip rug tape.  Do not have throw rugs and other things on the floor that can make you trip. What can I do in the bedroom?  Use night lights.  Make sure that you have a light by your bed that is easy to reach.  Do not use any sheets or blankets that are too big for your bed. They should not hang down onto the floor.  Have a firm chair that has side arms. You can use this for support while you get dressed.  Do not have throw rugs and other things on the floor that can make you trip. What can I do in the kitchen?  Clean up any spills right away.  Avoid walking on wet floors.  Keep items that you use a lot in easy-to-reach places.  If you need to reach something above you, use a strong step stool that has a grab bar.  Keep electrical cords out of the way.  Do not use floor polish or wax that makes floors slippery. If you must use wax, use non-skid floor wax.  Do not have throw rugs and other things on the floor that can make you trip. What can I do with my stairs?  Do not leave any items on the stairs.  Make sure that there are handrails on both sides of the stairs and use them. Fix handrails that are broken or loose. Make sure that handrails are as long as the stairways.  Check any carpeting to make sure that it is firmly attached to the stairs. Fix any carpet that is loose or worn.  Avoid having throw rugs at the top or bottom of the stairs. If you do have throw rugs, attach them to the floor with  carpet tape.  Make sure that you have a light switch at the top of the stairs and the bottom of the stairs. If you do not have them, ask someone to add them for you. What else can I do to help prevent falls?  Wear shoes that:  Do not have high heels.  Have rubber bottoms.  Are comfortable and fit you well.  Are closed at the toe. Do not wear sandals.  If you use a stepladder:  Make sure that it is fully opened. Do not climb a closed stepladder.  Make sure that both sides of the stepladder are locked into place.  Ask someone to hold it for you, if possible.  Clearly mark and make sure that you can see:  Any grab bars or handrails.  First and last steps.  Where the edge of each step is.  Use tools that help you move around (mobility aids) if they are needed. These include:  Canes.  Walkers.  Scooters.  Crutches.  Turn on the lights when you go into a dark area. Replace any light bulbs as soon as they burn out.  Set up your furniture so you have a clear path. Avoid moving your furniture around.  If any of your floors are uneven, fix them.  If there are any pets around you, be aware of where they are.  Review your medicines with your doctor. Some medicines can make you feel dizzy. This can increase your chance of falling. Ask your doctor what other things that you can do to help prevent falls. This information is not intended to replace advice given to you by your health care provider. Make sure you discuss any questions you have with your health care provider. Document Released: 10/08/2009 Document Revised: 05/19/2016 Document Reviewed: 01/16/2015 Elsevier Interactive Patient Education  2017 [employer].

## 2020-02-11 ENCOUNTER — Ambulatory Visit: Payer: Medicare HMO | Admitting: Internal Medicine

## 2020-03-02 ENCOUNTER — Other Ambulatory Visit: Payer: Self-pay | Admitting: Internal Medicine

## 2020-03-02 ENCOUNTER — Inpatient Hospital Stay: Admission: RE | Admit: 2020-03-02 | Payer: Medicare HMO | Source: Ambulatory Visit

## 2020-03-02 DIAGNOSIS — Z1231 Encounter for screening mammogram for malignant neoplasm of breast: Secondary | ICD-10-CM

## 2020-03-03 DIAGNOSIS — R69 Illness, unspecified: Secondary | ICD-10-CM | POA: Diagnosis not present

## 2020-03-04 ENCOUNTER — Ambulatory Visit: Payer: Medicare HMO | Admitting: Internal Medicine

## 2020-03-04 ENCOUNTER — Ambulatory Visit
Admission: RE | Admit: 2020-03-04 | Discharge: 2020-03-04 | Disposition: A | Discharge status: Home / Self Care | Payer: Medicare HMO | Source: Ambulatory Visit | Attending: Internal Medicine | Admitting: Internal Medicine

## 2020-03-04 ENCOUNTER — Other Ambulatory Visit: Payer: Self-pay

## 2020-03-04 DIAGNOSIS — Z1231 Encounter for screening mammogram for malignant neoplasm of breast: Secondary | ICD-10-CM | POA: Diagnosis not present

## 2020-03-04 DIAGNOSIS — M85851 Other specified disorders of bone density and structure, right thigh: Secondary | ICD-10-CM | POA: Diagnosis not present

## 2020-03-04 DIAGNOSIS — Z780 Asymptomatic menopausal state: Secondary | ICD-10-CM | POA: Insufficient documentation

## 2020-03-06 ENCOUNTER — Other Ambulatory Visit: Payer: Self-pay

## 2020-03-06 ENCOUNTER — Encounter: Payer: Self-pay | Admitting: Internal Medicine

## 2020-03-06 ENCOUNTER — Other Ambulatory Visit: Payer: Self-pay | Admitting: Internal Medicine

## 2020-03-06 ENCOUNTER — Ambulatory Visit (INDEPENDENT_AMBULATORY_CARE_PROVIDER_SITE_OTHER): Payer: Medicare HMO | Admitting: Internal Medicine

## 2020-03-06 VITALS — BP 130/62 | HR 66 | Ht 62.0 in | Wt 179.0 lb

## 2020-03-06 DIAGNOSIS — I10 Essential (primary) hypertension: Secondary | ICD-10-CM

## 2020-03-06 DIAGNOSIS — H9192 Unspecified hearing loss, left ear: Secondary | ICD-10-CM

## 2020-03-06 DIAGNOSIS — M10079 Idiopathic gout, unspecified ankle and foot: Secondary | ICD-10-CM

## 2020-03-06 DIAGNOSIS — M7062 Trochanteric bursitis, left hip: Secondary | ICD-10-CM | POA: Diagnosis not present

## 2020-03-06 DIAGNOSIS — E118 Type 2 diabetes mellitus with unspecified complications: Secondary | ICD-10-CM | POA: Diagnosis not present

## 2020-03-06 MED ORDER — AMLODIPINE BESYLATE 5 MG PO TABS: 5.0000 mg | ORAL_TABLET | Freq: Two times a day (BID) | ORAL | 3 refills | Status: DC

## 2020-03-06 MED ORDER — PREDNISONE 10 MG PO TABS: 10.0000 mg | ORAL_TABLET | ORAL | 0 refills | Status: AC

## 2020-03-06 NOTE — Progress Notes (Signed)
Date:  03/06/2020   Name:  Samantha Lang   DOB:  Feb 24, 1941   MRN:  [id number]   Chief Complaint: Hip Pain (hurting for a couple of months- climbing stairs with L) leg leading, or starting to walk from sitting for a while. Has woke up with hurting in the middle of the night- taking Advil and helps )  Hip Pain  There was no injury mechanism. The pain is present in the left hip. The quality of the pain is described as aching. The pain is moderate. Pertinent negatives include no muscle weakness or numbness. The symptoms are aggravated by movement and weight bearing. She has tried NSAIDs for the symptoms. The treatment provided mild relief.  Diabetes She presents for her follow-up diabetic visit. She has type 2 diabetes mellitus. Her disease course has been stable. Pertinent negatives for hypoglycemia include no headaches or tremors. Pertinent negatives for diabetes include no chest pain, no fatigue, no polydipsia and no polyuria. Current diabetic treatment includes oral agent (dual therapy). She is compliant with treatment all of the time. Her weight is stable. She is following a generally healthy diet. There is no compliance with monitoring of blood glucose. An ACE inhibitor/angiotensin II receptor blocker is being taken.  Hypertension This is a chronic problem. The problem is controlled. Pertinent negatives include no chest pain, headaches, palpitations or shortness of breath. Past treatments include calcium channel blockers, beta blockers, diuretics and angiotensin blockers. The current treatment provides significant improvement.    Lab Results  Component Value Date   CREATININE 0.83 10/09/2019   BUN 21 10/09/2019   NA 138 10/09/2019   K 3.7 10/09/2019   CL 97 10/09/2019   CO2 26 10/09/2019   Lab Results  Component Value Date   CHOL 162 10/09/2019   HDL 64 10/09/2019   LDLCALC 72 10/09/2019   LDLDIRECT 138.3 09/26/2011   TRIG 156 (H) 10/09/2019   CHOLHDL 2.5 10/09/2019   Lab  Results  Component Value Date   TSH 2.600 10/09/2019   Lab Results  Component Value Date   HGBA1C 7.0 (H) 10/09/2019     Review of Systems  Constitutional: Negative for appetite change, fatigue, fever and unexpected weight change.  HENT: Negative for tinnitus and trouble swallowing.   Eyes: Negative for visual disturbance.  Respiratory: Negative for cough, chest tightness and shortness of breath.   Cardiovascular: Negative for chest pain, palpitations and leg swelling.  Gastrointestinal: Negative for abdominal pain.  Endocrine: Negative for polydipsia and polyuria.  Genitourinary: Negative for dysuria and hematuria.  Musculoskeletal: Positive for arthralgias and gait problem. Negative for joint swelling.  Neurological: Negative for tremors, numbness and headaches.  Psychiatric/Behavioral: Negative for dysphoric mood.    Patient Active Problem List   Diagnosis Date Noted  . 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 LVH (left ventricular hypertrophy) due to hypertensive disease, without heart failure 04/11/2019  . Foot pain, right 10/10/2018  . Ganglion cyst of left foot 08/29/2018  . Xerosis of skin 08/29/2018  . Palpitations 08/08/2018  . Colon cancer screening   . Trochanteric bursitis of left hip 12/07/2016  . History of endometrial cancer 08/03/2016  . Edema leg 04/13/2016  . Type II diabetes mellitus with complication ([place]) [id number]  . Shoulder pain, left 02/12/2016  . Acid reflux 10/02/2015  . 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 Hyperlipidemia associated with type 2 diabetes mellitus ([place]) 04/08/2015  . Aortic heart valve narrowing 03/26/2015  .  Bilateral carotid artery stenosis 03/26/2015  . Anxiety   . Environmental and seasonal allergies   . Postmenopausal atrophic vaginitis   . Depression, major, recurrent, moderate ([place])   . 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 Essential  hypertension     Allergies  Allergen Reactions  . Lipitor [Atorvastatin Calcium]     Legs ache  With higher dose  . Cephalexin Rash  . Clarithromycin Rash    Past Surgical History:  Procedure Laterality Date  . ABDOMINAL HYSTERECTOMY  07/2016  . BREAST BIOPSY Right 03/21/08   right, benign   . COLONOSCOPY WITH PROPOFOL N/A 09/22/2017   Procedure: COLONOSCOPY WITH PROPOFOL;  Surgeon: Lucilla Lame, MD;  Location: [facility];  Service: Gastroenterology;  Laterality: N/A;  diabetic  . EYE SURGERY  Oct 2009   for ptosis,  Dr. Rosaria Ferries, eyelid lift  . POLYPECTOMY  09/22/2017   Procedure: POLYPECTOMY;  Surgeon: Lucilla Lame, MD;  Location: [facility];  Service: Gastroenterology;;  . TUBAL LIGATION      Social History   Tobacco Use  . Smoking status: Former Smoker    Packs/day: 0.25    Years: 20.00    Pack years: 5.00    Types: Cigarettes    Quit date: 09/25/1988    Years since quitting: 31.4  . Smokeless tobacco: Never Used  Substance Use Topics  . Alcohol use: Yes    Alcohol/week: 6.0 standard drinks    Types: 2 Glasses of wine, 2 Cans of beer, 2 Shots of liquor per week  . Drug use: No     Medication list has been reviewed and updated.  Current Meds  Medication Sig  . allopurinol (ZYLOPRIM) 100 MG tablet TAKE 1 TABLET BY MOUTH EVERY DAY  . amLODipine (NORVASC) 5 MG tablet Take 1 tablet (5 mg total) by mouth 2 (two) times daily.  Marland Kitchen aspirin 81 MG tablet Take 81 mg by mouth daily.  Marland Kitchen atorvastatin (LIPITOR) 10 MG tablet Take 1 tablet by mouth daily.  . carvedilol (COREG) 3.125 MG tablet TAKE 1 TABLET(3.125 MG) BY MOUTH TWICE DAILY WITH MEALS  . colchicine 0.6 MG tablet Take 0.6 mg by mouth daily. PRN only, takes rarely  . pantoprazole (PROTONIX) 40 MG tablet TAKE 1 TABLET BY MOUTH EVERY DAY  . psyllium (METAMUCIL SMOOTH TEXTURE) 58.6 % powder Please use one does every other day.  . sitaGLIPtin (JANUVIA) 100 MG tablet Take 1 tablet (100 mg total) by mouth  daily.  Marland Kitchen telmisartan-hydrochlorothiazide (MICARDIS HCT) 80-25 MG tablet Take 1 tablet by mouth daily.  Marland Kitchen venlafaxine XR (EFFEXOR-XR) 150 MG 24 hr capsule Take 1 capsule (150 mg total) by mouth daily.  . [DISCONTINUED] amLODipine (NORVASC) 5 MG tablet 5 mg 2 (two) times daily.     PHQ 2/9 Scores 03/06/2020 01/13/2020 10/09/2019 04/24/2019  PHQ - 2 Score 3 1 6 1   PHQ- 9 Score 4 - 20 -    BP Readings from Last 3 Encounters:  03/06/20 130/62  10/17/19 (!) 157/71  10/09/19 130/70    Physical Exam Vitals and nursing note reviewed.  Constitutional:      General: She is not in acute distress.    Appearance: Normal appearance. She is well-developed.  HENT:     Head: Normocephalic and atraumatic.     Right Ear: Tympanic membrane and ear canal normal.     Left Ear: Tympanic membrane and ear canal normal.  Cardiovascular:     Rate and Rhythm: Normal  rate and regular rhythm.     Pulses: Normal pulses.     Heart sounds: No murmur.  Pulmonary:     Effort: Pulmonary effort is normal. No respiratory distress.     Breath sounds: No wheezing or rhonchi.  Musculoskeletal:     Lumbar back: Negative right straight leg raise test and negative left straight leg raise test.     Right hip: No crepitus. Normal range of motion. Normal strength.     Left hip: Tenderness present. No crepitus. Normal range of motion. Normal strength.     Right lower leg: No edema.     Left lower leg: No edema.  Skin:    General: Skin is warm and dry.     Findings: No rash.  Neurological:     Mental Status: She is alert and oriented to person, place, and time.  Psychiatric:        Attention and Perception: Attention normal.        Mood and Affect: Mood normal.     Wt Readings from Last 3 Encounters:  03/06/20 179 lb (81.2 kg)  10/17/19 180 lb (81.6 kg)  10/09/19 180 lb (81.6 kg)    BP 130/62   Pulse 66   Ht 5\' 2"  (1.575 m)   Wt 179 lb (81.2 kg)   SpO2 99%   BMI 32.74 kg/m   Assessment and Plan: 1.  Trochanteric bursitis of left hip Recommend heat alternating with ice; topical rubs Steroid taper - may need Ortho for bursal injection - predniSONE (DELTASONE) 10 MG tablet; Take 1 tablet (10 mg total) by mouth as directed for 6 days. Take 6,5,4,3,2,1 then stop  Dispense: 21 tablet; Refill: 0  2. Essential hypertension Clinically stable exam with well controlled BP on 4 agents. Tolerating medications without side effects at this time. Pt to continue current regimen and low sodium diet; benefits of regular exercise as able discussed. - amLODipine (NORVASC) 5 MG tablet; Take 1 tablet (5 mg total) by mouth 2 (two) times daily.  Dispense: 180 tablet; Refill: 3  3. Type II diabetes mellitus with complication (HCC) Clinically stable by exam and report without s/s of hypoglycemia. DM complicated by HTN. Tolerating medications well without side effects or other concerns. - Hemoglobin [id number] - Basic metabolic panel  4. Hearing loss of left ear, unspecified hearing loss type Recommend ENT evaluation   Partially dictated using [place]. Any errors are unintentional.  Halina Maidens, MD [facility]  03/06/2020

## 2020-03-07 LAB — BASIC METABOLIC PANEL
BUN/Creatinine Ratio: 23 (ref 12–28)
BUN: 21 mg/dL (ref 8–27)
CO2: 27 mmol/L (ref 20–29)
Calcium: 9 mg/dL (ref 8.7–10.3)
Chloride: 98 mmol/L (ref 96–106)
Creatinine, Ser: 0.92 mg/dL (ref 0.57–1.00)
GFR calc Af Amer: 69 mL/min/{1.73_m2} (ref >59)
GFR calc non Af Amer: 60 mL/min/{1.73_m2} (ref >59)
Glucose: 148 mg/dL — ABNORMAL HIGH (ref 65–99)
Potassium: 3.5 mmol/L (ref 3.5–5.2)
Sodium: 141 mmol/L (ref 134–144)

## 2020-03-07 LAB — HEMOGLOBIN A1C
Est. average glucose Bld gHb Est-mCnc: 163 mg/dL
Hgb A1c MFr Bld: 7.3 % — ABNORMAL HIGH (ref 4.8–5.6)

## 2020-03-16 ENCOUNTER — Telehealth: Payer: Self-pay

## 2020-03-16 NOTE — Telephone Encounter (Signed)
Completed Patient assistance form and put in the mailbox to be picked up in the AM.  Patient informed.   CM

## 2020-04-13 DIAGNOSIS — I350 Nonrheumatic aortic (valve) stenosis: Secondary | ICD-10-CM | POA: Diagnosis not present

## 2020-04-13 DIAGNOSIS — I119 Hypertensive heart disease without heart failure: Secondary | ICD-10-CM | POA: Diagnosis not present

## 2020-04-13 DIAGNOSIS — E782 Mixed hyperlipidemia: Secondary | ICD-10-CM | POA: Diagnosis not present

## 2020-04-13 DIAGNOSIS — I10 Essential (primary) hypertension: Secondary | ICD-10-CM | POA: Diagnosis not present

## 2020-04-13 DIAGNOSIS — I6523 Occlusion and stenosis of bilateral carotid arteries: Secondary | ICD-10-CM | POA: Diagnosis not present

## 2020-04-20 DIAGNOSIS — M79671 Pain in right foot: Secondary | ICD-10-CM | POA: Diagnosis not present

## 2020-04-20 DIAGNOSIS — M19071 Primary osteoarthritis, right ankle and foot: Secondary | ICD-10-CM | POA: Diagnosis not present

## 2020-04-20 DIAGNOSIS — M79672 Pain in left foot: Secondary | ICD-10-CM | POA: Diagnosis not present

## 2020-04-20 DIAGNOSIS — E119 Type 2 diabetes mellitus without complications: Secondary | ICD-10-CM | POA: Diagnosis not present

## 2020-04-20 DIAGNOSIS — M19072 Primary osteoarthritis, left ankle and foot: Secondary | ICD-10-CM | POA: Diagnosis not present

## 2020-04-21 ENCOUNTER — Other Ambulatory Visit: Payer: Self-pay

## 2020-04-21 DIAGNOSIS — E119 Type 2 diabetes mellitus without complications: Secondary | ICD-10-CM

## 2020-04-21 MED ORDER — SITAGLIPTIN PHOSPHATE 100 MG PO TABS: 100.0000 mg | ORAL_TABLET | Freq: Every day | ORAL | 0 refills | Status: DC

## 2020-04-21 NOTE — Telephone Encounter (Signed)
Spoke with patient. She said [employer] denied her application because we did not fill out her medication list propery on the form. They do not allow attachments. Also, she said we need to do a written prescription with the form for he patient.  She will bring in her papers to re-due, she wants [place] to call her so she can overnight the forms when they are complete.   CM

## 2020-04-21 NOTE — Telephone Encounter (Signed)
Patient call regarding status of patient assistance forms.  She stated that it was regarding [employer].  Please call patient back at [phone]

## 2020-04-22 ENCOUNTER — Telehealth: Payer: Self-pay

## 2020-04-22 NOTE — Telephone Encounter (Signed)
Called patient and left her VM letting her know her [employer] forms are completed and ready for pick up. Told her our lunch times so she will not come during lunch hour but told her she can pick up the forms otherwise between 8am and 4pm.  CM

## 2020-04-29 ENCOUNTER — Other Ambulatory Visit: Payer: Self-pay | Admitting: Internal Medicine

## 2020-04-30 DIAGNOSIS — C541 Malignant neoplasm of endometrium: Secondary | ICD-10-CM | POA: Diagnosis not present

## 2020-04-30 DIAGNOSIS — Z8542 Personal history of malignant neoplasm of other parts of uterus: Secondary | ICD-10-CM | POA: Diagnosis not present

## 2020-05-01 ENCOUNTER — Other Ambulatory Visit: Payer: Self-pay | Admitting: Internal Medicine

## 2020-05-01 DIAGNOSIS — E119 Type 2 diabetes mellitus without complications: Secondary | ICD-10-CM

## 2020-05-18 DIAGNOSIS — M1612 Unilateral primary osteoarthritis, left hip: Secondary | ICD-10-CM | POA: Diagnosis not present

## 2020-05-18 DIAGNOSIS — M25552 Pain in left hip: Secondary | ICD-10-CM | POA: Diagnosis not present

## 2020-07-13 NOTE — Progress Notes (Signed)
Date:  07/14/2020   Name:  Samantha Lang   DOB:  [birth year]   MRN:  [id number]   Chief Complaint: Annual Exam (Breast Exam. No pap- aged out.)  Samantha Lang is a 79 y.o. female who presents today for her Complete Annual Exam. She feels well. She reports exercising - none at this time. She reports she is sleeping fairly well. Breast complaints - none.  Mammogram: 02/2020 DEXA: 02/2020 Pap smear: discontinued/hysterectomy Colonoscopy: 08/2017 Eye exam  - scheduled today  Immunization History  Administered Date(s) Administered  . Fluad Quad(high Dose 65+) 10/09/2019  . Influenza, High Dose Seasonal PF 11/12/2018  . Influenza,inj,Quad PF,6+ Mos 10/02/2015, 11/07/2016, 11/01/2017  . Pneumococcal Conjugate-13 02/05/2014  . Pneumococcal Polysaccharide-23 06/09/2008  . Tdap 06/09/2010    Diabetes She presents for her follow-up diabetic visit. She has type 2 diabetes mellitus. Pertinent negatives for hypoglycemia include no dizziness, headaches, nervousness/anxiousness or tremors. Pertinent negatives for diabetes include no chest pain, no fatigue, no polydipsia and no polyuria. Current diabetic treatments: januvia. She is compliant with treatment all of the time. There is no compliance with monitoring of blood glucose. An ACE inhibitor/angiotensin II receptor blocker is being taken.  Hypertension This is a chronic problem. The problem is controlled. Pertinent negatives include no chest pain, headaches, palpitations or shortness of breath. Past treatments include angiotensin blockers, diuretics, calcium channel blockers and beta blockers. The current treatment provides significant improvement.  Hyperlipidemia The problem is controlled. Pertinent negatives include no chest pain or shortness of breath. Current antihyperlipidemic treatment includes statins. The current treatment provides significant improvement of lipids.    Lab Results  Component Value Date   CREATININE 0.92 03/06/2020     BUN 21 03/06/2020   NA 141 03/06/2020   K 3.5 03/06/2020   CL 98 03/06/2020   CO2 27 03/06/2020   Lab Results  Component Value Date   CHOL 162 10/09/2019   HDL 64 10/09/2019   LDLCALC 72 10/09/2019   LDLDIRECT 138.3 09/26/2011   TRIG 156 (H) 10/09/2019   CHOLHDL 2.5 10/09/2019   Lab Results  Component Value Date   TSH 2.600 10/09/2019   Lab Results  Component Value Date   HGBA1C 7.3 (H) 03/06/2020   Lab Results  Component Value Date   WBC 6.4 10/09/2019   HGB 13.1 10/09/2019   HCT 39.7 10/09/2019   MCV 93 10/09/2019   PLT 236 10/09/2019   Lab Results  Component Value Date   ALT 34 (H) 10/09/2019   AST 23 10/09/2019   ALKPHOS 89 10/09/2019   BILITOT 0.6 10/09/2019     Review of Systems  Constitutional: Negative for chills, fatigue and fever.  HENT: Negative for congestion, hearing loss, tinnitus, trouble swallowing and voice change.   Eyes: Negative for visual disturbance.  Respiratory: Negative for cough, chest tightness, shortness of breath and wheezing.   Cardiovascular: Negative for chest pain, palpitations and leg swelling.  Gastrointestinal: Negative for abdominal pain, constipation, diarrhea and vomiting.  Endocrine: Negative for polydipsia and polyuria.  Genitourinary: Negative for dysuria, frequency, genital sores, vaginal bleeding and vaginal discharge.  Musculoskeletal: Negative for arthralgias, gait problem and joint swelling.  Skin: Negative for color change and rash.  Neurological: Negative for dizziness, tremors, light-headedness and headaches.  Hematological: Negative for adenopathy. Does not bruise/bleed easily.  Psychiatric/Behavioral: Negative for dysphoric mood and sleep disturbance. The patient is not nervous/anxious.     Patient Active Problem List   Diagnosis Date Noted  . Hearing  loss of left ear 03/06/2020  . LVH (left ventricular hypertrophy) due to hypertensive disease, without heart failure 04/11/2019  . Foot pain, right  10/10/2018  . Ganglion cyst of left foot 08/29/2018  . Xerosis of skin 08/29/2018  . Palpitations 08/08/2018  . Colon cancer screening   . Trochanteric bursitis of left hip 12/07/2016  . History of endometrial cancer 08/03/2016  . Edema leg 04/13/2016  . Type II diabetes mellitus with complication ([place]) 83/15/1761  . Shoulder pain, left 02/12/2016  . Acid reflux 10/02/2015  . 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 Hyperlipidemia associated with type 2 diabetes mellitus ([place]) 04/08/2015  . Aortic heart valve narrowing 03/26/2015  . Bilateral carotid artery stenosis 03/26/2015  . Anxiety   . Environmental and seasonal allergies   . Postmenopausal atrophic vaginitis   . Depression, major, recurrent, moderate ([place])   . 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 Essential hypertension     Allergies  Allergen Reactions  . Lipitor [Atorvastatin Calcium]     Legs ache  With higher dose  . Cephalexin Rash  . Clarithromycin Rash    Past Surgical History:  Procedure Laterality Date  . ABDOMINAL HYSTERECTOMY  07/2016  . BREAST BIOPSY Right 03/21/08   right, benign   . COLONOSCOPY WITH PROPOFOL N/A 09/22/2017   Procedure: COLONOSCOPY WITH PROPOFOL;  Surgeon: Lucilla Lame, MD;  Location: [facility];  Service: Gastroenterology;  Laterality: N/A;  diabetic  . EYE SURGERY  Oct 2009   for ptosis,  Dr. Rosaria Ferries, eyelid lift  . POLYPECTOMY  09/22/2017   Procedure: POLYPECTOMY;  Surgeon: Lucilla Lame, MD;  Location: [facility];  Service: Gastroenterology;;  . TUBAL LIGATION      Social History   Tobacco Use  . Smoking status: Former Smoker    Packs/day: 0.25    Years: 20.00    Pack years: 5.00    Types: Cigarettes    Quit date: 09/25/1988    Years since quitting: 31.8  . Smokeless tobacco: Never Used  Vaping Use  . Vaping Use: Never used  Substance Use Topics  . Alcohol use: Yes     Alcohol/week: 6.0 standard drinks    Types: 2 Glasses of wine, 2 Cans of beer, 2 Shots of liquor per week  . Drug use: No     Medication list has been reviewed and updated.  Current Meds  Medication Sig  . allopurinol (ZYLOPRIM) 100 MG tablet TAKE 1 TABLET BY MOUTH EVERY DAY  . amLODipine (NORVASC) 5 MG tablet Take 1 tablet (5 mg total) by mouth 2 (two) times daily.  Marland Kitchen aspirin 81 MG tablet Take 81 mg by mouth daily.  Marland Kitchen atorvastatin (LIPITOR) 10 MG tablet Take 1 tablet by mouth daily.  . Calcium Carbonate-Vit D-Min (CALCIUM 1200 PO) Take by mouth.  . calcium-vitamin D (OSCAL WITH D) 500-200 MG-UNIT tablet Take 1 tablet by mouth.  . carvedilol (COREG) 3.125 MG tablet TAKE 1 TABLET(3.125 MG) BY MOUTH TWICE DAILY WITH MEALS  . colchicine 0.6 MG tablet Take 0.6 mg by mouth daily. PRN only, takes rarely  . diazepam (VALIUM) 5 MG tablet Take 0.5 tablets (2.5 mg total) by mouth every 12 (twelve) hours as needed (vertigo).  Marland Kitchen JANUVIA 100 MG tablet TAKE 1 TABLET BY MOUTH EVERY DAY  . Multiple Vitamin (MULTIVITAMIN) capsule Take 1 capsule by mouth daily.  . pantoprazole (PROTONIX) 40 MG tablet TAKE 1 TABLET BY MOUTH EVERY  DAY  . psyllium (METAMUCIL SMOOTH TEXTURE) 58.6 % powder Please use one does every other day.  . telmisartan-hydrochlorothiazide (MICARDIS HCT) 80-25 MG tablet Take 1 tablet by mouth daily.  Marland Kitchen venlafaxine XR (EFFEXOR-XR) 150 MG 24 hr capsule TAKE (1) CAPSULE BY MOUTH EVERY DAY    PHQ 2/9 Scores 07/14/2020 03/06/2020 01/13/2020 10/09/2019  PHQ - 2 Score 2 3 1 6   PHQ- 9 Score 5 4 - 20    GAD 7 : Generalized Anxiety Score 07/14/2020 03/06/2020 10/09/2019  Nervous, Anxious, on Edge 0 0 1  Control/stop worrying 1 0 2  Worry too much - different things 1 0 2  Trouble relaxing 0 0 0  Restless 0 0 0  Easily annoyed or irritable 0 0 1  Afraid - awful might happen 0 0 0  Total GAD 7 Score 2 0 6  Anxiety Difficulty Not difficult at all - Not difficult at all    BP Readings from  Last 3 Encounters:  07/14/20 138/80  03/06/20 130/62  10/17/19 (!) 157/71    Physical Exam Vitals and nursing note reviewed.  Constitutional:      General: She is not in acute distress.    Appearance: She is well-developed.  HENT:     Head: Normocephalic and atraumatic.     Right Ear: Tympanic membrane and ear canal normal.     Left Ear: Tympanic membrane and ear canal normal.     Nose:     Right Sinus: No maxillary sinus tenderness.     Left Sinus: No maxillary sinus tenderness.  Eyes:     General: No scleral icterus.       Right eye: No discharge.        Left eye: No discharge.     Conjunctiva/sclera: Conjunctivae normal.  Neck:     Thyroid: No thyromegaly.     Vascular: No carotid bruit.  Cardiovascular:     Rate and Rhythm: Normal rate and regular rhythm.     Pulses: Normal pulses.     Heart sounds: Normal heart sounds.  Pulmonary:     Effort: Pulmonary effort is normal. No respiratory distress.     Breath sounds: No wheezing.  Chest:     Breasts:        Right: No mass, nipple discharge, skin change or tenderness.        Left: No mass, nipple discharge, skin change or tenderness.  Abdominal:     General: Bowel sounds are normal.     Palpations: Abdomen is soft.     Tenderness: There is no abdominal tenderness.  Musculoskeletal:        General: Normal range of motion.     Cervical back: Normal range of motion. No erythema.     Right lower leg: No edema.     Left lower leg: No edema.  Lymphadenopathy:     Cervical: No cervical adenopathy.  Skin:    General: Skin is warm and dry.     Findings: No rash.  Neurological:     Mental Status: She is alert and oriented to person, place, and time.     Cranial Nerves: No cranial nerve deficit.     Sensory: No sensory deficit.     Deep Tendon Reflexes: Reflexes are normal and symmetric.  Psychiatric:        Attention and Perception: Attention normal.        Mood and Affect: Mood normal.     Wt Readings from Last 3  Encounters:  07/14/20 177 lb (80.3 kg)  03/06/20 179 lb (81.2 kg)  10/17/19 180 lb (81.6 kg)    BP 138/80   Pulse 64   Temp 98 F (36.7 C) (Oral)   Ht 5\' 2"  (1.575 m)   Wt 177 lb (80.3 kg)   SpO2 98%   BMI 32.37 kg/m   Assessment and Plan: 1. Annual physical exam Normal exam except for weight Encourage some sort of exercise daily - POCT urinalysis dipstick  2. Encounter for screening mammogram for breast cancer Recent mammogram normal  3. Need for hepatitis C screening test - Hepatitis C antibody  4. Type II diabetes mellitus with complication (HCC) Clinically stable by exam and report without s/s of hypoglycemia. DM complicated by HTN. Tolerating medications - Januvia-  well without side effects or other concerns. - Comprehensive metabolic panel - Hemoglobin A1c - TSH  5. Essential hypertension Clinically stable exam with well controlled BP. Tolerating medications without side effects at this time. Pt to continue current regimen and low sodium diet; benefits of regular exercise as able discussed. - CBC with Differential/Platelet  6. Hyperlipidemia associated with type 2 diabetes mellitus ([place]) On appropriate statin therapy - Lipid panel  7. Depression, major, recurrent, moderate (HCC) Clinically stable on current regimen with good control of symptoms, No SI or HI. Will continue current therapy.    Partially dictated using Editor, commissioning. Any errors are unintentional.  Halina Maidens, MD [facility]  07/14/2020

## 2020-07-14 ENCOUNTER — Other Ambulatory Visit: Payer: Self-pay

## 2020-07-14 ENCOUNTER — Encounter: Payer: Self-pay | Admitting: Internal Medicine

## 2020-07-14 ENCOUNTER — Ambulatory Visit (INDEPENDENT_AMBULATORY_CARE_PROVIDER_SITE_OTHER): Payer: Medicare HMO | Admitting: Internal Medicine

## 2020-07-14 VITALS — BP 138/80 | HR 64 | Temp 98.0°F | Ht 62.0 in | Wt 177.0 lb

## 2020-07-14 DIAGNOSIS — F331 Major depressive disorder, recurrent, moderate: Secondary | ICD-10-CM | POA: Diagnosis not present

## 2020-07-14 DIAGNOSIS — I10 Essential (primary) hypertension: Secondary | ICD-10-CM

## 2020-07-14 DIAGNOSIS — Z1231 Encounter for screening mammogram for malignant neoplasm of breast: Secondary | ICD-10-CM

## 2020-07-14 DIAGNOSIS — E119 Type 2 diabetes mellitus without complications: Secondary | ICD-10-CM | POA: Diagnosis not present

## 2020-07-14 DIAGNOSIS — Z0000 Encounter for general adult medical examination without abnormal findings: Secondary | ICD-10-CM | POA: Diagnosis not present

## 2020-07-14 DIAGNOSIS — E785 Hyperlipidemia, unspecified: Secondary | ICD-10-CM | POA: Diagnosis not present

## 2020-07-14 DIAGNOSIS — E1169 Type 2 diabetes mellitus with other specified complication: Secondary | ICD-10-CM | POA: Diagnosis not present

## 2020-07-14 DIAGNOSIS — Z1159 Encounter for screening for other viral diseases: Secondary | ICD-10-CM

## 2020-07-14 DIAGNOSIS — E118 Type 2 diabetes mellitus with unspecified complications: Secondary | ICD-10-CM

## 2020-07-14 DIAGNOSIS — R69 Illness, unspecified: Secondary | ICD-10-CM | POA: Diagnosis not present

## 2020-07-14 LAB — POCT URINALYSIS DIPSTICK
Bilirubin, UA: NEGATIVE
Blood, UA: NEGATIVE
Glucose, UA: NEGATIVE
Ketones, UA: NEGATIVE
Leukocytes, UA: NEGATIVE
Nitrite, UA: NEGATIVE
Protein, UA: POSITIVE — AB
Spec Grav, UA: 1.015 (ref 1.010–1.025)
Urobilinogen, UA: 0.2 E.U./dL
pH, UA: 6 (ref 5.0–8.0)

## 2020-07-14 LAB — HM DIABETES EYE EXAM

## 2020-07-15 LAB — COMPREHENSIVE METABOLIC PANEL
ALT: 43 IU/L — ABNORMAL HIGH (ref 0–32)
AST: 38 IU/L (ref 0–40)
Albumin/Globulin Ratio: 1.5 (ref 1.2–2.2)
Albumin: 4 g/dL (ref 3.7–4.7)
Alkaline Phosphatase: 83 IU/L (ref 48–121)
BUN/Creatinine Ratio: 22 (ref 12–28)
BUN: 17 mg/dL (ref 8–27)
Bilirubin Total: 0.4 mg/dL (ref 0.0–1.2)
CO2: 27 mmol/L (ref 20–29)
Calcium: 9.2 mg/dL (ref 8.7–10.3)
Chloride: 97 mmol/L (ref 96–106)
Creatinine, Ser: 0.79 mg/dL (ref 0.57–1.00)
GFR calc Af Amer: 84 mL/min/{1.73_m2} (ref >59)
GFR calc non Af Amer: 72 mL/min/{1.73_m2} (ref >59)
Globulin, Total: 2.6 g/dL (ref 1.5–4.5)
Glucose: 216 mg/dL — ABNORMAL HIGH (ref 65–99)
Potassium: 3.6 mmol/L (ref 3.5–5.2)
Sodium: 139 mmol/L (ref 134–144)
Total Protein: 6.6 g/dL (ref 6.0–8.5)

## 2020-07-15 LAB — CBC WITH DIFFERENTIAL/PLATELET
Basophils Absolute: 0.1 10*3/uL (ref 0.0–0.2)
Basos: 1 %
EOS (ABSOLUTE): 0.4 10*3/uL (ref 0.0–0.4)
Eos: 8 %
Hematocrit: 37.1 % (ref 34.0–46.6)
Hemoglobin: 12.7 g/dL (ref 11.1–15.9)
Immature Grans (Abs): 0 10*3/uL (ref 0.0–0.1)
Immature Granulocytes: 0 %
Lymphocytes Absolute: 1.4 10*3/uL (ref 0.7–3.1)
Lymphs: 27 %
MCH: 31.2 pg (ref 26.6–33.0)
MCHC: 34.2 g/dL (ref 31.5–35.7)
MCV: 91 fL (ref 79–97)
Monocytes Absolute: 0.4 10*3/uL (ref 0.1–0.9)
Monocytes: 8 %
Neutrophils Absolute: 3 10*3/uL (ref 1.4–7.0)
Neutrophils: 56 %
Platelets: 208 10*3/uL (ref 150–450)
RBC: 4.07 x10E6/uL (ref 3.77–5.28)
RDW: 12.6 % (ref 11.7–15.4)
WBC: 5.2 10*3/uL (ref 3.4–10.8)

## 2020-07-15 LAB — LIPID PANEL
Chol/HDL Ratio: 2.8 ratio (ref 0.0–4.4)
Cholesterol, Total: 165 mg/dL (ref 100–199)
HDL: 59 mg/dL (ref >39)
LDL Chol Calc (NIH): 84 mg/dL (ref 0–99)
Triglycerides: 126 mg/dL (ref 0–149)
VLDL Cholesterol Cal: 22 mg/dL (ref 5–40)

## 2020-07-15 LAB — TSH: TSH: 5.1 u[IU]/mL — ABNORMAL HIGH (ref 0.450–4.500)

## 2020-07-15 LAB — HEMOGLOBIN A1C
Est. average glucose Bld gHb Est-mCnc: 180 mg/dL
Hgb A1c MFr Bld: 7.9 % — ABNORMAL HIGH (ref 4.8–5.6)

## 2020-07-15 LAB — HEPATITIS C ANTIBODY: Hep C Virus Ab: <0.1 s/co ratio (ref 0.0–0.9)

## 2020-07-16 ENCOUNTER — Encounter: Payer: Self-pay | Admitting: Internal Medicine

## 2020-07-31 ENCOUNTER — Other Ambulatory Visit: Payer: Self-pay | Admitting: Internal Medicine

## 2020-08-06 DIAGNOSIS — I350 Nonrheumatic aortic (valve) stenosis: Secondary | ICD-10-CM | POA: Diagnosis not present

## 2020-08-06 DIAGNOSIS — E782 Mixed hyperlipidemia: Secondary | ICD-10-CM | POA: Diagnosis not present

## 2020-08-06 DIAGNOSIS — I6523 Occlusion and stenosis of bilateral carotid arteries: Secondary | ICD-10-CM | POA: Diagnosis not present

## 2020-08-06 DIAGNOSIS — R0600 Dyspnea, unspecified: Secondary | ICD-10-CM | POA: Diagnosis not present

## 2020-08-06 DIAGNOSIS — I10 Essential (primary) hypertension: Secondary | ICD-10-CM | POA: Diagnosis not present

## 2020-08-16 ENCOUNTER — Other Ambulatory Visit: Payer: Self-pay | Admitting: Internal Medicine

## 2020-08-16 DIAGNOSIS — E119 Type 2 diabetes mellitus without complications: Secondary | ICD-10-CM

## 2020-08-16 NOTE — Telephone Encounter (Signed)
Requested Prescriptions  Pending Prescriptions Disp Refills  . JANUVIA 100 MG tablet [Pharmacy Med Name: JANUVIA 100 MG TABLET] 90 tablet 0    Sig: TAKE 1 TABLET BY MOUTH EVERY DAY     Endocrinology:  Diabetes - DPP-4 Inhibitors Passed - 08/16/2020 11:31 AM      Passed - HBA1C is between 0 and 7.9 and within 180 days    Hgb A1c MFr Bld  Date Value Ref Range Status  07/14/2020 7.9 (H) 4.8 - 5.6 % Final    Comment:             Prediabetes: 5.7 - 6.4          Diabetes: >6.4          Glycemic control for adults with diabetes: <7.0          Passed - Cr in normal range and within 360 days    Creatinine, Ser  Date Value Ref Range Status  07/14/2020 0.79 0.57 - 1.00 mg/dL Final         Passed - Valid encounter within last 6 months    Recent Outpatient Visits          1 month ago Annual physical exam   [facility] Glean Hess, MD   5 months ago Trochanteric bursitis of left hip   [facility] Glean Hess, MD   10 months ago Essential hypertension   [place], Laura H, MD   1 year ago Generalized abdominal pain   [facility] Glean Hess, MD   1 year ago DM type 2 without retinopathy [facility])   [place], Laura H, MD      Future Appointments            In 3 months Army Melia Jesse Sans, MD [facility], [facility]

## 2020-09-16 ENCOUNTER — Encounter: Payer: Self-pay | Admitting: Internal Medicine

## 2020-09-16 DIAGNOSIS — S46011A Strain of muscle(s) and tendon(s) of the rotator cuff of right shoulder, initial encounter: Secondary | ICD-10-CM | POA: Diagnosis not present

## 2020-09-17 ENCOUNTER — Ambulatory Visit: Payer: Medicare HMO | Admitting: Internal Medicine

## 2020-09-18 ENCOUNTER — Encounter: Payer: Self-pay | Admitting: Internal Medicine

## 2020-09-23 DIAGNOSIS — M25511 Pain in right shoulder: Secondary | ICD-10-CM | POA: Diagnosis not present

## 2020-09-28 DIAGNOSIS — K219 Gastro-esophageal reflux disease without esophagitis: Secondary | ICD-10-CM | POA: Diagnosis not present

## 2020-09-28 DIAGNOSIS — H90A22 Sensorineural hearing loss, unilateral, left ear, with restricted hearing on the contralateral side: Secondary | ICD-10-CM | POA: Diagnosis not present

## 2020-09-30 ENCOUNTER — Other Ambulatory Visit: Payer: Self-pay | Admitting: Otolaryngology

## 2020-09-30 ENCOUNTER — Other Ambulatory Visit (HOSPITAL_COMMUNITY): Payer: Self-pay | Admitting: Otolaryngology

## 2020-09-30 DIAGNOSIS — H9192 Unspecified hearing loss, left ear: Secondary | ICD-10-CM

## 2020-09-30 DIAGNOSIS — M25511 Pain in right shoulder: Secondary | ICD-10-CM | POA: Diagnosis not present

## 2020-10-12 DIAGNOSIS — S46011A Strain of muscle(s) and tendon(s) of the rotator cuff of right shoulder, initial encounter: Secondary | ICD-10-CM | POA: Diagnosis not present

## 2020-10-15 ENCOUNTER — Other Ambulatory Visit: Payer: Self-pay

## 2020-10-15 ENCOUNTER — Ambulatory Visit
Admission: RE | Admit: 2020-10-15 | Discharge: 2020-10-15 | Disposition: A | Discharge status: Home / Self Care | Payer: Medicare HMO | Source: Ambulatory Visit | Attending: Otolaryngology | Admitting: Otolaryngology

## 2020-10-15 DIAGNOSIS — H9192 Unspecified hearing loss, left ear: Secondary | ICD-10-CM | POA: Diagnosis not present

## 2020-10-15 DIAGNOSIS — G319 Degenerative disease of nervous system, unspecified: Secondary | ICD-10-CM | POA: Diagnosis not present

## 2020-10-15 DIAGNOSIS — G9389 Other specified disorders of brain: Secondary | ICD-10-CM | POA: Diagnosis not present

## 2020-10-15 DIAGNOSIS — I6782 Cerebral ischemia: Secondary | ICD-10-CM | POA: Diagnosis not present

## 2020-10-15 MED ORDER — GADOBUTROL 1 MMOL/ML IV SOLN
7.5000 mL | Freq: Once | INTRAVENOUS | Status: AC | PRN
  Administered 2020-10-15: 7.5 mL via INTRAVENOUS

## 2020-10-19 ENCOUNTER — Telehealth: Payer: Self-pay

## 2020-10-19 DIAGNOSIS — M25511 Pain in right shoulder: Secondary | ICD-10-CM | POA: Diagnosis not present

## 2020-10-19 NOTE — Chronic Care Management (AMB) (Signed)
  Chronic Care Management   Note  10/19/2020 Name: Samantha Lang MRN: [id number] DOB: [birth year]  Samantha Lang is a 80 y.o. year old female who is a primary care patient of Glean Hess, MD. I reached out to Haze Boyden by phone today in response to a referral sent by Samantha Lang's health plan.     [name] was given information about Chronic Care Management services today including:  1. CCM service includes personalized support from designated clinical staff supervised by her physician, including individualized plan of care and coordination with other care providers 2. 24/7 contact phone numbers for assistance for urgent and routine care needs. 3. Service will only be billed when office clinical staff spend 20 minutes or more in a month to coordinate care. 4. Only one practitioner may furnish and bill the service in a calendar month. 5. The patient may stop CCM services at any time (effective at the end of the month) by phone call to the office staff. 6. The patient will be responsible for cost sharing (co-pay) of up to 20% of the service fee (after annual deductible is met).  Patient did not agree to enrollment in care management services and does not wish to consider at this time.  Follow up plan: The patient has been provided with contact information for the care management team and has been advised to call with any health related questions or concerns.   Noreene Larsson, [place], [place], [place] [address] Direct Dial: [phone] [email or web address]: [email or web address]

## 2020-10-26 DIAGNOSIS — M25511 Pain in right shoulder: Secondary | ICD-10-CM | POA: Diagnosis not present

## 2020-10-28 ENCOUNTER — Emergency Department: Payer: Medicare HMO

## 2020-10-28 ENCOUNTER — Emergency Department
Admission: EM | Admit: 2020-10-28 | Discharge: 2020-10-28 | Disposition: A | Discharge status: Home / Self Care | Payer: Medicare HMO | Attending: Emergency Medicine | Admitting: Emergency Medicine

## 2020-10-28 ENCOUNTER — Other Ambulatory Visit: Payer: Self-pay

## 2020-10-28 DIAGNOSIS — M79621 Pain in right upper arm: Secondary | ICD-10-CM | POA: Diagnosis not present

## 2020-10-28 DIAGNOSIS — Z23 Encounter for immunization: Secondary | ICD-10-CM | POA: Diagnosis not present

## 2020-10-28 DIAGNOSIS — E119 Type 2 diabetes mellitus without complications: Secondary | ICD-10-CM | POA: Diagnosis not present

## 2020-10-28 DIAGNOSIS — Z7982 Long term (current) use of aspirin: Secondary | ICD-10-CM | POA: Diagnosis not present

## 2020-10-28 DIAGNOSIS — S0181XA Laceration without foreign body of other part of head, initial encounter: Secondary | ICD-10-CM | POA: Insufficient documentation

## 2020-10-28 DIAGNOSIS — W19XXXA Unspecified fall, initial encounter: Secondary | ICD-10-CM | POA: Diagnosis not present

## 2020-10-28 DIAGNOSIS — Z87891 Personal history of nicotine dependence: Secondary | ICD-10-CM | POA: Insufficient documentation

## 2020-10-28 DIAGNOSIS — Y92009 Unspecified place in unspecified non-institutional (private) residence as the place of occurrence of the external cause: Secondary | ICD-10-CM | POA: Diagnosis not present

## 2020-10-28 DIAGNOSIS — I10 Essential (primary) hypertension: Secondary | ICD-10-CM | POA: Diagnosis not present

## 2020-10-28 DIAGNOSIS — Z79899 Other long term (current) drug therapy: Secondary | ICD-10-CM | POA: Insufficient documentation

## 2020-10-28 DIAGNOSIS — W010XXA Fall on same level from slipping, tripping and stumbling without subsequent striking against object, initial encounter: Secondary | ICD-10-CM | POA: Diagnosis not present

## 2020-10-28 DIAGNOSIS — R911 Solitary pulmonary nodule: Secondary | ICD-10-CM | POA: Insufficient documentation

## 2020-10-28 DIAGNOSIS — M79601 Pain in right arm: Secondary | ICD-10-CM | POA: Insufficient documentation

## 2020-10-28 DIAGNOSIS — S0990XA Unspecified injury of head, initial encounter: Secondary | ICD-10-CM

## 2020-10-28 DIAGNOSIS — S199XXA Unspecified injury of neck, initial encounter: Secondary | ICD-10-CM | POA: Diagnosis not present

## 2020-10-28 DIAGNOSIS — M19011 Primary osteoarthritis, right shoulder: Secondary | ICD-10-CM | POA: Diagnosis not present

## 2020-10-28 DIAGNOSIS — R58 Hemorrhage, not elsewhere classified: Secondary | ICD-10-CM | POA: Diagnosis not present

## 2020-10-28 MED ORDER — LIDOCAINE-EPINEPHRINE (PF) 1 %-1:200000 IJ SOLN
10.0000 mL | Freq: Once | INTRAMUSCULAR | Status: DC
  Filled 2020-10-28: qty 10

## 2020-10-28 MED ORDER — TETANUS-DIPHTH-ACELL PERTUSSIS 5-2.5-18.5 LF-MCG/0.5 IM SUSY
0.5000 mL | PREFILLED_SYRINGE | Freq: Once | INTRAMUSCULAR | Status: AC
  Administered 2020-10-28: 0.5 mL via INTRAMUSCULAR
  Filled 2020-10-28: qty 0.5

## 2020-10-28 NOTE — Discharge Instructions (Signed)
Please follow up with primary care regarding the pulmonary nodule. Radiology recommends repeat CT in 1 year to make sure there are no changes.  Sutures should be removed in 4-5 days.  Return to the ER for symptoms of concern unless able to see primary care.

## 2020-10-28 NOTE — ED Notes (Signed)
Pt here for mechanical fall. Pt has laceration noted to right forehead with evidence of swelling and dried blood present.

## 2020-10-28 NOTE — ED Triage Notes (Signed)
Pt comes into the ED via [facility] from home c/o fall.  Pt tripped over her dogs.  Pt has laceration to the right side of forehead with bleeding under control.  BP 209/123, 72HR, 97%.  No blood thinner and denies any LOC.

## 2020-10-30 ENCOUNTER — Telehealth: Payer: Self-pay

## 2020-10-30 NOTE — Telephone Encounter (Signed)
Copied from [place] [id number]. Topic: General - Other >> Oct 30, 2020  8:51 AM Samantha Lang wrote: Reason for CRM: Pt called and is requesting to speak with [name] regarding her stitches. Please advise.

## 2020-10-30 NOTE — Telephone Encounter (Signed)
Called pt told her that she would need to go to UC to get her stitches taken out. Pt verbalize understanding.  KP

## 2020-11-02 ENCOUNTER — Other Ambulatory Visit: Payer: Self-pay

## 2020-11-02 ENCOUNTER — Encounter: Payer: Self-pay | Admitting: Internal Medicine

## 2020-11-02 ENCOUNTER — Encounter: Payer: Self-pay | Admitting: Emergency Medicine

## 2020-11-02 ENCOUNTER — Ambulatory Visit
Admission: EM | Admit: 2020-11-02 | Discharge: 2020-11-02 | Disposition: A | Discharge status: Home / Self Care | Payer: Medicare HMO | Attending: Emergency Medicine | Admitting: Emergency Medicine

## 2020-11-02 DIAGNOSIS — Z4802 Encounter for removal of sutures: Secondary | ICD-10-CM

## 2020-11-02 DIAGNOSIS — S0181XD Laceration without foreign body of other part of head, subsequent encounter: Secondary | ICD-10-CM | POA: Diagnosis not present

## 2020-11-02 DIAGNOSIS — S0990XD Unspecified injury of head, subsequent encounter: Secondary | ICD-10-CM | POA: Diagnosis not present

## 2020-11-02 DIAGNOSIS — R739 Hyperglycemia, unspecified: Secondary | ICD-10-CM | POA: Insufficient documentation

## 2020-11-02 LAB — GLUCOSE, CAPILLARY: Glucose-Capillary: 213 mg/dL — ABNORMAL HIGH (ref 70–99)

## 2020-11-02 NOTE — ED Provider Notes (Signed)
[facility] Emergency Department Provider Note ____________________________________________   First MD Initiated Contact with Patient 10/28/20 1308     (approximate)  I have reviewed the triage vital signs and the nursing notes.   HISTORY  Chief Complaint Laceration  HPI Samantha Lang is a 79 y.o. female with history of diabetes, hyperlipidemia, hypertension and other chronic medical history as listed below presents to the emergency department for treatment and evaluation after mechanical, nonsyncopal fall at home.  She tripped over her dogs and hit her head.  She is not currently on any blood thinners.  She does have right arm pain in addition to a laceration overlying the right forehead.  Tdap is not current..         Past Medical History:  Diagnosis Date  . Allergy   . Anxiety   . Aortic valve stenosis, mild   . Depression    mild  . Diabetes mellitus age 33  . GERD (gastroesophageal reflux disease)   . Heart murmur   . Hemorrhoids   . Hyperlipidemia   . Hypertension 2003  . Incontinence    Female stress  . Mitral incompetence   . Personal history of chemotherapy    3 treatments  . Personal history of radiation therapy    5 treatments  . Postmenopausal atrophic vaginitis   . Uterine cancer [facility])    treated    Patient Active Problem List   Diagnosis Date Noted  . 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 LVH (left ventricular hypertrophy) due to hypertensive disease, without heart failure 04/11/2019  . Foot pain, right 10/10/2018  . Ganglion cyst of left foot 08/29/2018  . Xerosis of skin 08/29/2018  . Palpitations 08/08/2018  . Colon cancer screening   . Trochanteric bursitis of left hip 12/07/2016  . History of endometrial cancer 08/03/2016  . Edema leg 04/13/2016  . Type II diabetes mellitus with complication ([place]) 71/05/2693  . Shoulder pain, left 02/12/2016  . Acid reflux 10/02/2015  . 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 Hyperlipidemia associated with type 2 diabetes mellitus ([place]) 04/08/2015  . Aortic heart valve narrowing 03/26/2015  . Bilateral carotid artery stenosis 03/26/2015  . Anxiety   . Environmental and seasonal allergies   . Postmenopausal atrophic vaginitis   . Depression, major, recurrent, moderate ([place])   . 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 ([place])   . Essential hypertension     Past Surgical History:  Procedure Laterality Date  . ABDOMINAL HYSTERECTOMY  07/2016  . BREAST BIOPSY Right 03/21/08   right, benign   . COLONOSCOPY WITH PROPOFOL N/A 09/22/2017   Procedure: COLONOSCOPY WITH PROPOFOL;  Surgeon: Lucilla Lame, MD;  Location: [place];  Service: Gastroenterology;  Laterality: N/A;  diabetic  . EYE SURGERY  Oct 2009   for ptosis,  Dr. Rosaria Ferries, eyelid lift  . POLYPECTOMY  09/22/2017   Procedure: POLYPECTOMY;  Surgeon: Lucilla Lame, MD;  Location: [place];  Service: Gastroenterology;;  . TUBAL LIGATION      Prior to Admission medications   Medication Sig Start Date End Date Taking? Authorizing Provider  allopurinol (ZYLOPRIM) 100 MG tablet TAKE 1 TABLET BY MOUTH EVERY DAY 03/06/20   Glean Hess, MD  amLODipine (NORVASC) 5 MG tablet Take 1 tablet (5 mg total) by mouth 2 (two) times daily. 03/06/20   Glean Hess, MD  aspirin 81 MG tablet Take 81 mg by mouth  daily.    [provider]  atorvastatin (LIPITOR) 10 MG tablet Take 1 tablet by mouth daily. 07/12/15   [provider]  Calcium Carbonate-Vit D-Min (CALCIUM 1200 PO) Take by mouth.    [provider]  calcium-vitamin D (OSCAL WITH D) 500-200 MG-UNIT tablet Take 1 tablet by mouth.    [provider]  carvedilol (COREG) 3.125 MG tablet TAKE 1 TABLET(3.125 MG) BY MOUTH TWICE DAILY WITH MEALS 10/20/16   [provider]  colchicine 0.6 MG tablet Take 0.6 mg by mouth daily. PRN only, takes  rarely    [provider]  JANUVIA 100 MG tablet TAKE 1 TABLET BY MOUTH EVERY DAY 08/16/20   Glean Hess, MD  meloxicam (MOBIC) 15 MG tablet meloxicam 15 mg tablet    [provider]  methocarbamol (ROBAXIN) 500 MG tablet Take 500 mg by mouth 3 (three) times daily. 09/23/20   [provider]  Multiple Vitamin (MULTIVITAMIN) capsule Take 1 capsule by mouth daily.    [provider]  pantoprazole (PROTONIX) 40 MG tablet TAKE 1 TABLET BY MOUTH EVERY DAY 03/06/20   Glean Hess, MD  psyllium (METAMUCIL SMOOTH TEXTURE) 58.6 % powder Please use one does every other day. 10/17/19   Virgel Manifold, MD  telmisartan-hydrochlorothiazide (MICARDIS HCT) 80-25 MG tablet Take 1 tablet by mouth daily. 06/08/17   [provider]  venlafaxine XR (EFFEXOR-XR) 150 MG 24 hr capsule TAKE (1) CAPSULE BY MOUTH EVERY DAY 07/31/20   Glean Hess, MD    Allergies Lipitor [atorvastatin calcium], Cephalexin, and Clarithromycin  Family History  Problem Relation Age of Onset  . Breast cancer Paternal Aunt   . Breast cancer Paternal Grandmother 33  . Cancer Father 39       lung  . Stroke Mother   . Hypertension Mother   . Cancer Brother        prostate  . Depression Brother   . Ovarian cancer Neg Hx   . Colon cancer Neg Hx   . Diabetes Neg Hx     Social History Social History   Tobacco Use  . Smoking status: Former Smoker    Packs/day: 0.25    Years: 20.00    Pack years: 5.00    Types: Cigarettes    Quit date: 09/25/1988    Years since quitting: 32.1  . Smokeless tobacco: Never Used  Vaping Use  . Vaping Use: Never used  Substance Use Topics  . Alcohol use: Yes    Alcohol/week: 6.0 standard drinks    Types: 2 Glasses of wine, 2 Cans of beer, 2 Shots of liquor per week  . Drug use: No    Review of Systems  Constitutional: No fever/chills Eyes: No visual changes. ENT: No sore throat. Cardiovascular: Denies chest pain. Respiratory:  Denies shortness of breath. Gastrointestinal: No abdominal pain.  No nausea, no vomiting.  No diarrhea.  No constipation. Genitourinary: Negative for dysuria. Musculoskeletal: Positive for right arm pain. Skin: Negative for rash. Neurological: Negative for headaches, focal weakness or numbness.  ____________________________________________   PHYSICAL EXAM:  VITAL SIGNS: ED Triage Vitals [10/28/20 1304]  Enc Vitals Group     BP (!) 166/60     Pulse Rate 68     Resp 18     Temp 98 F (36.7 C)     Temp Source Oral     SpO2 98 %     Weight 170 lb (77.1 kg)     Height 5'  2" (1.575 m)     Head Circumference      Peak Flow      Pain Score 5     Pain Loc      Pain Edu?      Excl. in [place]?     Constitutional: Alert and oriented. Well appearing and in no acute distress. Eyes: Conjunctivae are normal. PERRL. EOMI. Head: Atraumatic. Nose: No congestion/rhinnorhea. Mouth/Throat: Mucous membranes are moist.  Oropharynx non-erythematous. Neck: No stridor.   Hematological/Lymphatic/Immunilogical: No cervical lymphadenopathy. Cardiovascular: Normal rate, regular rhythm. Grossly normal heart sounds.  Good peripheral circulation. Respiratory: Normal respiratory effort.  No retractions. Lungs CTAB. Gastrointestinal: Soft and nontender. No distention. No abdominal bruits. No CVA tenderness. Genitourinary:  Musculoskeletal: Tenderness over the proximal humerus without obvious deformity. Neurologic:  Normal speech and language. No gross focal neurologic deficits are appreciated. No gait instability.  Alert and oriented Skin: 3 cm laceration overlying the right side of the forehead.  Bleeding well controlled. Psychiatric: Mood and affect are normal. Speech and behavior are normal.  ____________________________________________   LABS (all labs ordered are listed, but only abnormal results are displayed)  Labs Reviewed - No data to  display ____________________________________________  EKG  Not indicated ____________________________________________  RADIOLOGY  ED MD interpretation:    CT head and cervical spine negative for acute findings.  Image of the right humerus is also negative for acute bony abnormalities.  I, Sherrie George, personally viewed and evaluated these images (plain radiographs) as part of my medical decision making, as well as reviewing the written report by the radiologist.  Official radiology report(s): No results found.  ____________________________________________   PROCEDURES  Procedure(s) performed (including Critical Care):  Marland KitchenMarland KitchenLaceration Repair  Date/Time: 11/02/2020 4:36 PM Performed by: Victorino Dike, FNP Authorized by: Victorino Dike, FNP   Consent:    Consent obtained:  Verbal   Consent given by:  Patient   Risks discussed:  Pain, poor cosmetic result and poor wound healing Anesthesia (see MAR for exact dosages):    Anesthesia method:  Local infiltration   Local anesthetic:  Lidocaine 1% WITH epi Laceration details:    Location:  Face   Face location:  Forehead   Length (cm):  3 Repair type:    Repair type:  Simple Pre-procedure details:    Preparation:  Patient was prepped and draped in usual sterile fashion Treatment:    Area cleansed with:  Betadine and saline Skin repair:    Repair method:  Sutures   Suture size:  6-0   Suture material:  Prolene   Suture technique:  Simple interrupted   Number of sutures:  5 Approximation:    Approximation:  Close Post-procedure details:    Dressing:  Open (no dressing)   ____________________________________________   INITIAL IMPRESSION / ASSESSMENT AND PLAN     79 year old female presenting to the emergency department after mechanical, nonsyncopal fall.  See HPI for further details.  We will get a head CT, cervical spine CT and image of the right humerus.  Patient states that she does have a right rotator  cuff injury and is not sure if she is hurting any worse now than she does normally.  She did stretch out that right arm to try and catch her fall.  DIFFERENTIAL DIAGNOSIS  Proximal humerus fracture, head injury, minor head injury, forehead laceration  ED COURSE  CT of the head and cervical spine are negative for acute findings.  Image of the right humerus is also negative for  acute concerns.  Sutures inserted by Marylyn Ishihara, PA-S with direct observation of myself.  Patient to be discharged home with wound care instructions.  She will have the sutures removed by primary care in about 5 days.  She is to return to the emergency department for any symptom of concern if she is unable to see primary care.  Also of note, she was advised of the incidental finding of the pulmonary nodule and will mention it to primary care at her next visit.   ___________________________________________   FINAL CLINICAL IMPRESSION(S) / ED DIAGNOSES  Final diagnoses:  Minor head injury, initial encounter  Laceration of forehead, initial encounter  Right arm pain  Incidental pulmonary nodule, > 73mm and < 82mm     ED Discharge Orders    None       Samantha Lang was evaluated in Emergency Department on 11/02/2020 for the symptoms described in the history of present illness. She was evaluated in the context of the global COVID-19 pandemic, which necessitated consideration that the patient might be at risk for infection with the SARS-CoV-2 virus that causes COVID-19. Institutional protocols and algorithms that pertain to the evaluation of patients at risk for COVID-19 are in a state of rapid change based on information released by regulatory bodies including the CDC and federal and state organizations. These policies and algorithms were followed during the patient's care in the ED.   Note:  This document was prepared using Dragon voice recognition software and may include unintentional dictation errors.   Victorino Dike, FNP 11/02/20 1643    Merlyn Lot, MD 11/02/20 [phone]

## 2020-11-02 NOTE — ED Triage Notes (Signed)
Pt presents to [facility] for suture removal. She fell on 10/28/20. She states she has 5 however I do not see a note from ED stating how many were placed. Pt also wanting her blood sugar checked today.

## 2020-11-02 NOTE — ED Provider Notes (Signed)
HPI  SUBJECTIVE:  Samantha Lang is a 79 y.o. female who presents for suture removal. Patient was seen in the ED on 11/3 status post fall, was found to have a minor head injury and forehead laceration.  Unable to find the number of sutures placed in ED note.  Patient states 5.  States that it is healing well.  No erythema, purulent drainage.  States that the tenderness is improving.  She would also like her glucose checked.  States that she has not been eating properly or checking her glucose on a regular basis for the past few months.  She states that she is compliant with her Januvia.  She has a past medical history of diabetes, hypertension.  OAC:ZYSAYTKZ, Jesse Sans, MD   Past Medical History:  Diagnosis Date  . Allergy   . Anxiety   . Aortic valve stenosis, mild   . Depression    mild  . Diabetes mellitus age 67  . GERD (gastroesophageal reflux disease)   . Heart murmur   . Hemorrhoids   . Hyperlipidemia   . Hypertension 2003  . Incontinence    Female stress  . Mitral incompetence   . Personal history of chemotherapy    3 treatments  . Personal history of radiation therapy    5 treatments  . Postmenopausal atrophic vaginitis   . Uterine cancer [facility])    treated    Past Surgical History:  Procedure Laterality Date  . ABDOMINAL HYSTERECTOMY  07/2016  . BREAST BIOPSY Right 03/21/08   right, benign   . COLONOSCOPY WITH PROPOFOL N/A 09/22/2017   Procedure: COLONOSCOPY WITH PROPOFOL;  Surgeon: Lucilla Lame, MD;  Location: [facility];  Service: Gastroenterology;  Laterality: N/A;  diabetic  . EYE SURGERY  Oct 2009   for ptosis,  Dr. Rosaria Ferries, eyelid lift  . POLYPECTOMY  09/22/2017   Procedure: POLYPECTOMY;  Surgeon: Lucilla Lame, MD;  Location: [place];  Service: Gastroenterology;;  . TUBAL LIGATION      Family History  Problem Relation Age of Onset  . Breast cancer Paternal Aunt   . Breast cancer Paternal Grandmother 88  . Cancer Father 78       lung   . Stroke Mother   . Hypertension Mother   . Cancer Brother        prostate  . Depression Brother   . Ovarian cancer Neg Hx   . Colon cancer Neg Hx   . Diabetes Neg Hx     Social History   Tobacco Use  . Smoking status: Former Smoker    Packs/day: 0.25    Years: 20.00    Pack years: 5.00    Types: Cigarettes    Quit date: 09/25/1988    Years since quitting: 32.1  . Smokeless tobacco: Never Used  Vaping Use  . Vaping Use: Never used  Substance Use Topics  . Alcohol use: Yes    Alcohol/week: 6.0 standard drinks    Types: 2 Glasses of wine, 2 Cans of beer, 2 Shots of liquor per week  . Drug use: No    No current facility-administered medications for this encounter.  Current Outpatient Medications:  .  amLODipine (NORVASC) 5 MG tablet, Take 1 tablet (5 mg total) by mouth 2 (two) times daily., Disp: 180 tablet, Rfl: 3 .  aspirin 81 MG tablet, Take 81 mg by mouth daily., Disp: , Rfl:  .  atorvastatin (LIPITOR) 10 MG tablet, Take 1 tablet by mouth daily., Disp: ,  Rfl: 5 .  carvedilol (COREG) 3.125 MG tablet, TAKE 1 TABLET(3.125 MG) BY MOUTH TWICE DAILY WITH MEALS, Disp: , Rfl:  .  colchicine 0.6 MG tablet, Take 0.6 mg by mouth daily. PRN only, takes rarely, Disp: , Rfl:  .  JANUVIA 100 MG tablet, TAKE 1 TABLET BY MOUTH EVERY DAY, Disp: 90 tablet, Rfl: 0 .  Multiple Vitamin (MULTIVITAMIN) capsule, Take 1 capsule by mouth daily., Disp: , Rfl:  .  pantoprazole (PROTONIX) 40 MG tablet, TAKE 1 TABLET BY MOUTH EVERY DAY, Disp: 90 tablet, Rfl: 1 .  psyllium (METAMUCIL SMOOTH TEXTURE) 58.6 % powder, Please use one does every other day., Disp: 283 g, Rfl: 1 .  telmisartan-hydrochlorothiazide (MICARDIS HCT) 80-25 MG tablet, Take 1 tablet by mouth daily., Disp: , Rfl: 3 .  venlafaxine XR (EFFEXOR-XR) 150 MG 24 hr capsule, TAKE (1) CAPSULE BY MOUTH EVERY DAY, Disp: 90 capsule, Rfl: 1 .  allopurinol (ZYLOPRIM) 100 MG tablet, TAKE 1 TABLET BY MOUTH EVERY DAY, Disp: 90 tablet, Rfl: 1 .   Calcium Carbonate-Vit D-Min (CALCIUM 1200 PO), Take by mouth., Disp: , Rfl:  .  calcium-vitamin D (OSCAL WITH D) 500-200 MG-UNIT tablet, Take 1 tablet by mouth., Disp: , Rfl:  .  meloxicam (MOBIC) 15 MG tablet, meloxicam 15 mg tablet, Disp: , Rfl:  .  methocarbamol (ROBAXIN) 500 MG tablet, Take 500 mg by mouth 3 (three) times daily., Disp: , Rfl:   Allergies  Allergen Reactions  . Lipitor [Atorvastatin Calcium]     Legs ache  With higher dose  . Cephalexin Rash  . Clarithromycin Rash     ROS  As noted in HPI.   Physical Exam  BP (!) 169/50 (BP Location: Right Arm)   Pulse 70   Temp 98 F (36.7 C) (Oral)   Resp 18   Ht 5\' 2"  (1.575 m)   Wt 77.1 kg   SpO2 100%   BMI 31.09 kg/m   Constitutional: Well developed, well nourished, no acute distress Eyes:  EOMI, conjunctiva normal bilaterally HENT: Normocephalic, atraumatic,mucus membranes moist Respiratory: Normal inspiratory effort Cardiovascular: Normal rate GI: nondistended skin: Well-healing nontender laceration right forehead with 5 sutures intact.  Mild surrounding erythema. No expressible purulent drainage.    Musculoskeletal: no deformities Neurologic: Alert & oriented x 3, no focal neuro deficits Psychiatric: Speech and behavior appropriate   ED Course   Medications - No data to display  Orders Placed This Encounter  Procedures  . Glucose, capillary    Standing Status:   Standing    Number of Occurrences:   1    Results for orders placed or performed during the hospital encounter of 11/02/20 (from the past 24 hour(s))  Glucose, capillary     Status: Abnormal   Collection Time: 11/02/20  9:37 AM  Result Value Ref Range   Glucose-Capillary 213 (H) 70 - 99 mg/dL   No results found.  ED Clinical Impression  1. Minor head injury, subsequent encounter   2. Laceration of forehead, subsequent encounter   3. Visit for suture removal   4. Hyperglycemia      ED Assessment/Plan  Reviewed previous  charts, labs.  As noted in HPI.  1.  Laceration of forehead.  Appears to be healing well.  No signs of infection.  Procedure note: Cleaned area with alcohol, removed 5 Prolene sutures.  Patient tolerated procedure well.  Keep wound clean and dry, advised Mederma to minimize scar formation  2.  Hyperglycemia. Glucose today 213.  Last  glucose on 07/14/2020 was 216.  Suspect that she will need to change her medication regimen.  Diet, exercise, keep a log of her blood sugars.  She is to follow-up with her primary care physician for this.  Discussed labs,MDM, treatment plan, and plan for follow-up with patient.  patient agrees with plan.   No orders of the defined types were placed in this encounter.   *This clinic note was created using Dragon dictation software. Therefore, there may be occasional mistakes despite careful proofreading.   ?    Melynda Ripple, MD 11/02/20 1035

## 2020-11-02 NOTE — Discharge Instructions (Addendum)
Keep the laceration clean.  When the scab falls off, you can use Mederma cream, which you can find at the drugstore or grocery store, but to help minimize the scar.  Diet and exercise will help control your blood sugar, however you may need to have your medication adjusted.  Check your sugar twice a day and keep a log of this.

## 2020-11-03 NOTE — Telephone Encounter (Signed)
Please Advise.  KP

## 2020-11-04 DIAGNOSIS — M25511 Pain in right shoulder: Secondary | ICD-10-CM | POA: Diagnosis not present

## 2020-11-06 DIAGNOSIS — M25511 Pain in right shoulder: Secondary | ICD-10-CM | POA: Diagnosis not present

## 2020-11-11 DIAGNOSIS — M25511 Pain in right shoulder: Secondary | ICD-10-CM | POA: Diagnosis not present

## 2020-11-13 DIAGNOSIS — M25511 Pain in right shoulder: Secondary | ICD-10-CM | POA: Diagnosis not present

## 2020-11-17 ENCOUNTER — Encounter: Payer: Self-pay | Admitting: Internal Medicine

## 2020-11-23 ENCOUNTER — Encounter: Payer: Self-pay | Admitting: Internal Medicine

## 2020-11-23 ENCOUNTER — Ambulatory Visit (INDEPENDENT_AMBULATORY_CARE_PROVIDER_SITE_OTHER): Payer: Medicare HMO | Admitting: Internal Medicine

## 2020-11-23 ENCOUNTER — Other Ambulatory Visit: Payer: Self-pay

## 2020-11-23 VITALS — BP 139/60 | HR 75 | Temp 99.0°F | Ht 62.0 in | Wt 172.0 lb

## 2020-11-23 DIAGNOSIS — M12811 Other specific arthropathies, not elsewhere classified, right shoulder: Secondary | ICD-10-CM

## 2020-11-23 DIAGNOSIS — I10 Essential (primary) hypertension: Secondary | ICD-10-CM | POA: Diagnosis not present

## 2020-11-23 DIAGNOSIS — E1169 Type 2 diabetes mellitus with other specified complication: Secondary | ICD-10-CM | POA: Diagnosis not present

## 2020-11-23 DIAGNOSIS — E118 Type 2 diabetes mellitus with unspecified complications: Secondary | ICD-10-CM

## 2020-11-23 DIAGNOSIS — M75101 Unspecified rotator cuff tear or rupture of right shoulder, not specified as traumatic: Secondary | ICD-10-CM | POA: Diagnosis not present

## 2020-11-23 DIAGNOSIS — E785 Hyperlipidemia, unspecified: Secondary | ICD-10-CM | POA: Diagnosis not present

## 2020-11-23 MED ORDER — BLOOD GLUCOSE MONITOR KIT: PACK | 0 refills | Status: DC

## 2020-11-23 NOTE — Progress Notes (Signed)
Date:  11/23/2020   Name:  Samantha Lang   DOB:  07/02/1941   MRN:  [id number]   Chief Complaint: Diabetes (f/u) and Hypertension  Diabetes She presents for her follow-up diabetic visit. She has type 2 diabetes mellitus. Her disease course has been worsening. Pertinent negatives for hypoglycemia include no headaches or tremors. Pertinent negatives for diabetes include no chest pain, no fatigue, no polydipsia and no polyuria. Current diabetic treatment includes oral agent (monotherapy) Celesta Gentile). She is compliant with treatment all of the time. An ACE inhibitor/angiotensin II receptor blocker is being taken.  Hypertension This is a chronic problem. The problem is resistant. Pertinent negatives include no chest pain, headaches, palpitations or shortness of breath. Past treatments include beta blockers, angiotensin blockers, diuretics and calcium channel blockers. The current treatment provides moderate improvement.  Hyperlipidemia This is a chronic problem. The problem is controlled. Pertinent negatives include no chest pain or shortness of breath. Current antihyperlipidemic treatment includes statins.  Falls - two falls in the past few months.  The first one caused a rotator cuff tear for which she is doing PT.  The second occurred when the dog leash got wrapped around her leg.  She fell onto concrete and got a forehead laceration.  Lab Results  Component Value Date   CREATININE 0.79 07/14/2020   BUN 17 07/14/2020   NA 139 07/14/2020   K 3.6 07/14/2020   CL 97 07/14/2020   CO2 27 07/14/2020   Lab Results  Component Value Date   CHOL 165 07/14/2020   HDL 59 07/14/2020   LDLCALC 84 07/14/2020   LDLDIRECT 138.3 09/26/2011   TRIG 126 07/14/2020   CHOLHDL 2.8 07/14/2020   Lab Results  Component Value Date   TSH 5.100 (H) 07/14/2020   Lab Results  Component Value Date   HGBA1C 7.9 (H) 07/14/2020   Lab Results  Component Value Date   WBC 5.2 07/14/2020   HGB 12.7 07/14/2020    HCT 37.1 07/14/2020   MCV 91 07/14/2020   PLT 208 07/14/2020   Lab Results  Component Value Date   ALT 43 (H) 07/14/2020   AST 38 07/14/2020   ALKPHOS 83 07/14/2020   BILITOT 0.4 07/14/2020     Review of Systems  Constitutional: Negative for appetite change, fatigue, fever and unexpected weight change.  HENT: Negative for tinnitus and trouble swallowing.   Eyes: Negative for visual disturbance.  Respiratory: Negative for cough, chest tightness and shortness of breath.   Cardiovascular: Negative for chest pain, palpitations and leg swelling.  Gastrointestinal: Negative for abdominal pain.  Endocrine: Negative for polydipsia and polyuria.  Genitourinary: Negative for dysuria and hematuria.  Musculoskeletal: Negative for arthralgias.  Neurological: Negative for tremors, numbness and headaches.  Psychiatric/Behavioral: Negative for dysphoric mood.    Patient Active Problem List   Diagnosis Date Noted  . 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 LVH (left ventricular hypertrophy) due to hypertensive disease, without heart failure 04/11/2019  . Foot pain, right 10/10/2018  . Ganglion cyst of left foot 08/29/2018  . Xerosis of skin 08/29/2018  . Palpitations 08/08/2018  . Colon cancer screening   . Trochanteric bursitis of left hip 12/07/2016  . History of endometrial cancer 08/03/2016  . Edema leg 04/13/2016  . Type II diabetes mellitus with complication ([place]) 31/49/7026  . Shoulder pain, left 02/12/2016  . Acid reflux 10/02/2015  . 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 Hyperlipidemia associated with type 2 diabetes mellitus ([place]) 04/08/2015  . Aortic heart valve narrowing 03/26/2015  . Bilateral carotid artery stenosis 03/26/2015  . Anxiety   . Environmental and seasonal allergies   . Postmenopausal atrophic vaginitis   . Depression, major, recurrent, moderate ([place])   .  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 Essential hypertension     Allergies  Allergen Reactions  . Atorvastatin   . Cephalexin   . Clarithromycin   . Lipitor [Atorvastatin Calcium]     Legs ache  With higher dose  . Cephalexin Rash  . Clarithromycin Rash    Past Surgical History:  Procedure Laterality Date  . ABDOMINAL HYSTERECTOMY  07/2016  . BREAST BIOPSY Right 03/21/08   right, benign   . COLONOSCOPY WITH PROPOFOL N/A 09/22/2017   Procedure: COLONOSCOPY WITH PROPOFOL;  Surgeon: Lucilla Lame, MD;  Location: [facility];  Service: Gastroenterology;  Laterality: N/A;  diabetic  . EYE SURGERY  Oct 2009   for ptosis,  Dr. Rosaria Ferries, eyelid lift  . POLYPECTOMY  09/22/2017   Procedure: POLYPECTOMY;  Surgeon: Lucilla Lame, MD;  Location: [facility];  Service: Gastroenterology;;  . TUBAL LIGATION      Social History   Tobacco Use  . Smoking status: Former Smoker    Packs/day: 0.25    Years: 20.00    Pack years: 5.00    Types: Cigarettes    Quit date: 09/25/1988    Years since quitting: 32.1  . Smokeless tobacco: Never Used  Vaping Use  . Vaping Use: Never used  Substance Use Topics  . Alcohol use: Yes    Alcohol/week: 6.0 standard drinks    Types: 2 Glasses of wine, 2 Cans of beer, 2 Shots of liquor per week  . Drug use: No     Medication list has been reviewed and updated.  Current Meds  Medication Sig  . allopurinol (ZYLOPRIM) 100 MG tablet TAKE 1 TABLET BY MOUTH EVERY DAY  . amLODipine (NORVASC) 5 MG tablet Take 1 tablet (5 mg total) by mouth 2 (two) times daily.  Marland Kitchen aspirin 81 MG tablet Take 81 mg by mouth daily.  Marland Kitchen atorvastatin (LIPITOR) 10 MG tablet Take 1 tablet by mouth daily.  . calcium-vitamin D (OSCAL WITH D) 500-200 MG-UNIT tablet Take 1 tablet by mouth.  . carvedilol (COREG) 3.125 MG tablet Takes 2 tables twice a day  . colchicine 0.6 MG tablet Take 0.6 mg by mouth daily. PRN only, takes rarely  . JANUVIA  100 MG tablet TAKE 1 TABLET BY MOUTH EVERY DAY  . meloxicam (MOBIC) 15 MG tablet meloxicam 15 mg tablet  . methocarbamol (ROBAXIN) 500 MG tablet Take 500 mg by mouth 3 (three) times daily.  . Multiple Vitamin (MULTIVITAMIN) capsule Take 1 capsule by mouth daily.  . pantoprazole (PROTONIX) 40 MG tablet TAKE 1 TABLET BY MOUTH EVERY DAY  . psyllium (METAMUCIL SMOOTH TEXTURE) 58.6 % powder Please use one does every other day.  . telmisartan-hydrochlorothiazide (MICARDIS HCT) 80-25 MG tablet Take 1 tablet by mouth daily.  Marland Kitchen tiZANidine (ZANAFLEX) 4 MG tablet as needed.  . venlafaxine XR (EFFEXOR-XR) 150 MG 24 hr capsule TAKE (1) CAPSULE BY MOUTH EVERY DAY    PHQ 2/9 Scores 11/23/2020 07/14/2020 03/06/2020 01/13/2020  PHQ - 2 Score 0 _0 PHQ- 9 Score _1 -    GAD 7 : Generalized Anxiety Score 11/23/2020 07/14/2020 03/06/2020 10/09/2019  Nervous, Anxious, on Edge 0 0 0 1  Control/stop worrying 0 1 0 2  Worry too much - different things 0 1 0 2  Trouble relaxing 0 0 0 0  Restless 0 0 0 0  Easily annoyed or irritable 0 0 0 1  Afraid - awful might happen 0 0 0 0  Total GAD 7 Score 0 2 0 6  Anxiety Difficulty - Not difficult at all - Not difficult at all    BP Readings from Last 3 Encounters:  11/23/20 139/60  11/02/20 (!) 169/50  10/28/20 (!) 166/60    Physical Exam Vitals and nursing note reviewed.  Constitutional:      General: She is not in acute distress.    Appearance: Normal appearance. She is well-developed.  HENT:     Head: Normocephalic and atraumatic.  Cardiovascular:     Rate and Rhythm: Normal rate and regular rhythm.     Pulses: Normal pulses.  Pulmonary:     Effort: Pulmonary effort is normal. No respiratory distress.  Musculoskeletal:        General: Normal range of motion.     Cervical back: Normal range of motion.     Right lower leg: No edema.     Left lower leg: No edema.  Skin:    General: Skin is warm and dry.     Capillary Refill: Capillary refill takes  less than 2 seconds.     Findings: No rash.  Neurological:     General: No focal deficit present.     Mental Status: She is alert and oriented to person, place, and time.     Sensory: Sensation is intact.     Motor: Motor function is intact. No weakness.     Coordination: Coordination normal.     Gait: Gait normal.  Psychiatric:        Mood and Affect: Mood normal.     Wt Readings from Last 3 Encounters:  11/23/20 172 lb (78 kg)  11/02/20 169 lb 15.6 oz (77.1 kg)  10/28/20 170 lb (77.1 kg)    BP 139/60   Pulse 75   Temp 99 F (37.2 C) (Oral)   Ht _0  (1.575 m)   Wt 172 lb (78 kg)   SpO2 96%   BMI 31.46 kg/m   Assessment and Plan: 1. Type II diabetes mellitus with complication (HCC) Pt will begin FSBS testing She has not been following a DM diet and is not exercising regularly May need to add medication - Actos if A1C is not improved - Hemoglobin A1c - blood glucose meter kit and supplies KIT; Dispense based on patient and insurance preference. Test twice a day.  Dispense: 1 each; Refill: 0  2. Essential hypertension Clinically stable exam with well controlled BP. Tolerating medications without side effects at this time. Pt to continue current regimen and low sodium diet; benefits of regular exercise as able discussed. - Comprehensive metabolic panel  3. Hyperlipidemia associated with type 2 diabetes mellitus ([place]) On statin therapy without side effects  4. Right rotator cuff tear arthropathy In PTx and being followed by [facility] Encourage her to walk for exercise to maintain balance Consider marking her step borders as a reminder   Partially dictated using Editor, commissioning. Any errors are unintentional.  Halina Maidens, MD [facility]  11/23/2020

## 2020-11-24 LAB — COMPREHENSIVE METABOLIC PANEL
ALT: 29 IU/L (ref 0–32)
AST: 26 IU/L (ref 0–40)
Albumin/Globulin Ratio: 1.8 (ref 1.2–2.2)
Albumin: 4.2 g/dL (ref 3.7–4.7)
Alkaline Phosphatase: 89 IU/L (ref 44–121)
BUN/Creatinine Ratio: 21 (ref 12–28)
BUN: 21 mg/dL (ref 8–27)
Bilirubin Total: 0.5 mg/dL (ref 0.0–1.2)
CO2: 27 mmol/L (ref 20–29)
Calcium: 9.4 mg/dL (ref 8.7–10.3)
Chloride: 96 mmol/L (ref 96–106)
Creatinine, Ser: 1 mg/dL (ref 0.57–1.00)
GFR calc Af Amer: 62 mL/min/{1.73_m2} (ref >59)
GFR calc non Af Amer: 54 mL/min/{1.73_m2} — ABNORMAL LOW (ref >59)
Globulin, Total: 2.3 g/dL (ref 1.5–4.5)
Glucose: 150 mg/dL — ABNORMAL HIGH (ref 65–99)
Potassium: 3.6 mmol/L (ref 3.5–5.2)
Sodium: 139 mmol/L (ref 134–144)
Total Protein: 6.5 g/dL (ref 6.0–8.5)

## 2020-11-24 LAB — HEMOGLOBIN A1C
Est. average glucose Bld gHb Est-mCnc: 163 mg/dL
Hgb A1c MFr Bld: 7.3 % — ABNORMAL HIGH (ref 4.8–5.6)

## 2020-11-25 DIAGNOSIS — M25511 Pain in right shoulder: Secondary | ICD-10-CM | POA: Diagnosis not present

## 2020-11-27 DIAGNOSIS — M25511 Pain in right shoulder: Secondary | ICD-10-CM | POA: Diagnosis not present

## 2020-12-03 DIAGNOSIS — C549 Malignant neoplasm of corpus uteri, unspecified: Secondary | ICD-10-CM | POA: Diagnosis not present

## 2020-12-08 ENCOUNTER — Other Ambulatory Visit: Payer: Self-pay | Admitting: Internal Medicine

## 2020-12-08 DIAGNOSIS — E119 Type 2 diabetes mellitus without complications: Secondary | ICD-10-CM

## 2020-12-08 NOTE — Telephone Encounter (Signed)
Requested Prescriptions  Pending Prescriptions Disp Refills  . JANUVIA 100 MG tablet [Pharmacy Med Name: JANUVIA 100 MG TABLET] 90 tablet 1    Sig: TAKE 1 TABLET BY MOUTH [place] DAY     Endocrinology:  Diabetes - DPP-4 Inhibitors Passed - 12/08/2020 11:57 AM      Passed - HBA1C is between 0 and 7.9 and within 180 days    Hgb A1c MFr Bld  Date Value Ref Range Status  11/23/2020 7.3 (H) 4.8 - 5.6 % Final    Comment:             Prediabetes: 5.7 - 6.4          Diabetes: >6.4          Glycemic control for adults with diabetes: <7.0          Passed - Cr in normal range and within 360 days    Creatinine, Ser  Date Value Ref Range Status  11/23/2020 1.00 0.57 - 1.00 mg/dL Final         Passed - Valid encounter within last 6 months    Recent Outpatient Visits          2 weeks ago Type II diabetes mellitus with complication [facility])   [facility] Glean Hess, MD   4 months ago Annual physical exam   [facility] Glean Hess, MD   9 months ago Trochanteric bursitis of left hip   [facility] Glean Hess, MD   1 year ago Essential hypertension   [place], Laura H, MD   1 year ago Generalized abdominal pain   [facility] Glean Hess, MD

## 2020-12-30 ENCOUNTER — Other Ambulatory Visit: Payer: Self-pay | Admitting: Internal Medicine

## 2020-12-30 NOTE — Telephone Encounter (Signed)
Requested Prescriptions  Pending Prescriptions Disp Refills  . pantoprazole (PROTONIX) 40 MG tablet [Pharmacy Med Name: PANTOPRAZOLE SOD DR 40 MG TAB] 90 tablet 1    Sig: TAKE 1 TABLET BY MOUTH EVERY DAY     Gastroenterology: Proton Pump Inhibitors Passed - 12/30/2020 11:04 AM      Passed - Valid encounter within last 12 months    Recent Outpatient Visits          1 month ago Type II diabetes mellitus with complication [facility])   [facility] Reubin Milan, MD   5 months ago Annual physical exam   [facility] Reubin Milan, MD   9 months ago Trochanteric bursitis of left hip   [facility] Reubin Milan, MD   1 year ago Essential hypertension   [facility] Reubin Milan, MD   1 year ago Generalized abdominal pain   [facility] Reubin Milan, MD

## 2021-01-13 ENCOUNTER — Ambulatory Visit (INDEPENDENT_AMBULATORY_CARE_PROVIDER_SITE_OTHER): Payer: Medicare HMO

## 2021-01-13 DIAGNOSIS — Z0000 Encounter for general adult medical examination without abnormal findings: Secondary | ICD-10-CM | POA: Diagnosis not present

## 2021-01-13 DIAGNOSIS — M654 Radial styloid tenosynovitis [de Quervain]: Secondary | ICD-10-CM | POA: Insufficient documentation

## 2021-01-13 DIAGNOSIS — Z1231 Encounter for screening mammogram for malignant neoplasm of breast: Secondary | ICD-10-CM | POA: Diagnosis not present

## 2021-01-13 DIAGNOSIS — M6530 Trigger finger, unspecified finger: Secondary | ICD-10-CM

## 2021-01-13 HISTORY — DX: Trigger finger, unspecified finger: M65.30

## 2021-01-13 HISTORY — DX: Radial styloid tenosynovitis (de quervain): M65.4

## 2021-01-13 NOTE — Patient Instructions (Signed)
Samantha Lang , Thank you for taking time to come for your Medicare Wellness Visit. I appreciate your ongoing commitment to your health goals. Please review the following plan we discussed and let me know if I can assist you in the future.   Screening recommendations/referrals: Colonoscopy: no longer required Mammogram: done 03/04/20 Bone Density: done 03/04/20 Recommended yearly ophthalmology/optometry visit for glaucoma screening and checkup Recommended yearly dental visit for hygiene and checkup  Vaccinations: Influenza vaccine: declined Pneumococcal vaccine: done 02/05/14 Tdap vaccine: done 10/28/20 Shingles vaccine: Shingrix discussed. Please contact your pharmacy for coverage information.  Covid-19: declined  Advanced directives: Please bring a copy of your health care power of attorney and living will to the office at your convenience once you have completed those documents.  Conditions/risks identified: Recommend increasing physical activity   Next appointment: Follow up in one year for your annual wellness visit    Preventive Care 65 Years and Older, Female Preventive care refers to lifestyle choices and visits with your health care provider that can promote health and wellness. What does preventive care include?  A yearly physical exam. This is also called an annual well check.  Dental exams once or twice a year.  Routine eye exams. Ask your health care provider how often you should have your eyes checked.  Personal lifestyle choices, including:  Daily care of your teeth and gums.  Regular physical activity.  Eating a healthy diet.  Avoiding tobacco and drug use.  Limiting alcohol use.  Practicing safe sex.  Taking low-dose aspirin every day.  Taking vitamin and mineral supplements as recommended by your health care provider. What happens during an annual well check? The services and screenings done by your health care provider during your annual well check will  depend on your age, overall health, lifestyle risk factors, and family history of disease. Counseling  Your health care provider may ask you questions about your:  Alcohol use.  Tobacco use.  Drug use.  Emotional well-being.  Home and relationship well-being.  Sexual activity.  Eating habits.  History of falls.  Memory and ability to understand (cognition).  Work and work Statistician.  Reproductive health. Screening  You may have the following tests or measurements:  Height, weight, and BMI.  Blood pressure.  Lipid and cholesterol levels. These may be checked every 5 years, or more frequently if you are over 20 years old.  Skin check.  Lung cancer screening. You may have this screening every year starting at age 75 if you have a 30-pack-year history of smoking and currently smoke or have quit within the past 15 years.  Fecal occult blood test (FOBT) of the stool. You may have this test every year starting at age 49.  Flexible sigmoidoscopy or colonoscopy. You may have a sigmoidoscopy every 5 years or a colonoscopy every 10 years starting at age 29.  Hepatitis C blood test.  Hepatitis B blood test.  Sexually transmitted disease (STD) testing.  Diabetes screening. This is done by checking your blood sugar (glucose) after you have not eaten for a while (fasting). You may have this done every 1-3 years.  Bone density scan. This is done to screen for osteoporosis. You may have this done starting at age 34.  Mammogram. This may be done every 1-2 years. Talk to your health care provider about how often you should have regular mammograms. Talk with your health care provider about your test results, treatment options, and if necessary, the need for more tests. Vaccines  Your health care provider may recommend certain vaccines, such as:  Influenza vaccine. This is recommended every year.  Tetanus, diphtheria, and acellular pertussis (Tdap, Td) vaccine. You may need a  Td booster every 10 years.  Zoster vaccine. You may need this after age 24.  Pneumococcal 13-valent conjugate (PCV13) vaccine. One dose is recommended after age 20.  Pneumococcal polysaccharide (PPSV23) vaccine. One dose is recommended after age 38. Talk to your health care provider about which screenings and vaccines you need and how often you need them. This information is not intended to replace advice given to you by your health care provider. Make sure you discuss any questions you have with your health care provider. Document Released: 01/08/2016 Document Revised: 08/31/2016 Document Reviewed: 10/13/2015 Elsevier Interactive Patient Education  2017 [place] Prevention in the Home Falls can cause injuries. They can happen to people of all ages. There are many things you can do to make your home safe and to help prevent falls. What can I do on the outside of my home?  Regularly fix the edges of walkways and driveways and fix any cracks.  Remove anything that might make you trip as you walk through a door, such as a raised step or threshold.  Trim any bushes or trees on the path to your home.  Use bright outdoor lighting.  Clear any walking paths of anything that might make someone trip, such as rocks or tools.  Regularly check to see if handrails are loose or broken. Make sure that both sides of any steps have handrails.  Any raised decks and porches should have guardrails on the edges.  Have any leaves, snow, or ice cleared regularly.  Use sand or salt on walking paths during winter.  Clean up any spills in your garage right away. This includes oil or grease spills. What can I do in the bathroom?  Use night lights.  Install grab bars by the toilet and in the tub and shower. Do not use towel bars as grab bars.  Use non-skid mats or decals in the tub or shower.  If you need to sit down in the shower, use a plastic, non-slip stool.  Keep the floor dry. Clean  up any water that spills on the floor as soon as it happens.  Remove soap buildup in the tub or shower regularly.  Attach bath mats securely with double-sided non-slip rug tape.  Do not have throw rugs and other things on the floor that can make you trip. What can I do in the bedroom?  Use night lights.  Make sure that you have a light by your bed that is easy to reach.  Do not use any sheets or blankets that are too big for your bed. They should not hang down onto the floor.  Have a firm chair that has side arms. You can use this for support while you get dressed.  Do not have throw rugs and other things on the floor that can make you trip. What can I do in the kitchen?  Clean up any spills right away.  Avoid walking on wet floors.  Keep items that you use a lot in easy-to-reach places.  If you need to reach something above you, use a strong step stool that has a grab bar.  Keep electrical cords out of the way.  Do not use floor polish or wax that makes floors slippery. If you must use wax, use non-skid floor wax.  Do  not have throw rugs and other things on the floor that can make you trip. What can I do with my stairs?  Do not leave any items on the stairs.  Make sure that there are handrails on both sides of the stairs and use them. Fix handrails that are broken or loose. Make sure that handrails are as long as the stairways.  Check any carpeting to make sure that it is firmly attached to the stairs. Fix any carpet that is loose or worn.  Avoid having throw rugs at the top or bottom of the stairs. If you do have throw rugs, attach them to the floor with carpet tape.  Make sure that you have a light switch at the top of the stairs and the bottom of the stairs. If you do not have them, ask someone to add them for you. What else can I do to help prevent falls?  Wear shoes that:  Do not have high heels.  Have rubber bottoms.  Are comfortable and fit you well.  Are  closed at the toe. Do not wear sandals.  If you use a stepladder:  Make sure that it is fully opened. Do not climb a closed stepladder.  Make sure that both sides of the stepladder are locked into place.  Ask someone to hold it for you, if possible.  Clearly mark and make sure that you can see:  Any grab bars or handrails.  First and last steps.  Where the edge of each step is.  Use tools that help you move around (mobility aids) if they are needed. These include:  Canes.  Walkers.  Scooters.  Crutches.  Turn on the lights when you go into a dark area. Replace any light bulbs as soon as they burn out.  Set up your furniture so you have a clear path. Avoid moving your furniture around.  If any of your floors are uneven, fix them.  If there are any pets around you, be aware of where they are.  Review your medicines with your doctor. Some medicines can make you feel dizzy. This can increase your chance of falling. Ask your doctor what other things that you can do to help prevent falls. This information is not intended to replace advice given to you by your health care provider. Make sure you discuss any questions you have with your health care provider. Document Released: 10/08/2009 Document Revised: 05/19/2016 Document Reviewed: 01/16/2015 Elsevier Interactive Patient Education  2017 [employer].

## 2021-01-13 NOTE — Progress Notes (Signed)
Subjective:   Samantha Lang is a 80 y.o. female who presents for Medicare Annual (Subsequent) preventive examination.  Virtual Visit via Telephone Note  I connected with  TYESHA JOFFE on 01/13/21 at 11:20 AM EST by telephone and verified that I am speaking with the correct person using two identifiers.  Location: Patient: home Provider: [facility] Persons participating in the virtual visit: [place]   I discussed the limitations, risks, security and privacy concerns of performing an evaluation and management service by telephone and the availability of in person appointments. The patient expressed understanding and agreed to proceed.  Interactive audio and video telecommunications were attempted between this nurse and patient, however failed, due to patient having technical difficulties OR patient did not have access to video capability.  We continued and completed visit with audio only.  Some vital signs may be absent or patient reported.   Clemetine Marker, LPN    Review of Systems     Cardiac Risk Factors include: advanced age (>57mn, >>5women);diabetes mellitus;dyslipidemia;hypertension;obesity (BMI >30kg/m2)     Objective:    There were no vitals filed for this visit. There is no height or weight on file to calculate BMI.  Advanced Directives 01/13/2021 11/02/2020 10/28/2020 01/13/2020 07/20/2019 01/09/2019 11/13/2017  Does Patient Have a Medical Advance Directive? Yes No No No No No No  Type of AParamedicof AGrandinLiving will - - - - - -  Copy of HLouviersin Chart? No - copy requested - - - - - -  Would patient like information on creating a medical advance directive? - - - Yes (MAU/Ambulatory/Procedural Areas - Information given) - Yes (MAU/Ambulatory/Procedural Areas - Information given) Yes (MAU/Ambulatory/Procedural Areas - Information given)    Current Medications (verified) Outpatient Encounter Medications  as of 01/13/2021  Medication Sig  . allopurinol (ZYLOPRIM) 100 MG tablet TAKE 1 TABLET BY MOUTH EVERY DAY  . amLODipine (NORVASC) 5 MG tablet Take 1 tablet (5 mg total) by mouth 2 (two) times daily.  .Marland Kitchenaspirin 81 MG tablet Take 81 mg by mouth daily.  .Marland Kitchenatorvastatin (LIPITOR) 10 MG tablet Take 1 tablet by mouth daily.  . carvedilol (COREG) 3.125 MG tablet Takes 2 tables twice a day  . colchicine 0.6 MG tablet Take 0.6 mg by mouth daily. PRN only, takes rarely  . JANUVIA 100 MG tablet TAKE 1 TABLET BY MOUTH EVERY DAY  . meloxicam (MOBIC) 15 MG tablet meloxicam 15 mg tablet  . methocarbamol (ROBAXIN) 500 MG tablet Take 500 mg by mouth 3 (three) times daily.  . Multiple Vitamin (MULTIVITAMIN) capsule Take 1 capsule by mouth daily.  . pantoprazole (PROTONIX) 40 MG tablet TAKE 1 TABLET BY MOUTH EVERY DAY  . psyllium (METAMUCIL SMOOTH TEXTURE) 58.6 % powder Please use one does every other day.  . telmisartan-hydrochlorothiazide (MICARDIS HCT) 80-25 MG tablet Take 1 tablet by mouth daily.  .Marland Kitchenvenlafaxine XR (EFFEXOR-XR) 150 MG 24 hr capsule TAKE (1) CAPSULE BY MOUTH EVERY DAY  . blood glucose meter kit and supplies KIT Dispense based on patient and insurance preference. Test twice a day. (Patient not taking: Reported on 01/13/2021)  . calcium-vitamin D (OSCAL WITH D) 500-200 MG-UNIT tablet Take 1 tablet by mouth. (Patient not taking: Reported on 01/13/2021)  . tiZANidine (ZANAFLEX) 4 MG tablet as needed. (Patient not taking: Reported on 01/13/2021)   No facility-administered encounter medications on file as of 01/13/2021.    Allergies (verified) Atorvastatin, Cephalexin, Clarithromycin, Lipitor [ONEOK  calcium], Cephalexin, and Clarithromycin   History: Past Medical History:  Diagnosis Date  . Allergy   . Anxiety   . Aortic valve stenosis, mild   . Depression    mild  . Diabetes mellitus age 27  . GERD (gastroesophageal reflux disease)   . Heart murmur   . Hemorrhoids   . Hyperlipidemia    . Hypertension 2003  . Incontinence    Female stress  . Mitral incompetence   . Personal history of chemotherapy    3 treatments  . Personal history of radiation therapy    5 treatments  . Postmenopausal atrophic vaginitis   . Torn rotator cuff   . Uterine cancer [facility])    treated   Past Surgical History:  Procedure Laterality Date  . ABDOMINAL HYSTERECTOMY  07/2016  . BREAST BIOPSY Right 03/21/08   right, benign   . COLONOSCOPY WITH PROPOFOL N/A 09/22/2017   Procedure: COLONOSCOPY WITH PROPOFOL;  Surgeon: Lucilla Lame, MD;  Location: [place];  Service: Gastroenterology;  Laterality: N/A;  diabetic  . EYE SURGERY  Oct 2009   for ptosis,  Dr. Rosaria Ferries, eyelid lift  . POLYPECTOMY  09/22/2017   Procedure: POLYPECTOMY;  Surgeon: Lucilla Lame, MD;  Location: [place];  Service: Gastroenterology;;  . TUBAL LIGATION     Family History  Problem Relation Age of Onset  . Breast cancer Paternal Aunt   . Breast cancer Paternal Grandmother 45  . Cancer Father 54       lung  . Stroke Mother   . Hypertension Mother   . Cancer Brother        prostate  . Depression Brother   . Ovarian cancer Neg Hx   . Colon cancer Neg Hx   . Diabetes Neg Hx    Social History   Socioeconomic History  . Marital status: Married    Spouse name: Not on file  . Number of children: 2  . Years of education: Not on file  . Highest education level: Master's degree (e.g., MA, MS, MEng, MEd, MSW, MBA)  Occupational History  . Occupation: Retired    Comment: Engineer, mining  Tobacco Use  . Smoking status: Former Smoker    Packs/day: 0.25    Years: 20.00    Pack years: 5.00    Types: Cigarettes    Quit date: 09/25/1988    Years since quitting: 32.3  . Smokeless tobacco: Never Used  Vaping Use  . Vaping Use: Never used  Substance and Sexual Activity  . Alcohol use: Yes    Alcohol/week: 6.0 standard drinks    Types: 2 Glasses of wine, 2 Cans of beer, 2 Shots of liquor per week   . Drug use: No  . Sexual activity: Yes    Birth control/protection: Post-menopausal  Other Topics Concern  . Not on file  Social History Narrative  . Not on file   Social Determinants of Health   Financial Resource Strain: Low Risk   . Difficulty of Paying Living Expenses: Not hard at all  Food Insecurity: No Food Insecurity  . Worried About Charity fundraiser in the Last Year: Never true  . Ran Out of Food in the Last Year: Never true  Transportation Needs: No Transportation Needs  . Lack of Transportation (Medical): No  . Lack of Transportation (Non-Medical): No  Physical Activity: Inactive  . Days of Exercise per Week: 0 days  . Minutes of Exercise per Session: 0 min  Stress: No Stress  Concern Present  . Feeling of Stress : Not at all  Social Connections: Moderately Isolated  . Frequency of Communication with Friends and Family: More than three times a week  . Frequency of Social Gatherings with Friends and Family: Twice a week  . Attends Religious Services: Never  . Active Member of Clubs or Organizations: No  . Attends Archivist Meetings: Never  . Marital Status: Married    Tobacco Counseling Counseling given: Not Answered   Clinical Intake:  Pre-visit preparation completed: Yes  Pain : No/denies pain     Nutritional Risks: None Diabetes: Yes CBG done?: No Did pt. bring in CBG monitor from home?: No  How often do you need to have someone help you when you read instructions, pamphlets, or other written materials from your doctor or pharmacy?: 1 - Never  Nutrition Risk Assessment:  Has the patient had any N/V/D within the last 2 months?  No  Does the patient have any non-healing wounds?  No  Has the patient had any unintentional weight loss or weight gain?  No   Diabetes:  Is the patient diabetic?  Yes  If diabetic, was a CBG obtained today?  No  Did the patient bring in their glucometer from home?  No  How often do you monitor your  CBG's? Pt does not actively check blood sugar.   Financial Strains and Diabetes Management:  Are you having any financial strains with the device, your supplies or your medication? No .  Does the patient want to be seen by Chronic Care Management for management of their diabetes?  No  Would the patient like to be referred to a Nutritionist or for Diabetic Management?  No   Diabetic Exams:  Diabetic Eye Exam: Completed 07/14/20 [facility].   Diabetic Foot Exam: Completed 07/14/20.     Interpreter Needed?: No  Information entered by :: Clemetine Marker LPN   Activities of Daily Living In your present state of health, do you have any difficulty performing the following activities: 01/13/2021  Hearing? Y  Comment declines hearing aids  Vision? N  Difficulty concentrating or making decisions? N  Walking or climbing stairs? N  Dressing or bathing? N  Doing errands, shopping? N  Preparing Food and eating ? N  Using the Toilet? N  In the past six months, have you accidently leaked urine? Y  Comment wears pads for protection  Do you have problems with loss of bowel control? N  Managing your Medications? N  Managing your Finances? N  Housekeeping or managing your Housekeeping? N  Some recent data might be hidden    Patient Care Team: Glean Hess, MD as PCP - General (Internal Medicine) Corey Skains, MD as Consulting Physician (Cardiology) Monia Pouch Consuello Masse, MD as Referring Physician (Obstetrics and Gynecology) Margaretha Sheffield, MD (Otolaryngology)  Indicate any recent Medical Services you may have received from other than [facility] providers in the past year (date may be approximate).     Assessment:   This is a routine wellness examination for [place].  Hearing/Vision screen  Hearing Screening   125Hz  250Hz  500Hz  1000Hz  2000Hz  3000Hz  4000Hz  6000Hz  8000Hz   Right ear:           Left ear:           Comments: Pt c/o difficulty hearing in left ear per Dr. Kathyrn Sheriff  and will follow up with additional hearing evaluation.   Vision Screening Comments: Annual vision screenings done at [facility]  Dietary issues and exercise activities discussed: Current Exercise Habits: The patient does not participate in regular exercise at present, Exercise limited by: orthopedic condition(s)  Goals    . Exercise 150 min/wk Moderate Activity     Recommend to exercise at least 3-4 times per week for at least 30-40 minutes      Depression Screen PHQ 2/9 Scores 01/13/2021 11/23/2020 07/14/2020 03/06/2020 01/13/2020 10/09/2019 04/24/2019  PHQ - 2 Score 0 0 2 3 1 6 1   PHQ- 9 Score - 4 5 4  - 20 -    Fall Risk Fall Risk  01/13/2021 11/23/2020 07/14/2020 03/06/2020 01/13/2020  Falls in the past year? 1 1 0 0 1  Number falls in past yr: 1 1 0 - 1  Comment - - - - mechanical fall  Injury with Fall? 1 1 0 - 0  Risk for fall due to : History of fall(s);Orthopedic patient - No Fall Risks - History of fall(s)  Follow up Falls prevention discussed Falls evaluation completed Falls evaluation completed Falls evaluation completed Falls prevention discussed    FALL RISK PREVENTION PERTAINING TO THE HOME:  Any stairs in or around the home? Yes  If so, are there any without handrails? Yes - back steps Home free of loose throw rugs in walkways, pet beds, electrical cords, etc? Yes  Adequate lighting in your home to reduce risk of falls? Yes   ASSISTIVE DEVICES UTILIZED TO PREVENT FALLS:  Life alert? No  Use of a cane, walker or w/c? No  Grab bars in the bathroom? No  Shower chair or bench in shower? No  Elevated toilet seat or a handicapped toilet? No   TIMED UP AND GO:  Was the test performed? No . Telephonic visit.  Cognitive Function: pt declines 6CIT for 2022 AWV.      6CIT Screen 01/28/2019 11/13/2017  What Year? 0 points 0 points  What month? 0 points 0 points  What time? 0 points 3 points  Count back from 20 0 points 0 points  Months in reverse 0 points 0  points  Repeat phrase 4 points 4 points  Total Score 4 7    Immunizations Immunization History  Administered Date(s) Administered  . Fluad Quad(high Dose 65+) 10/09/2019  . Influenza, High Dose Seasonal PF 11/12/2018  . Influenza,inj,Quad PF,6+ Mos 10/02/2015, 11/07/2016, 11/01/2017  . Pneumococcal Conjugate-13 02/05/2014  . Pneumococcal Polysaccharide-23 06/09/2008  . Tdap 06/09/2010, 10/28/2020     TDAP status: Up to date  Flu Vaccine status: Declined, Education has been provided regarding the importance of this vaccine but patient still declined. Advised may receive this vaccine at local pharmacy or Health Dept. Aware to provide a copy of the vaccination record if obtained from local pharmacy or Health Dept. Verbalized acceptance and understanding.  Pneumococcal vaccine status: Up to date  Covid-19 vaccine status: Declined, Education has been provided regarding the importance of this vaccine but patient still declined. Advised may receive this vaccine at local pharmacy or Health Dept.or vaccine clinic. Aware to provide a copy of the vaccination record if obtained from local pharmacy or Health Dept. Verbalized acceptance and understanding.  Qualifies for Shingles Vaccine? Yes   Zostavax completed No   Shingrix Completed?: No.    Education has been provided regarding the importance of this vaccine. Patient has been advised to call insurance company to determine out of pocket expense if they have not yet received this vaccine. Advised may also receive vaccine at local pharmacy or Health Dept.  Verbalized acceptance and understanding.  Screening Tests Health Maintenance  Topic Date Due  . COVID-19 Vaccine (1) 01/29/2021 (Originally 11/12/1953)  . INFLUENZA VACCINE  03/25/2021 (Originally 07/26/2020)  . HEMOGLOBIN A1C  05/23/2021  . FOOT EXAM  07/14/2021  . OPHTHALMOLOGY EXAM  07/14/2021  . TETANUS/TDAP  10/28/2030  . DEXA SCAN  Completed  . Hepatitis C Screening  Completed  . PNA  vac Low Risk Adult  Completed    Health Maintenance  There are no preventive care reminders to display for this patient.  Colorectal cancer screening: No longer required.   Mammogram status: Completed 03/04/20. Repeat every year  Bone Density status: Completed 03/04/20. Results reflect: Bone density results: OSTEOPENIA. Repeat every 2 years.  Lung Cancer Screening: (Low Dose CT Chest recommended if Age 78-80 years, 30 pack-year currently smoking OR have quit w/in 15years.) does not qualify.   Additional Screening:  Hepatitis C Screening: does qualify; Completed 07/14/20  Vision Screening: Recommended annual ophthalmology exams for early detection of glaucoma and other disorders of the eye. Is the patient up to date with their annual eye exam?  Yes  Who is the provider or what is the name of the office in which the patient attends annual eye exams? [facility] Screening: Recommended annual dental exams for proper oral hygiene  Community Resource Referral / Chronic Care Management: CRR required this visit?  No   CCM required this visit?  No      Plan:     I have personally reviewed and noted the following in the patient's chart:   . Medical and social history . Use of alcohol, tobacco or illicit drugs  . Current medications and supplements . Functional ability and status . Nutritional status . Physical activity . Advanced directives . List of other physicians . Hospitalizations, surgeries, and ER visits in previous 12 months . Vitals . Screenings to include cognitive, depression, and falls . Referrals and appointments  In addition, I have reviewed and discussed with patient certain preventive protocols, quality metrics, and best practice recommendations. A written personalized care plan for preventive services as well as general preventive health recommendations were provided to patient.     Clemetine Marker, LPN   12/31/2692   Nurse Notes: pt states she  needs new rx for carvedilol 3.125 mg 2 tab BID due to incorrect amount on last rx. Pt advised to contact Dr. Nehemiah Massed since he is prescribing physician.

## 2021-02-15 ENCOUNTER — Other Ambulatory Visit: Payer: Self-pay | Admitting: Internal Medicine

## 2021-02-15 NOTE — Telephone Encounter (Signed)
Left message for patient to call back and set up appointment

## 2021-02-24 DIAGNOSIS — E782 Mixed hyperlipidemia: Secondary | ICD-10-CM | POA: Diagnosis not present

## 2021-02-24 DIAGNOSIS — R0600 Dyspnea, unspecified: Secondary | ICD-10-CM | POA: Diagnosis not present

## 2021-02-24 DIAGNOSIS — R9431 Abnormal electrocardiogram [ECG] [EKG]: Secondary | ICD-10-CM | POA: Diagnosis not present

## 2021-02-24 DIAGNOSIS — I10 Essential (primary) hypertension: Secondary | ICD-10-CM | POA: Diagnosis not present

## 2021-02-24 DIAGNOSIS — I350 Nonrheumatic aortic (valve) stenosis: Secondary | ICD-10-CM | POA: Diagnosis not present

## 2021-02-24 DIAGNOSIS — I6523 Occlusion and stenosis of bilateral carotid arteries: Secondary | ICD-10-CM | POA: Diagnosis not present

## 2021-02-24 DIAGNOSIS — I119 Hypertensive heart disease without heart failure: Secondary | ICD-10-CM | POA: Diagnosis not present

## 2021-03-09 DIAGNOSIS — R0600 Dyspnea, unspecified: Secondary | ICD-10-CM | POA: Diagnosis not present

## 2021-03-09 DIAGNOSIS — R9431 Abnormal electrocardiogram [ECG] [EKG]: Secondary | ICD-10-CM | POA: Diagnosis not present

## 2021-03-18 NOTE — Progress Notes (Signed)
Date:  03/19/2021   Name:  Samantha Lang   DOB:  11-20-1941   MRN:  [id number]   Chief Complaint: Diabetes and Hypertension  Diabetes She presents for her follow-up diabetic visit. She has type 2 diabetes mellitus. Her disease course has been stable. Pertinent negatives for hypoglycemia include no dizziness or headaches. Pertinent negatives for diabetes include no chest pain, no fatigue and no weakness. Current diabetic treatment includes oral agent (monotherapy). She is compliant with treatment all of the time. An ACE inhibitor/angiotensin II receptor blocker is being taken. Eye exam is current.  Hypertension This is a chronic problem. The problem is controlled. Pertinent negatives include no chest pain, headaches, palpitations or shortness of breath. Past treatments include beta blockers, calcium channel blockers, angiotensin blockers and diuretics. The current treatment provides significant improvement. There are no compliance problems.   Hyperlipidemia This is a chronic problem. The problem is uncontrolled. Recent lipid tests were reviewed and are high. Exacerbating diseases include diabetes. Pertinent negatives include no chest pain or shortness of breath. Current antihyperlipidemic treatment includes statins (LDL 84). Risk factors for coronary artery disease include diabetes mellitus, dyslipidemia, hypertension, a sedentary lifestyle and post-menopausal.  Depression        This is a chronic problem.  The problem has been gradually improving since onset.  Associated symptoms include no fatigue and no headaches.  Past treatments include SNRIs - Serotonin and norepinephrine reuptake inhibitors.  Compliance with treatment is good.  Previous treatment provided moderate (seasonal depression improving with spring changes) relief.   Lab Results  Component Value Date   CREATININE 1.00 11/23/2020   BUN 21 11/23/2020   NA 139 11/23/2020   K 3.6 11/23/2020   CL 96 11/23/2020   CO2 27  11/23/2020   Lab Results  Component Value Date   CHOL 165 07/14/2020   HDL 59 07/14/2020   LDLCALC 84 07/14/2020   LDLDIRECT 138.3 09/26/2011   TRIG 126 07/14/2020   CHOLHDL 2.8 07/14/2020   Lab Results  Component Value Date   TSH 5.100 (H) 07/14/2020   Lab Results  Component Value Date   HGBA1C 7.1 (A) 03/19/2021   Lab Results  Component Value Date   WBC 5.2 07/14/2020   HGB 12.7 07/14/2020   HCT 37.1 07/14/2020   MCV 91 07/14/2020   PLT 208 07/14/2020   Lab Results  Component Value Date   ALT 29 11/23/2020   AST 26 11/23/2020   ALKPHOS 89 11/23/2020   BILITOT 0.5 11/23/2020     Review of Systems  Constitutional: Negative for fatigue and unexpected weight change.  HENT: Negative for nosebleeds.   Eyes: Negative for visual disturbance.  Respiratory: Negative for cough, chest tightness, shortness of breath and wheezing.   Cardiovascular: Negative for chest pain, palpitations and leg swelling.  Gastrointestinal: Negative for abdominal pain, constipation and diarrhea.  Neurological: Negative for dizziness, weakness, light-headedness and headaches.  Psychiatric/Behavioral: Positive for depression.    Patient Active Problem List   Diagnosis Date Noted  . Acquired trigger finger 01/13/2021  . Radial styloid tenosynovitis 01/13/2021  . 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 LVH (left ventricular hypertrophy) due to hypertensive disease, without heart failure 04/11/2019  . Foot pain, right 10/10/2018  . Ganglion cyst of left foot 08/29/2018  . Xerosis of skin 08/29/2018  . Palpitations 08/08/2018  . Colon cancer screening   . Trochanteric bursitis of left hip 12/07/2016  . History  of endometrial cancer 08/03/2016  . Edema leg 04/13/2016  . Type II diabetes mellitus with complication ([place]) 10/93/2355  . Shoulder pain, left 02/12/2016  . Acid reflux 10/02/2015  . 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 TI  (tricuspid incompetence) 04/09/2015  . Hyperlipidemia associated with type 2 diabetes mellitus ([place]) 04/08/2015  . Aortic heart valve narrowing 03/26/2015  . Bilateral carotid artery stenosis 03/26/2015  . Anxiety   . Environmental and seasonal allergies   . Postmenopausal atrophic vaginitis   . Depression, major, recurrent, moderate ([place])   . 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 Essential hypertension     Allergies  Allergen Reactions  . Atorvastatin   . Cephalexin   . Clarithromycin   . Lipitor [Atorvastatin Calcium]     Legs ache  With higher dose  . Cephalexin Rash  . Clarithromycin Rash    Past Surgical History:  Procedure Laterality Date  . ABDOMINAL HYSTERECTOMY  07/2016  . BREAST BIOPSY Right 03/21/08   right, benign   . COLONOSCOPY WITH PROPOFOL N/A 09/22/2017   Procedure: COLONOSCOPY WITH PROPOFOL;  Surgeon: Lucilla Lame, MD;  Location: [facility];  Service: Gastroenterology;  Laterality: N/A;  diabetic  . EYE SURGERY  Oct 2009   for ptosis,  Dr. Rosaria Ferries, eyelid lift  . POLYPECTOMY  09/22/2017   Procedure: POLYPECTOMY;  Surgeon: Lucilla Lame, MD;  Location: [facility];  Service: Gastroenterology;;  . TUBAL LIGATION      Social History   Tobacco Use  . Smoking status: Former Smoker    Packs/day: 0.25    Years: 20.00    Pack years: 5.00    Types: Cigarettes    Quit date: 09/25/1988    Years since quitting: 32.5  . Smokeless tobacco: Never Used  Vaping Use  . Vaping Use: Never used  Substance Use Topics  . Alcohol use: Yes    Alcohol/week: 6.0 standard drinks    Types: 2 Glasses of wine, 2 Cans of beer, 2 Shots of liquor per week  . Drug use: No     Medication list has been reviewed and updated.  Current Meds  Medication Sig  . allopurinol (ZYLOPRIM) 100 MG tablet TAKE 1 TABLET BY MOUTH EVERY DAY  . amLODipine (NORVASC) 5 MG tablet TAKE 1 TABLET BY MOUTH TWICE A DAY  . aspirin 81 MG tablet Take 81  mg by mouth daily.  Marland Kitchen atorvastatin (LIPITOR) 10 MG tablet Take 1 tablet by mouth daily.  . blood glucose meter kit and supplies KIT Dispense based on patient and insurance preference. Test twice a day.  . calcium-vitamin D (OSCAL WITH D) 500-200 MG-UNIT tablet Take 1 tablet by mouth.  . carvedilol (COREG) 3.125 MG tablet Take 3.125 mg by mouth 2 (two) times daily with a meal.  . Cetirizine HCl 10 MG CAPS Take by mouth.  . Cholecalciferol 125 MCG (5000 UT) TABS Take by mouth.  . colchicine 0.6 MG tablet Take 0.6 mg by mouth daily. PRN only, takes rarely  . JANUVIA 100 MG tablet TAKE 1 TABLET BY MOUTH EVERY DAY  . methocarbamol (ROBAXIN) 500 MG tablet Take 500 mg by mouth 3 (three) times daily.  . Multiple Vitamin (MULTIVITAMIN) capsule Take 1 capsule by mouth daily.  . pantoprazole (PROTONIX) 40 MG tablet TAKE 1 TABLET BY MOUTH EVERY DAY  . psyllium (METAMUCIL SMOOTH TEXTURE) 58.6 % powder Please use one does every other day.  . telmisartan-hydrochlorothiazide (MICARDIS HCT) 80-25 MG tablet Take  1 tablet by mouth daily.  Marland Kitchen venlafaxine XR (EFFEXOR-XR) 150 MG 24 hr capsule TAKE (1) CAPSULE BY MOUTH EVERY DAY    PHQ 2/9 Scores 03/19/2021 01/13/2021 11/23/2020 07/14/2020  PHQ - 2 Score 4 0 0 2  PHQ- 9 Score 6 - 4 5    GAD 7 : Generalized Anxiety Score 03/19/2021 11/23/2020 07/14/2020 03/06/2020  Nervous, Anxious, on Edge 0 0 0 0  Control/stop worrying 0 0 1 0  Worry too much - different things 0 0 1 0  Trouble relaxing 0 0 0 0  Restless 0 0 0 0  Easily annoyed or irritable 0 0 0 0  Afraid - awful might happen 0 0 0 0  Total GAD 7 Score 0 0 2 0  Anxiety Difficulty Not difficult at all - Not difficult at all -    BP Readings from Last 3 Encounters:  03/19/21 140/86  11/23/20 139/60  11/02/20 (!) 169/50    Physical Exam Vitals and nursing note reviewed.  Constitutional:      General: She is not in acute distress.    Appearance: She is well-developed.  HENT:     Head: Normocephalic and  atraumatic.  Cardiovascular:     Rate and Rhythm: Normal rate and regular rhythm.     Pulses: Normal pulses.     Heart sounds: No murmur heard.   Pulmonary:     Effort: Pulmonary effort is normal. No respiratory distress.     Breath sounds: No wheezing or rhonchi.  Musculoskeletal:     Right lower leg: No edema.     Left lower leg: No edema.  Skin:    General: Skin is warm and dry.     Findings: No rash.  Neurological:     Mental Status: She is alert and oriented to person, place, and time.  Psychiatric:        Mood and Affect: Mood normal.        Behavior: Behavior normal.     Wt Readings from Last 3 Encounters:  03/19/21 169 lb (76.7 kg)  11/23/20 172 lb (78 kg)  11/02/20 169 lb 15.6 oz (77.1 kg)    BP 140/86   Pulse 67   Ht 5' 2"  (1.575 m)   Wt 169 lb (76.7 kg)   SpO2 96%   BMI 30.91 kg/m   Assessment and Plan: 1. Essential hypertension BP fairly well controlled Continue all 4 medications Has hx of CAD - recent stress test was normal per review in Care Everywhere  2. Type II diabetes mellitus with complication (HCC) Clinically stable by exam and report without s/s of hypoglycemia. DM complicated by HTN. Tolerating medications- Januvia - well without side effects or other concerns. - POCT glycosylated hemoglobin (Hb A1C)  3. Hyperlipidemia associated with type 2 diabetes mellitus ([place]) Tolerating statin medication without side effects at this time LDL is at goal of < 70 on current dose Continue same therapy without change at this time.  4. 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 Depression, major, recurrent, moderate (HCC) Clinically stable on current regimen with good control of symptoms, No SI or HI. Will continue current therapy. - venlafaxine XR (EFFEXOR-XR) 150 MG 24 hr capsule; TAKE (1) CAPSULE BY MOUTH EVERY DAY  Dispense: 90 capsule; Refill: 1  6. Idiopathic gout of foot, unspecified chronicity,  unspecified laterality - allopurinol (ZYLOPRIM) 100 MG tablet; Take 1 tablet (100 mg total) by mouth daily.  Dispense: 90 tablet; Refill:  1   Partially dictated using Editor, commissioning. Any errors are unintentional.  Halina Maidens, MD [facility]  03/19/2021

## 2021-03-19 ENCOUNTER — Other Ambulatory Visit: Payer: Self-pay | Admitting: Internal Medicine

## 2021-03-19 ENCOUNTER — Ambulatory Visit (INDEPENDENT_AMBULATORY_CARE_PROVIDER_SITE_OTHER): Payer: Medicare HMO | Admitting: Internal Medicine

## 2021-03-19 ENCOUNTER — Encounter: Payer: Self-pay | Admitting: Internal Medicine

## 2021-03-19 ENCOUNTER — Other Ambulatory Visit: Payer: Self-pay

## 2021-03-19 VITALS — BP 140/86 | HR 67 | Ht 62.0 in | Wt 169.0 lb

## 2021-03-19 DIAGNOSIS — E118 Type 2 diabetes mellitus with unspecified complications: Secondary | ICD-10-CM

## 2021-03-19 DIAGNOSIS — M10079 Idiopathic gout, unspecified ankle and foot: Secondary | ICD-10-CM

## 2021-03-19 DIAGNOSIS — E1129 Type 2 diabetes mellitus with other diabetic kidney complication: Secondary | ICD-10-CM | POA: Diagnosis not present

## 2021-03-19 DIAGNOSIS — F331 Major depressive disorder, recurrent, moderate: Secondary | ICD-10-CM

## 2021-03-19 DIAGNOSIS — E1169 Type 2 diabetes mellitus with other specified complication: Secondary | ICD-10-CM

## 2021-03-19 DIAGNOSIS — I10 Essential (primary) hypertension: Secondary | ICD-10-CM

## 2021-03-19 DIAGNOSIS — E785 Hyperlipidemia, unspecified: Secondary | ICD-10-CM | POA: Diagnosis not present

## 2021-03-19 DIAGNOSIS — Z1231 Encounter for screening mammogram for malignant neoplasm of breast: Secondary | ICD-10-CM

## 2021-03-19 DIAGNOSIS — R69 Illness, unspecified: Secondary | ICD-10-CM | POA: Diagnosis not present

## 2021-03-19 DIAGNOSIS — R809 Proteinuria, unspecified: Secondary | ICD-10-CM | POA: Diagnosis not present

## 2021-03-19 LAB — POCT GLYCOSYLATED HEMOGLOBIN (HGB A1C): Hemoglobin A1C: 7.1 % — AB (ref 4.0–5.6)

## 2021-03-19 MED ORDER — ALLOPURINOL 100 MG PO TABS: 100.0000 mg | ORAL_TABLET | Freq: Every day | ORAL | 1 refills | Status: DC

## 2021-03-19 MED ORDER — VENLAFAXINE HCL ER 150 MG PO CP24: ORAL_CAPSULE | ORAL | 1 refills | Status: DC

## 2021-03-31 ENCOUNTER — Telehealth: Payer: Self-pay

## 2021-03-31 NOTE — Telephone Encounter (Signed)
Spoke with patient about her concerns for her husband. Patient husband scheduled for this afternoon for appt.

## 2021-03-31 NOTE — Telephone Encounter (Unsigned)
Copied from [place] [id number]. Topic: General - Other >> Mar 31, 2021 11:15 AM Yvette Rack wrote: Reason for CRM: Pt requests to speak with Chassidy. Pt requests call back. Cb# [phone]

## 2021-04-16 DIAGNOSIS — E1151 Type 2 diabetes mellitus with diabetic peripheral angiopathy without gangrene: Secondary | ICD-10-CM | POA: Diagnosis not present

## 2021-04-16 DIAGNOSIS — E785 Hyperlipidemia, unspecified: Secondary | ICD-10-CM | POA: Diagnosis not present

## 2021-04-16 DIAGNOSIS — I6523 Occlusion and stenosis of bilateral carotid arteries: Secondary | ICD-10-CM | POA: Diagnosis not present

## 2021-04-16 DIAGNOSIS — E119 Type 2 diabetes mellitus without complications: Secondary | ICD-10-CM | POA: Diagnosis not present

## 2021-04-16 DIAGNOSIS — R9431 Abnormal electrocardiogram [ECG] [EKG]: Secondary | ICD-10-CM | POA: Diagnosis not present

## 2021-04-16 DIAGNOSIS — I350 Nonrheumatic aortic (valve) stenosis: Secondary | ICD-10-CM | POA: Diagnosis not present

## 2021-04-16 DIAGNOSIS — R0600 Dyspnea, unspecified: Secondary | ICD-10-CM | POA: Diagnosis not present

## 2021-04-23 DIAGNOSIS — I350 Nonrheumatic aortic (valve) stenosis: Secondary | ICD-10-CM | POA: Diagnosis not present

## 2021-04-23 DIAGNOSIS — I10 Essential (primary) hypertension: Secondary | ICD-10-CM | POA: Diagnosis not present

## 2021-04-23 DIAGNOSIS — E119 Type 2 diabetes mellitus without complications: Secondary | ICD-10-CM | POA: Diagnosis not present

## 2021-04-23 DIAGNOSIS — E782 Mixed hyperlipidemia: Secondary | ICD-10-CM | POA: Diagnosis not present

## 2021-04-23 DIAGNOSIS — I6523 Occlusion and stenosis of bilateral carotid arteries: Secondary | ICD-10-CM | POA: Diagnosis not present

## 2021-04-27 ENCOUNTER — Other Ambulatory Visit: Payer: Self-pay

## 2021-04-27 ENCOUNTER — Ambulatory Visit
Admission: RE | Admit: 2021-04-27 | Discharge: 2021-04-27 | Disposition: A | Discharge status: Home / Self Care | Payer: Medicare HMO | Source: Ambulatory Visit | Attending: Internal Medicine | Admitting: Internal Medicine

## 2021-04-27 DIAGNOSIS — Z1231 Encounter for screening mammogram for malignant neoplasm of breast: Secondary | ICD-10-CM | POA: Diagnosis not present

## 2021-04-28 ENCOUNTER — Other Ambulatory Visit: Payer: Self-pay | Admitting: Internal Medicine

## 2021-04-28 DIAGNOSIS — R928 Other abnormal and inconclusive findings on diagnostic imaging of breast: Secondary | ICD-10-CM

## 2021-05-03 ENCOUNTER — Ambulatory Visit
Admission: RE | Admit: 2021-05-03 | Discharge: 2021-05-03 | Disposition: A | Discharge status: Home / Self Care | Payer: Medicare HMO | Source: Ambulatory Visit | Attending: Internal Medicine | Admitting: Internal Medicine

## 2021-05-03 ENCOUNTER — Other Ambulatory Visit: Payer: Self-pay

## 2021-05-03 DIAGNOSIS — R922 Inconclusive mammogram: Secondary | ICD-10-CM | POA: Diagnosis not present

## 2021-05-03 DIAGNOSIS — R928 Other abnormal and inconclusive findings on diagnostic imaging of breast: Secondary | ICD-10-CM

## 2021-05-25 DIAGNOSIS — H903 Sensorineural hearing loss, bilateral: Secondary | ICD-10-CM | POA: Diagnosis not present

## 2021-05-25 DIAGNOSIS — R0981 Nasal congestion: Secondary | ICD-10-CM | POA: Diagnosis not present

## 2021-05-25 DIAGNOSIS — J310 Chronic rhinitis: Secondary | ICD-10-CM | POA: Diagnosis not present

## 2021-05-25 DIAGNOSIS — H90A22 Sensorineural hearing loss, unilateral, left ear, with restricted hearing on the contralateral side: Secondary | ICD-10-CM | POA: Diagnosis not present

## 2021-06-02 ENCOUNTER — Other Ambulatory Visit: Payer: Self-pay | Admitting: Internal Medicine

## 2021-06-02 DIAGNOSIS — E119 Type 2 diabetes mellitus without complications: Secondary | ICD-10-CM

## 2021-06-16 ENCOUNTER — Ambulatory Visit (INDEPENDENT_AMBULATORY_CARE_PROVIDER_SITE_OTHER): Payer: Medicare HMO | Admitting: Internal Medicine

## 2021-06-16 ENCOUNTER — Encounter: Payer: Self-pay | Admitting: Internal Medicine

## 2021-06-16 ENCOUNTER — Other Ambulatory Visit: Payer: Self-pay

## 2021-06-16 VITALS — BP 122/72 | HR 62 | Ht 62.0 in | Wt 171.0 lb

## 2021-06-16 DIAGNOSIS — R194 Change in bowel habit: Secondary | ICD-10-CM

## 2021-06-16 DIAGNOSIS — R300 Dysuria: Secondary | ICD-10-CM | POA: Diagnosis not present

## 2021-06-16 DIAGNOSIS — R21 Rash and other nonspecific skin eruption: Secondary | ICD-10-CM | POA: Diagnosis not present

## 2021-06-16 LAB — POCT URINALYSIS DIPSTICK
Bilirubin, UA: NEGATIVE
Blood, UA: NEGATIVE
Glucose, UA: NEGATIVE
Ketones, UA: NEGATIVE
Leukocytes, UA: NEGATIVE
Nitrite, UA: NEGATIVE
Protein, UA: POSITIVE — AB
Spec Grav, UA: 1.02 (ref 1.010–1.025)
Urobilinogen, UA: 0.2 E.U./dL
pH, UA: 6 (ref 5.0–8.0)

## 2021-06-16 LAB — POCT WET PREP WITH KOH
Clue Cells Wet Prep HPF POC: NEGATIVE
KOH Prep POC: NEGATIVE
RBC Wet Prep HPF POC: 0
Trichomonas, UA: NEGATIVE
WBC Wet Prep HPF POC: 0
Yeast Wet Prep HPF POC: NEGATIVE

## 2021-06-16 NOTE — Progress Notes (Signed)
Date:  06/16/2021   Name:  Samantha Lang   DOB:  1941-10-24   MRN:  [id number]   Chief Complaint: Follow-up, Diabetes (Patient does not check blood sugars, so does not know readings), Abdominal Pain (History of diverticulosis; mucus in stool; x2 weeks; intermittent; has increased fiber and water in diet per patient), and Dysuria (Burning and pain with urination, patient thinks she has a yeast infection)  Dysuria  This is a new problem. The problem occurs intermittently. The quality of the pain is described as burning. The pain is mild. There has been no fever. Pertinent negatives include no chills, discharge, flank pain or hematuria. She has tried increased fluids for the symptoms. hx of diverticulitis  Rash This is a new problem. The affected locations include the left buttock. The rash is characterized by itchiness and scaling. She was exposed to nothing. Associated symptoms include diarrhea. Pertinent negatives include no shortness of breath.  Diarrhea  This is a recurrent problem. The problem occurs less than 2 times per day. The problem has been waxing and waning. The stool consistency is described as Mucous. The patient states that diarrhea does not awaken her from sleep. Pertinent negatives include no chills or headaches. Nothing aggravates the symptoms. hx of diverticulitis   Lab Results  Component Value Date   CREATININE 1.00 11/23/2020   BUN 21 11/23/2020   NA 139 11/23/2020   K 3.6 11/23/2020   CL 96 11/23/2020   CO2 27 11/23/2020   Lab Results  Component Value Date   CHOL 165 07/14/2020   HDL 59 07/14/2020   LDLCALC 84 07/14/2020   LDLDIRECT 138.3 09/26/2011   TRIG 126 07/14/2020   CHOLHDL 2.8 07/14/2020   Lab Results  Component Value Date   TSH 5.100 (H) 07/14/2020   Lab Results  Component Value Date   HGBA1C 7.1 (A) 03/19/2021   Lab Results  Component Value Date   WBC 5.2 07/14/2020   HGB 12.7 07/14/2020   HCT 37.1 07/14/2020   MCV 91 07/14/2020   PLT  208 07/14/2020   Lab Results  Component Value Date   ALT 29 11/23/2020   AST 26 11/23/2020   ALKPHOS 89 11/23/2020   BILITOT 0.5 11/23/2020     Review of Systems  Constitutional:  Negative for chills.  Respiratory:  Negative for chest tightness and shortness of breath.   Cardiovascular:  Negative for chest pain, palpitations and leg swelling.  Gastrointestinal:  Positive for diarrhea.  Genitourinary:  Positive for dysuria. Negative for flank pain and hematuria.  Skin:  Positive for rash.  Neurological:  Negative for dizziness, light-headedness and headaches.  Psychiatric/Behavioral:  Negative for dysphoric mood and sleep disturbance. The patient is not nervous/anxious.    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   LVH (left ventricular hypertrophy) due to hypertensive disease, without heart failure 04/11/2019   Foot pain, right 10/10/2018   Ganglion cyst of left foot 08/29/2018   Xerosis of skin 08/29/2018   Palpitations 08/08/2018   Colon cancer screening    Trochanteric bursitis of left hip 12/07/2016   History of endometrial cancer 08/03/2016   Edema leg 04/13/2016   Type II diabetes mellitus with complication ([place]) 01/60/1093   Shoulder pain, left 02/12/2016   Acid reflux 10/02/2015   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   Hyperlipidemia associated with type 2 diabetes mellitus ([place]) 04/08/2015   Aortic heart valve narrowing 03/26/2015   Bilateral carotid artery stenosis 03/26/2015   Anxiety    Environmental and seasonal allergies    Postmenopausal atrophic vaginitis    Depression, major, recurrent, moderate (HCC)    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 Rosaria Ferries, eyelid lift   POLYPECTOMY  09/22/2017   Procedure: POLYPECTOMY;  Surgeon: Lucilla Lame, MD;  Location: [facility];  Service: Gastroenterology;;   TUBAL LIGATION      Social History   Tobacco Use   Smoking status: Former    Packs/day: 0.25    Years: 20.00    Pack years: 5.00    Types: Cigarettes    Quit date: 09/25/1988    Years since quitting: 32.7   Smokeless tobacco: Never  Vaping Use   Vaping Use: Never used  Substance Use Topics   Alcohol use: Yes    Alcohol/week: 6.0 standard drinks    Types: 2 Glasses of wine, 2 Cans of beer, 2 Shots of liquor per week   Drug use: Never     Medication list has been reviewed and updated.  Current Meds  Medication Sig   allopurinol (ZYLOPRIM) 100 MG tablet Take 1 tablet (100 mg total) by mouth daily.   amLODipine (NORVASC) 5 MG tablet TAKE 1 TABLET BY MOUTH TWICE A DAY   aspirin 81 MG tablet Take 81 mg by mouth daily.   atorvastatin (LIPITOR) 10 MG tablet Take 1 tablet by mouth daily.   carvedilol (COREG) 6.25 MG tablet Take 6.25 mg by mouth 2 (two) times daily.   colchicine 0.6 MG tablet Take 0.6 mg by mouth daily. PRN only, takes rarely   JANUVIA 100 MG tablet TAKE 1 TABLET BY MOUTH EVERY DAY   Multiple Vitamin (MULTIVITAMIN) capsule Take 1 capsule by mouth daily.   pantoprazole (PROTONIX) 40 MG tablet TAKE 1 TABLET BY MOUTH EVERY DAY   psyllium (METAMUCIL SMOOTH TEXTURE) 58.6 % powder  Please use one does every other day.   telmisartan-hydrochlorothiazide (MICARDIS HCT) 80-25 MG tablet Take 1 tablet by mouth daily.   venlafaxine XR (EFFEXOR-XR) 150 MG 24 hr capsule TAKE (1) CAPSULE BY MOUTH EVERY DAY   VITAMIN D PO Take 5,000 Units by mouth daily.    PHQ 2/9 Scores 06/16/2021 03/19/2021 01/13/2021 11/23/2020  PHQ - 2 Score 4 4 0 0  PHQ- 9 Score 15 6 - 4    GAD 7 : Generalized Anxiety Score 06/16/2021 03/19/2021 11/23/2020 07/14/2020  Nervous, Anxious, on Edge 0 0 0 0  Control/stop worrying 0 0 0 1  Worry too much - different things 0 0 0 1  Trouble relaxing 0 0 0 0  Restless 0 0 0 0  Easily annoyed or irritable 0 0 0 0  Afraid - awful might happen 0 0 0 0  Total GAD 7 Score 0 0 0 2  Anxiety Difficulty Not difficult at all Not difficult at all - Not difficult at all    BP Readings from Last 3 Encounters:  06/16/21 122/72  03/19/21 140/86  11/23/20 139/60    Physical Exam Vitals and nursing note reviewed.  Constitutional:      General: She is not in acute distress.    Appearance: She is well-developed.  HENT:     Head: Normocephalic and atraumatic.  Cardiovascular:     Rate and Rhythm: Normal rate and regular rhythm.  Pulmonary:     Effort: Pulmonary effort is normal. No respiratory distress.  Abdominal:     General: Bowel sounds are increased.     Palpations: Abdomen is soft. There is no hepatomegaly or splenomegaly.     Tenderness: There is no abdominal tenderness. There is no right CVA tenderness or left CVA tenderness.  Skin:    General: Skin is warm and dry.     Findings: Lesion present. No rash.          Comments: Area of tiny excoriation on left buttock  Neurological:     Mental Status: She is alert and oriented to person, place, and time.  Psychiatric:        Mood and Affect: Mood normal.        Behavior: Behavior normal.    Wt Readings from Last 3 Encounters:  06/16/21 171 lb (77.6 kg)  03/19/21 169 lb (76.7 kg)  11/23/20 172 lb (78 kg)     BP 122/72 (BP Location: Right Arm, Patient Position: Sitting, Cuff Size: Normal)   Pulse 62   Ht 5\' 2"  (1.575 m)   Wt 171 lb (77.6 kg)   SpO2 97%   BMI 31.28 kg/m   Assessment and Plan: 1. Change in bowel habits Suspect mild IBS Recommend yogurt with active cultures and probiotic capules  2. Rash and nonspecific skin eruption Apply topical TAO  3. Dysuria No evidence of UTI or vaginal infection - POCT Wet Prep with KOH - POCT urinalysis dipstick   Partially dictated using Editor, commissioning. Any errors are unintentional.  Halina Maidens, MD [facility]  06/16/2021

## 2021-06-16 NOTE — Patient Instructions (Signed)
Use anti biotic ointment on rash

## 2021-06-23 DIAGNOSIS — S6992XA Unspecified injury of left wrist, hand and finger(s), initial encounter: Secondary | ICD-10-CM | POA: Diagnosis not present

## 2021-06-23 DIAGNOSIS — M79642 Pain in left hand: Secondary | ICD-10-CM | POA: Diagnosis not present

## 2021-06-23 DIAGNOSIS — M7989 Other specified soft tissue disorders: Secondary | ICD-10-CM | POA: Diagnosis not present

## 2021-06-23 DIAGNOSIS — S80211A Abrasion, right knee, initial encounter: Secondary | ICD-10-CM | POA: Diagnosis not present

## 2021-06-23 DIAGNOSIS — W010XXA Fall on same level from slipping, tripping and stumbling without subsequent striking against object, initial encounter: Secondary | ICD-10-CM | POA: Diagnosis not present

## 2021-06-23 DIAGNOSIS — W548XXA Other contact with dog, initial encounter: Secondary | ICD-10-CM | POA: Diagnosis not present

## 2021-06-23 DIAGNOSIS — S62525A Nondisplaced fracture of distal phalanx of left thumb, initial encounter for closed fracture: Secondary | ICD-10-CM | POA: Diagnosis not present

## 2021-06-23 DIAGNOSIS — M189 Osteoarthritis of first carpometacarpal joint, unspecified: Secondary | ICD-10-CM | POA: Diagnosis not present

## 2021-06-23 DIAGNOSIS — S62525B Nondisplaced fracture of distal phalanx of left thumb, initial encounter for open fracture: Secondary | ICD-10-CM | POA: Diagnosis not present

## 2021-07-01 DIAGNOSIS — S62525A Nondisplaced fracture of distal phalanx of left thumb, initial encounter for closed fracture: Secondary | ICD-10-CM | POA: Diagnosis not present

## 2021-07-06 ENCOUNTER — Telehealth: Payer: Self-pay

## 2021-07-06 NOTE — Telephone Encounter (Unsigned)
Copied from [place] [id number]. Topic: General - Inquiry >> Jul 06, 2021 10:14 AM Greggory Keen D wrote: Reason for CRM: pt called saying she would like to see a neurologist for falling. She said she has been falling a lot.  CB#  [phone]

## 2021-07-09 ENCOUNTER — Encounter: Payer: Self-pay | Admitting: Internal Medicine

## 2021-07-09 ENCOUNTER — Other Ambulatory Visit: Payer: Self-pay

## 2021-07-09 ENCOUNTER — Ambulatory Visit (INDEPENDENT_AMBULATORY_CARE_PROVIDER_SITE_OTHER): Payer: Medicare HMO | Admitting: Internal Medicine

## 2021-07-09 VITALS — BP 154/64 | HR 77 | Temp 98.1°F | Ht 62.0 in | Wt 169.0 lb

## 2021-07-09 DIAGNOSIS — F331 Major depressive disorder, recurrent, moderate: Secondary | ICD-10-CM

## 2021-07-09 DIAGNOSIS — E1169 Type 2 diabetes mellitus with other specified complication: Secondary | ICD-10-CM | POA: Diagnosis not present

## 2021-07-09 DIAGNOSIS — I10 Essential (primary) hypertension: Secondary | ICD-10-CM | POA: Diagnosis not present

## 2021-07-09 DIAGNOSIS — E785 Hyperlipidemia, unspecified: Secondary | ICD-10-CM

## 2021-07-09 DIAGNOSIS — G3184 Mild cognitive impairment, so stated: Secondary | ICD-10-CM

## 2021-07-09 DIAGNOSIS — E118 Type 2 diabetes mellitus with unspecified complications: Secondary | ICD-10-CM | POA: Diagnosis not present

## 2021-07-09 DIAGNOSIS — R296 Repeated falls: Secondary | ICD-10-CM | POA: Diagnosis not present

## 2021-07-09 DIAGNOSIS — R69 Illness, unspecified: Secondary | ICD-10-CM | POA: Diagnosis not present

## 2021-07-09 NOTE — Progress Notes (Signed)
Date:  07/09/2021   Name:  Samantha Lang   DOB:  [birth year]   MRN:  [id number]   Chief Complaint: Fall (Balance issues, frequent falls - wants to see a neurologist. 3 falls of the last year. No dizziness, and faint feelings. She just falls. )  Hypertension This is a chronic problem. The problem is controlled. Pertinent negatives include no blurred vision, chest pain, headaches or shortness of breath. Past treatments include calcium channel blockers, angiotensin blockers and diuretics. The current treatment provides moderate improvement. Compliance problems include exercise.   Diabetes She presents for her follow-up diabetic visit. She has type 2 diabetes mellitus. Her disease course has been stable. Hypoglycemia symptoms include nervousness/anxiousness. Pertinent negatives for hypoglycemia include no confusion, dizziness or headaches. Pertinent negatives for diabetes include no blurred vision, no chest pain, no fatigue, no foot paresthesias and no visual change. There are no hypoglycemic complications. Symptoms are stable.  Depression        This is a chronic problem.The problem is unchanged.  Associated symptoms include no fatigue, no appetite change and no headaches.     The symptoms are aggravated by social issues.  Past treatments include SNRIs - Serotonin and norepinephrine reuptake inhibitors.  Compliance with treatment is good.  Previous treatment provided moderate relief. Falls - she has had three provoked falls in the past year.  The first fall was last November at home with no injury.  This occurred when one of her dogs suddenly decided to pull quickly on the leash and caused her to lose her balance.  The second occurred when she was walking up the steps into her house and caught her toe on the edge of the stair she fell forward and injured her rotator cuff.  The third fall was recently walking the dogs got tripped up by the leash and fell over and fractured her distal thumb.  Overall  she is concerned that she is falling frequently but admits that her main concern is her inability to regain her balance what she starts to go.  She admits to doing little in the way of regular exercise.  She denies dizziness, lightheadedness, palpitations, or other symptoms preceding the episodes.  MCI -over the last several years she has had mild issues with memory.  Her screenings have been between 4 and 7 on the 6 CIT.  She has had several friends and family members recently with declining mental status and she is concerned that she wants to avoid those complications if possible.  She is not getting out of the house much.  Her closest friend was recently admitted to a nursing home with a stroke and is just not the same person.  She is considering requesting a neurology referral but today agreed to some additional chemical testing.  Lab Results  Component Value Date   CREATININE 1.00 11/23/2020   BUN 21 11/23/2020   NA 139 11/23/2020   K 3.6 11/23/2020   CL 96 11/23/2020   CO2 27 11/23/2020   Lab Results  Component Value Date   CHOL 165 07/14/2020   HDL 59 07/14/2020   LDLCALC 84 07/14/2020   LDLDIRECT 138.3 09/26/2011   TRIG 126 07/14/2020   CHOLHDL 2.8 07/14/2020   Lab Results  Component Value Date   TSH 5.100 (H) 07/14/2020   Lab Results  Component Value Date   HGBA1C 7.1 (A) 03/19/2021   Lab Results  Component Value Date   WBC 5.2 07/14/2020   HGB 12.7 07/14/2020  HCT 37.1 07/14/2020   MCV 91 07/14/2020   PLT 208 07/14/2020   Lab Results  Component Value Date   ALT 29 11/23/2020   AST 26 11/23/2020   ALKPHOS 89 11/23/2020   BILITOT 0.5 11/23/2020     Review of Systems  Constitutional:  Negative for appetite change, chills, fatigue, fever and unexpected weight change.  HENT:  Negative for trouble swallowing.   Eyes:  Negative for blurred vision.  Respiratory:  Negative for cough, chest tightness, shortness of breath and wheezing.   Cardiovascular:  Negative  for chest pain and leg swelling.  Musculoskeletal:  Positive for arthralgias.  Neurological:  Negative for dizziness, syncope, light-headedness, numbness and headaches.  Psychiatric/Behavioral:  Positive for depression, dysphoric mood and sleep disturbance. Negative for agitation and confusion. The patient is nervous/anxious.    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   LVH (left ventricular hypertrophy) due to hypertensive disease, without heart failure 04/11/2019   Foot pain, right 10/10/2018   Ganglion cyst of left foot 08/29/2018   Xerosis of skin 08/29/2018   Palpitations 08/08/2018   Colon cancer screening    Trochanteric bursitis of left hip 12/07/2016   History of endometrial cancer 08/03/2016   Edema leg 04/13/2016   Type II diabetes mellitus with complication ([place]) 65/02/5464   Shoulder pain, left 02/12/2016   Acid reflux 10/02/2015   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   Hyperlipidemia associated with type 2 diabetes mellitus ([place]) 04/08/2015   Aortic heart valve narrowing 03/26/2015   Bilateral carotid artery stenosis 03/26/2015   Anxiety    Environmental and seasonal allergies    Postmenopausal atrophic vaginitis    Depression, major, recurrent, moderate (HCC)    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 Rosaria Ferries, eyelid lift   POLYPECTOMY  09/22/2017   Procedure: POLYPECTOMY;  Surgeon: Lucilla Lame, MD;  Location: [facility];  Service: Gastroenterology;;   TUBAL LIGATION      Social History   Tobacco Use   Smoking status: Former    Packs/day: 0.25    Years: 20.00    Pack years: 5.00    Types: Cigarettes    Quit date: 09/25/1988    Years since quitting: 32.8   Smokeless tobacco: Never  Vaping Use   Vaping Use: Never used  Substance Use Topics   Alcohol use: Yes    Alcohol/week: 6.0 standard drinks    Types: 2 Glasses of wine, 2 Cans of beer, 2 Shots of liquor per week   Drug use: Never     Medication list has been reviewed and updated.  Current Meds  Medication Sig   allopurinol (ZYLOPRIM) 100 MG tablet Take 1 tablet (100 mg total) by mouth daily.   amLODipine (NORVASC) 5 MG tablet TAKE 1 TABLET BY MOUTH TWICE A DAY   aspirin 81 MG tablet Take 81 mg by mouth daily.   atorvastatin (LIPITOR) 10 MG tablet Take 1 tablet by mouth daily.   carvedilol (COREG) 6.25 MG tablet Take 6.25 mg by mouth 2 (two) times daily.   colchicine 0.6 MG tablet Take 0.6 mg by mouth daily. PRN only, takes rarely   JANUVIA 100 MG tablet TAKE 1 TABLET BY MOUTH EVERY DAY   Multiple Vitamin (MULTIVITAMIN) capsule Take 1 capsule by mouth daily.   pantoprazole (PROTONIX) 40 MG tablet TAKE 1 TABLET BY MOUTH EVERY DAY   psyllium (METAMUCIL SMOOTH TEXTURE) 58.6 % powder Please use one does every other day.   telmisartan-hydrochlorothiazide (MICARDIS HCT) 80-25 MG tablet Take 1 tablet by mouth daily.   venlafaxine XR (EFFEXOR-XR) 150 MG 24 hr capsule TAKE (1) CAPSULE BY MOUTH EVERY DAY   VITAMIN D PO Take 5,000 Units by mouth daily.    PHQ 2/9 Scores 07/09/2021 06/16/2021 03/19/2021 01/13/2021  PHQ - 2 Score 4 4 4  0  PHQ- 9 Score  11 15 6  -    GAD 7 : Generalized Anxiety Score 07/09/2021 06/16/2021 03/19/2021 11/23/2020  Nervous, Anxious, on Edge 0 0 0 0  Control/stop worrying 0 0 0 0  Worry too much - different things 0 0 0 0  Trouble relaxing 0 0 0 0  Restless 0 0 0 0  Easily annoyed or irritable 0 0 0 0  Afraid - awful might happen 0 0 0 0  Total GAD 7 Score 0 0 0 0  Anxiety Difficulty - Not difficult at all Not difficult at all -   6CIT Screen 01/28/2019 11/13/2017  What Year? 0 points 0 points  What month? 0 points 0 points  What time? 0 points 3 points  Count back from 20 0 points 0 points  Months in reverse 0 points 0 points  Repeat phrase 4 points 4 points  Total Score 4 7      BP Readings from Last 3 Encounters:  07/09/21 (!) 154/64  06/16/21 122/72  03/19/21 140/86    Physical Exam Vitals and nursing note reviewed.  Constitutional:      General: She is not in acute distress.    Appearance: Normal appearance. She is well-developed.  HENT:     Head: Normocephalic and atraumatic.  Neck:     Vascular: No carotid bruit.  Cardiovascular:     Rate and Rhythm: Normal rate and regular rhythm.     Heart sounds: No murmur heard. Pulmonary:     Effort: Pulmonary effort is normal. No respiratory distress.     Breath sounds: No wheezing or rhonchi.  Musculoskeletal:     Cervical back: Normal range of motion.     Right lower leg: No edema.     Left lower leg: No edema.  Lymphadenopathy:     Cervical: No cervical adenopathy.  Skin:    General: Skin is warm and dry.     Capillary Refill: Capillary refill takes less than 2 seconds.     Findings: No rash.  Neurological:     General: No focal deficit present.     Mental Status: She is alert and oriented to person, place, and time.     Sensory: No sensory deficit.     Motor: No  weakness.     Gait: Gait normal.  Psychiatric:        Attention and Perception: Attention normal.        Mood and Affect: Mood normal.        Speech: Speech normal.         Behavior: Behavior normal.        Thought Content: Thought content normal.        Judgment: Judgment normal.    Wt Readings from Last 3 Encounters:  07/09/21 169 lb (76.7 kg)  06/16/21 171 lb (77.6 kg)  03/19/21 169 lb (76.7 kg)    BP (!) 154/64 (BP Location: Right Arm, Cuff Size: Large)   Pulse 77   Temp 98.1 F (36.7 C) (Oral)   Ht 5\' 2"  (1.575 m)   Wt 169 lb (76.7 kg)   SpO2 97%   BMI 30.91 kg/m   Assessment and Plan: 1. Frequent falls These have been provoked but she has also not been able to recover once she is off balance. - Ambulatory referral to Physical Therapy  2. MCI (mild cognitive impairment) Will obtain some screening labs. Consider referral to Neuro - Vitamin B12 - TSH - Sedimentation rate  3. Type II diabetes mellitus with complication (HCC) Clinically stable by exam and report without s/s of hypoglycemia. DM complicated by hypertension and dyslipidemia. Tolerating medications well without side effects or other concerns. - Hemoglobin A1c  4. Hyperlipidemia associated with type 2 diabetes mellitus ([place]) - Lipid panel  5. Essential hypertension BP fairly well controlled Continue current regimen, recommend more regular, safe exercise - CBC with Differential/Platelet  6. Depression, major, recurrent, moderate ([place]) Still fairly sx.  She is not interested in higher dose medication. She would benefit greatly from genetic testing to help adjust her medication Encourage her to get out of the house at least 1-2 times per week for socialization.   Partially dictated using Editor, commissioning. Any errors are unintentional.  Halina Maidens, MD [facility]  07/09/2021

## 2021-07-10 LAB — CBC WITH DIFFERENTIAL/PLATELET
Basophils Absolute: 0.1 10*3/uL (ref 0.0–0.2)
Basos: 1 %
EOS (ABSOLUTE): 0.2 10*3/uL (ref 0.0–0.4)
Eos: 3 %
Hematocrit: 38.5 % (ref 34.0–46.6)
Hemoglobin: 13.3 g/dL (ref 11.1–15.9)
Immature Grans (Abs): 0.1 10*3/uL (ref 0.0–0.1)
Immature Granulocytes: 1 %
Lymphocytes Absolute: 2 10*3/uL (ref 0.7–3.1)
Lymphs: 27 %
MCH: 30.9 pg (ref 26.6–33.0)
MCHC: 34.5 g/dL (ref 31.5–35.7)
MCV: 90 fL (ref 79–97)
Monocytes Absolute: 0.6 10*3/uL (ref 0.1–0.9)
Monocytes: 9 %
Neutrophils Absolute: 4.5 10*3/uL (ref 1.4–7.0)
Neutrophils: 59 %
Platelets: 152 10*3/uL (ref 150–450)
RBC: 4.3 x10E6/uL (ref 3.77–5.28)
RDW: 12.9 % (ref 11.7–15.4)
WBC: 7.4 10*3/uL (ref 3.4–10.8)

## 2021-07-10 LAB — LIPID PANEL
Chol/HDL Ratio: 2.5 ratio (ref 0.0–4.4)
Cholesterol, Total: 159 mg/dL (ref 100–199)
HDL: 64 mg/dL (ref >39)
LDL Chol Calc (NIH): 65 mg/dL (ref 0–99)
Triglycerides: 181 mg/dL — ABNORMAL HIGH (ref 0–149)
VLDL Cholesterol Cal: 30 mg/dL (ref 5–40)

## 2021-07-10 LAB — HEMOGLOBIN A1C
Est. average glucose Bld gHb Est-mCnc: 160 mg/dL
Hgb A1c MFr Bld: 7.2 % — ABNORMAL HIGH (ref 4.8–5.6)

## 2021-07-10 LAB — VITAMIN B12: Vitamin B-12: 432 pg/mL (ref 232–1245)

## 2021-07-10 LAB — TSH: TSH: 3.19 u[IU]/mL (ref 0.450–4.500)

## 2021-07-10 LAB — SEDIMENTATION RATE: Sed Rate: 13 mm/hr (ref 0–40)

## 2021-07-27 ENCOUNTER — Encounter: Payer: Self-pay | Admitting: Physical Therapy

## 2021-07-27 ENCOUNTER — Ambulatory Visit: Payer: Medicare HMO | Attending: Internal Medicine | Admitting: Physical Therapy

## 2021-07-27 ENCOUNTER — Other Ambulatory Visit: Payer: Self-pay

## 2021-07-27 DIAGNOSIS — M6281 Muscle weakness (generalized): Secondary | ICD-10-CM

## 2021-07-27 DIAGNOSIS — R2681 Unsteadiness on feet: Secondary | ICD-10-CM

## 2021-07-28 NOTE — Patient Instructions (Signed)
Access Code: [email or web address] URL: [email or web address] Date: 07/28/2021 Prepared by: Dorcas Carrow  Exercises Tandem Stance in Corner - 1 x daily - 7 x weekly - 3 sets - 10 reps Single Leg Stance - 1 x daily - 7 x weekly - 3 sets - 10 reps Mini Squat with Chair - 1 x daily - 7 x weekly - 3 sets - 10 reps

## 2021-07-28 NOTE — Therapy (Signed)
[facility] [address] [place], [place], [address] Phone: [phone]   Fax:  [phone]  Physical Therapy Evaluation  Patient Details  Name: Samantha Lang MRN: [id number] Date of Birth: 04/21/1941 Referring Provider (PT): Halina Maidens MD   Encounter Date: 07/27/2021   PT End of Session - 07/28/21 0841     Visit Number 1    Number of Visits 16    Date for PT Re-Evaluation 09/22/21    Authorization - Visit Number 1    Authorization - Number of Visits 10    PT Start Time [id number]    PT Stop Time 1620    PT Time Calculation (min) 65 min    Activity Tolerance Patient tolerated treatment well;No increased pain    Behavior During Therapy WFL for tasks assessed/performed             Past Medical History:  Diagnosis Date   Allergy    Anxiety    Aortic valve stenosis, mild    Depression    mild   Diabetes mellitus age 80   GERD (gastroesophageal reflux disease)    Heart murmur    Hemorrhoids    Hyperlipidemia    Hypertension 80   Incontinence    Female stress   Mitral incompetence    Personal history of chemotherapy    3 treatments   Personal history of radiation therapy    5 treatments   Postmenopausal atrophic vaginitis    Torn rotator cuff    Uterine cancer [facility])    treated    Past Surgical History:  Procedure Laterality Date   ABDOMINAL HYSTERECTOMY  07/2016   BREAST BIOPSY Right 03/21/08   right, benign    COLONOSCOPY WITH PROPOFOL N/A 09/22/2017   Procedure: COLONOSCOPY WITH PROPOFOL;  Surgeon: Lucilla Lame, MD;  Location: [facility];  Service: Gastroenterology;  Laterality: N/A;  diabetic   EYE SURGERY  Oct 2009   for ptosis,  Dr. Rosaria Ferries, eyelid lift   POLYPECTOMY  09/22/2017   Procedure: POLYPECTOMY;  Surgeon: Lucilla Lame, MD;  Location: [place];  Service: Gastroenterology;;   TUBAL LIGATION      There were no vitals filed for this visit.    Subjective Assessment -  07/27/21 1811     Subjective Pt. presents to session with chief complaint of frequent falls. She has had three provoked falls in the past year. The first fall was last November at home with no injury.  This occurred when one of her dogs suddenly decided to pull quickly on the leash and caused her to lose her balance. The second fall occurred when she was walking up the steps into her house and caught her toe on the edge of the stair and fell forward/ injured her rotator cuff.  The third fall was recently walking the dogs and got tripped up by the leash resulting in a  fall/fractured distal thumb.    Pertinent History R Rotator cuff injury d/t fall in 09/2021;    Limitations House hold activities;Standing;Walking    How long can you sit comfortably? As needed    How long can you stand comfortably? As needed    How long can you walk comfortably? less than 30 minute    Patient Stated Goals To walk without fall    Currently in Pain? No/denies                [facility] PT Assessment - 07/28/21 0001  Assessment   Medical Diagnosis Frequent Fall    Referring Provider (PT) Halina Maidens MD    Onset Date/Surgical Date 10/04/20    Hand Dominance Right    Next MD Visit n/a    Prior Therapy yes      Precautions   Precautions Fall      Restrictions   Weight Bearing Restrictions No      Balance Screen   Has the patient fallen in the past 6 months Yes    How many times? 3    Has the patient had a decrease in activity level because of a fear of falling?  Yes      [place] residence      Prior Function   Level of Independence Independent      Cognition   Overall Cognitive Status Within Functional Limits for tasks assessed      Coordination   Gross Motor Movements are Fluid and Coordinated No      Functional Tests   Functional tests Squat      Functional Gait  Assessment   Gait assessed  Yes    Gait Level Surface Walks 20 ft in less than  5.5 sec, no assistive devices, good speed, no evidence for imbalance, normal gait pattern, deviates no more than 6 in outside of the 12 in walkway width.    Change in Gait Speed Able to smoothly change walking speed without loss of balance or gait deviation. Deviate no more than 6 in outside of the 12 in walkway width.    Gait with Horizontal Head Turns Performs head turns smoothly with no change in gait. Deviates no more than 6 in outside 12 in walkway width    Gait with Vertical Head Turns Performs head turns with no change in gait. Deviates no more than 6 in outside 12 in walkway width.    Gait and Pivot Turn Pivot turns safely in greater than 3 sec and stops with no loss of balance, or pivot turns safely within 3 sec and stops with mild imbalance, requires small steps to catch balance.    Step Over Obstacle Is able to step over one shoe box (4.5 in total height) without changing gait speed. No evidence of imbalance.    Gait with Narrow Base of Support Is able to ambulate for 10 steps heel to toe with no staggering.    Gait with Eyes Closed Walks 20 ft, no assistive devices, good speed, no evidence of imbalance, normal gait pattern, deviates no more than 6 in outside 12 in walkway width. Ambulates 20 ft in less than 7 sec.    Ambulating Backwards Walks 20 ft, uses assistive device, slower speed, mild gait deviations, deviates 6-10 in outside 12 in walkway width.    Steps Two feet to a stair, must use rail.    Total Score 25              OBJECTIVE  MUSCULOSKELETAL: Tremor: Absent Bulk: Normal Tone: Normal, no clonus  Posture Forward head posture by 5cm  Gait No gross abnormalities in gait noted  Strength R/L 3+/3+ Hip flexion 4/4 Hip abduction 4/4 Hip adduction 4/4Knee extension 4/4 Knee flexion 5/5 Ankle Plantarflexion 5/5 Ankle Dorsiflexion  ROM  Shoulder, Hip, Knee  WNL, except R shoulder flexion--pain at end ROM.  NEUROLOGICAL:  Mental Status Patient is  oriented to person, place and time.  Recent memory is intact.  Remote memory is intact.  Attention span and  concentration are intact.  Expressive speech is intact.  Patient's fund of knowledge is within normal limits for educational level.    Sensation Grossly intact to light touch bilateral UEs/LEs as determined by testing dermatomes C2-T2/L2-S2 respectively Proprioception and hot/cold testing deferred on this date  Reflexes R/L 2+/2+ Knee Jerk (L3/4) 2+/2+ Ankle Jerk (S1/2)    FUNCTIONAL OUTCOME MEASURES   Results Comments  FGA 25/30   5TSTS 13 seconds   6 Minute Walk Test TBD   SLS L-R less than 15s   Tandem L-R less than 18s         Objective measurements completed on examination: See above findings.     HEP issued      PT Education - 07/28/21 0841     Education Details Access Code: [email or web address]    Person(s) Educated Patient    Methods Explanation;Demonstration;Verbal cues    Comprehension Verbalized understanding;Returned demonstration                 PT Long Term Goals - 07/28/21 1000       PT LONG TERM GOAL #1   Title Pt will be independent with HEP in order to improve strength and balance in order to decrease fall risk and improve function at home.    Baseline IE: 8/3 no HEP    Time 8    Period Weeks    Status New    Target Date 09/22/21      PT LONG TERM GOAL #2   Title Pt will improve FGA by at least 3 points in order to demonstrate clinically significant improvement in balance and decreased risk for falls.    Baseline IE: FGA: 25    Time 8    Period Weeks    Status New    Target Date 09/22/21      PT LONG TERM GOAL #3   Title Pt will decrease 5xSTS by at least 3 seconds in order to demonstrate clinically significant improvement in LE strength.    Baseline IE: 8/2, 13 seconds    Time 8    Period Weeks    Status New    Target Date 09/22/21      PT LONG TERM GOAL #4   Title Pt will improve B LE MMT by 1/2 grade to facilitate  stair clearance and ensure safety.    Baseline IE: Strength  R/L  3+/3+ Hip flexion  4/4 Hip abduction  4/4 Hip adduction  4/4Knee extension  4/4 Knee flexion    Time 8    Period Weeks    Status New    Target Date 09/22/21      PT LONG TERM GOAL #5   Title Pt. will increase FOTO to 56 to improve functional mobility/ safety.    Baseline Initial FOTO: 45    Time 8    Period Weeks    Status New    Target Date 09/22/21                    Plan - 07/28/21 0957     Clinical Impression Statement Pt. is a pleasant 80 year-old female referred for difficulty with balance/ gait.  PT examination reveals deficits in B LE strength, balance and stair negotiation. B LE MMT reveals overall 4/5 MMT with exception of 3+/5 for B hip flexion, FGA 25/30. Per clinical findings, strength deficits may contribute to limitation with stair negotiation. Pt displayed hesitation and required UE support with stair climbing. Balance deficits revealed as  indicated by FGA: 25/30, 5TSTS: 13 seconds, SLS: L-R less than 15s, Tandem: L-R less than 18s. PT session will focus on strength and stair climbing along with balance recovery strategies. Pt. will benefit from skilled PT services to address deficits in balance and decrease risk for future falls.    Examination-Activity Limitations Stairs    Stability/Clinical Decision Making Stable/Uncomplicated    Clinical Decision Making Low    Rehab Potential Good    PT Frequency 2x / week    PT Duration 8 weeks    PT Treatment/Interventions Manual techniques;Passive range of motion;Therapeutic activities;Functional mobility training;Therapeutic exercise;Balance training;Neuromuscular re-education;Gait training;Stair training;Patient/family education;ADLs/Self Care Home Management    PT Next Visit Plan 6 minute walk test.  Reassess HEP    PT Home Exercise Plan Access Code: [email or web address]    Consulted and Agree with Plan of Care Patient             Patient will benefit from  skilled therapeutic intervention in order to improve the following deficits and impairments:  Decreased balance, Decreased activity tolerance, Decreased endurance, Postural dysfunction, Decreased mobility, Decreased strength, Difficulty walking  Visit Diagnosis: Muscle weakness (generalized)  Unsteadiness on feet     Problem List 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   LVH (left ventricular hypertrophy) due to hypertensive disease, without heart failure 04/11/2019   Foot pain, right 10/10/2018   Ganglion cyst of left foot 08/29/2018   Xerosis of skin 08/29/2018   Palpitations 08/08/2018   Colon cancer screening    Trochanteric bursitis of left hip 12/07/2016   History of endometrial cancer 08/03/2016   Edema leg 04/13/2016   Type II diabetes mellitus with complication ([place]) 123XX123   Shoulder pain, left 02/12/2016   Acid reflux 10/02/2015   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   Hyperlipidemia associated with type 2 diabetes mellitus ([place]) 04/08/2015   Aortic heart valve narrowing 03/26/2015   Bilateral carotid artery stenosis 03/26/2015   Anxiety    Environmental and seasonal allergies    Postmenopausal atrophic vaginitis    Depression, major, recurrent, moderate (HCC)    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 [place], [place], [address] Phone: [phone]   Fax:  [phone]  Name: KIYONA SCHECHTER MRN: [id number] Date of Birth: 1941-09-30

## 2021-07-29 DIAGNOSIS — S63642D Sprain of metacarpophalangeal joint of left thumb, subsequent encounter: Secondary | ICD-10-CM | POA: Diagnosis not present

## 2021-07-29 DIAGNOSIS — S62525A Nondisplaced fracture of distal phalanx of left thumb, initial encounter for closed fracture: Secondary | ICD-10-CM | POA: Diagnosis not present

## 2021-08-03 ENCOUNTER — Other Ambulatory Visit: Payer: Self-pay | Admitting: Internal Medicine

## 2021-08-03 ENCOUNTER — Ambulatory Visit: Payer: Medicare HMO | Admitting: Physical Therapy

## 2021-08-05 ENCOUNTER — Other Ambulatory Visit: Payer: Self-pay

## 2021-08-05 ENCOUNTER — Ambulatory Visit: Payer: Medicare HMO | Admitting: Physical Therapy

## 2021-08-05 ENCOUNTER — Encounter: Payer: Self-pay | Admitting: Physical Therapy

## 2021-08-05 DIAGNOSIS — R2681 Unsteadiness on feet: Secondary | ICD-10-CM | POA: Diagnosis not present

## 2021-08-05 DIAGNOSIS — M6281 Muscle weakness (generalized): Secondary | ICD-10-CM

## 2021-08-05 NOTE — Therapy (Signed)
[facility] [address]. [place], [place], [address] Phone: [phone]   Fax:  [phone]  Physical Therapy Treatment  Patient Details  Name: Samantha Lang MRN: [id number] Date of Birth: [birth year] Referring Provider (PT): Halina Maidens MD   Encounter Date: 08/05/2021   PT End of Session - 08/05/21 1859     Visit Number 2    Number of Visits 16    Date for PT Re-Evaluation 09/22/21    Authorization - Visit Number 2    Authorization - Number of Visits 10    PT Start Time [id number]    PT Stop Time [id number]    PT Time Calculation (min) 43 min    Activity Tolerance Patient tolerated treatment well;No increased pain    Behavior During Therapy WFL for tasks assessed/performed             Past Medical History:  Diagnosis Date   Allergy    Anxiety    Aortic valve stenosis, mild    Depression    mild   Diabetes mellitus age 37   GERD (gastroesophageal reflux disease)    Heart murmur    Hemorrhoids    Hyperlipidemia    Hypertension 2003   Incontinence    Female stress   Mitral incompetence    Personal history of chemotherapy    3 treatments   Personal history of radiation therapy    5 treatments   Postmenopausal atrophic vaginitis    Torn rotator cuff    Uterine cancer [facility])    treated    Past Surgical History:  Procedure Laterality Date   ABDOMINAL HYSTERECTOMY  07/2016   BREAST BIOPSY Right 03/21/08   right, benign    COLONOSCOPY WITH PROPOFOL N/A 09/22/2017   Procedure: COLONOSCOPY WITH PROPOFOL;  Surgeon: Lucilla Lame, MD;  Location: [place];  Service: Gastroenterology;  Laterality: N/A;  diabetic   EYE SURGERY  Oct 2009   for ptosis,  Dr. Rosaria Ferries, eyelid lift   POLYPECTOMY  09/22/2017   Procedure: POLYPECTOMY;  Surgeon: Lucilla Lame, MD;  Location: [place];  Service: Gastroenterology;;   TUBAL LIGATION      There were no vitals filed for this visit.   Subjective Assessment - 08/05/21  1538     Subjective Pt presents to session with R thumb cast d/t a fall 4 weeks ago, thumb cast received 08/04. No HEP compliance d/t loss of HEP paper.    Pertinent History R Rotator cuff injury d/t fall in 09/2021;    Limitations House hold activities;Standing;Walking    How long can you sit comfortably? As needed    How long can you stand comfortably? As needed    How long can you walk comfortably? less than 30 minute    Patient Stated Goals To walk without fall    Currently in Pain? No/denies            Nustep: S_7, L4, x10 minutes to promote tissue extensibility and circulation.    Treatement:   2 minute walk test: 398 ft  Neuro:  //bar:    Standing Airex: (shoe off)   1)Rhomberg  2)Tandem L-R 3x30s with head turn left to right 3)SLS L-R 3x30s   Obstacle:  Stepping over three 6inch bar with head turn left to right. X4 laps. Sitting breaks required each lap  Thera.ex  1) Lateral step out with GTB in //bar x4 laps, updated to HEP.       PT  Long Term Goals - 07/28/21 1000       PT LONG TERM GOAL #1   Title Pt will be independent with HEP in order to improve strength and balance in order to decrease fall risk and improve function at home.    Baseline IE: 8/3 no HEP    Time 8    Period Weeks    Status New    Target Date 09/22/21      PT LONG TERM GOAL #2   Title Pt will improve FGA by at least 3 points in order to demonstrate clinically significant improvement in balance and decreased risk for falls.    Baseline IE: FGA: 25    Time 8    Period Weeks    Status New    Target Date 09/22/21      PT LONG TERM GOAL #3   Title Pt will decrease 5xSTS by at least 3 seconds in order to demonstrate clinically significant improvement in LE strength.    Baseline IE: 8/2, 13 seconds    Time 8    Period Weeks    Status New    Target Date 09/22/21      PT LONG TERM GOAL #4   Title Pt will improve B LE MMT by 1/2 grade to facilitate stair clearance and ensure  safety.    Baseline IE: Strength  R/L  3+/3+ Hip flexion  4/4 Hip abduction  4/4 Hip adduction  4/4Knee extension  4/4 Knee flexion    Time 8    Period Weeks    Status New    Target Date 09/22/21      PT LONG TERM GOAL #5   Title Pt. will increase FOTO to 56 to improve functional mobility/ safety.    Baseline Initial FOTO: 45    Time 8    Period Weeks    Status New    Target Date 09/22/21                   Plan - 08/05/21 1913     Clinical Impression Statement Session focused on ambulation endurance assessment and dynamic/static balance training. Pt was able to ambulate 376f in 2 minute without resting breaks and LOB. Dynamic balance training was challenging to her as indicated UE support require with SLS. Pt completed training without increase of pain or soreness. Future session will focus on progressive dynamic balance training with ambulation.    Examination-Activity Limitations Stairs    Stability/Clinical Decision Making Stable/Uncomplicated    Clinical Decision Making Low    Rehab Potential Good    PT Frequency 2x / week    PT Duration 8 weeks    PT Treatment/Interventions Manual techniques;Passive range of motion;Therapeutic activities;Functional mobility training;Therapeutic exercise;Balance training;Neuromuscular re-education;Gait training;Stair training;Patient/family education;ADLs/Self Care Home Management    PT Next Visit Plan 6 minute walk test.  Reassess HEP    PT Home Exercise Plan Access Code: [email or web address]    Consulted and Agree with Plan of Care Patient             Patient will benefit from skilled therapeutic intervention in order to improve the following deficits and impairments:  Decreased balance, Decreased activity tolerance, Decreased endurance, Postural dysfunction, Decreased mobility, Decreased strength, Difficulty walking  Visit Diagnosis: Muscle weakness (generalized)  Unsteadiness on feet     Problem List Patient Active Problem List    Diagnosis Date Noted   Acquired trigger finger 01/13/2021   Radial styloid tenosynovitis 01/13/2021   Right rotator cuff  tear arthropathy 11/23/2020   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   LVH (left ventricular hypertrophy) due to hypertensive disease, without heart failure 04/11/2019   Foot pain, right 10/10/2018   Ganglion cyst of left foot 08/29/2018   Xerosis of skin 08/29/2018   Palpitations 08/08/2018   Colon cancer screening    Trochanteric bursitis of left hip 12/07/2016   History of endometrial cancer 08/03/2016   Edema leg 04/13/2016   Type II diabetes mellitus with complication ([place]) 123XX123   Shoulder pain, left 02/12/2016   Acid reflux 10/02/2015   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   Hyperlipidemia associated with type 2 diabetes mellitus ([place]) 04/08/2015   Aortic heart valve narrowing 03/26/2015   Bilateral carotid artery stenosis 03/26/2015   Anxiety    Environmental and seasonal allergies    Postmenopausal atrophic vaginitis    Depression, major, recurrent, moderate ([place])    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 [place], [place], [address] Phone: [phone]   Fax:  [phone]  Name: Samantha Lang MRN: [id number] Date of Birth: [birth year]

## 2021-08-10 ENCOUNTER — Ambulatory Visit: Payer: Medicare HMO | Admitting: Physical Therapy

## 2021-08-12 ENCOUNTER — Other Ambulatory Visit: Payer: Self-pay

## 2021-08-12 ENCOUNTER — Ambulatory Visit: Payer: Medicare HMO

## 2021-08-12 DIAGNOSIS — M6281 Muscle weakness (generalized): Secondary | ICD-10-CM

## 2021-08-12 DIAGNOSIS — R2681 Unsteadiness on feet: Secondary | ICD-10-CM | POA: Diagnosis not present

## 2021-08-12 NOTE — Therapy (Signed)
[place] [facility] [address] [place], [place], [address] Phone: [phone]   Fax:  [phone]  Physical Therapy Treatment  Patient Details  Name: Samantha Lang MRN: [id number] Date of Birth: July 13, 1941 Referring Provider (PT): Halina Maidens MD   Encounter Date: 08/12/2021   PT End of Session - 08/12/21 1035     Visit Number 3    Number of Visits 16    Date for PT Re-Evaluation 09/22/21    Authorization - Visit Number 3    Authorization - Number of Visits 10    PT Start Time [id number]    PT Stop Time 1116    PT Time Calculation (min) 45 min    Activity Tolerance Patient tolerated treatment well;No increased pain    Behavior During Therapy WFL for tasks assessed/performed             Past Medical History:  Diagnosis Date   Allergy    Anxiety    Aortic valve stenosis, mild    Depression    mild   Diabetes mellitus age 56   GERD (gastroesophageal reflux disease)    Heart murmur    Hemorrhoids    Hyperlipidemia    Hypertension 2003   Incontinence    Female stress   Mitral incompetence    Personal history of chemotherapy    3 treatments   Personal history of radiation therapy    5 treatments   Postmenopausal atrophic vaginitis    Torn rotator cuff    Uterine cancer [facility])    treated    Past Surgical History:  Procedure Laterality Date   ABDOMINAL HYSTERECTOMY  07/2016   BREAST BIOPSY Right 03/21/08   right, benign    COLONOSCOPY WITH PROPOFOL N/A 09/22/2017   Procedure: COLONOSCOPY WITH PROPOFOL;  Surgeon: Lucilla Lame, MD;  Location: [place];  Service: Gastroenterology;  Laterality: N/A;  diabetic   EYE SURGERY  Oct 2009   for ptosis,  Dr. Rosaria Ferries, eyelid lift   POLYPECTOMY  09/22/2017   Procedure: POLYPECTOMY;  Surgeon: Lucilla Lame, MD;  Location: [place];  Service: Gastroenterology;;   TUBAL LIGATION      There were no vitals filed for this visit.   Subjective Assessment - 08/12/21  1029     Subjective Pt present to tx with mild GI issues and increase in R shoulder pain due to helping with baby shower on sunday. Pt stated that she has h/o rotator cuff issues with occ flare ups. Pt reports no falls or near falls from the last visit. Pt stated that she is fearful of falling unless she is hyper-focused on the task.    Pertinent History R Rotator cuff injury d/t fall in 09/2021;    Limitations House hold activities;Standing;Walking    How long can you sit comfortably? As needed    How long can you stand comfortably? As needed    How long can you walk comfortably? less than 30 minute    Patient Stated Goals To walk without fall    Currently in Pain? No/denies             Ther-ex  Nustep: S_7, L4, x10 minutes to promote tissue extensibility and circulation. Completed without UE assist to facilitate a LE fatigue before balance training to create a more life-like situation that would cause a fall. SpO2: 96% HR: 82      Neuro:  //bar:    Standing Airex with CGA and occ UE touch  assist and foot touch-down during SLS to regain balance. 1 min in each position with verbal cueing for core activation and visual stabilization for greater stability:  1) Standard stance  2)Rhomberg  3)Tandem L-R  4)SLS L-R    Obstacle course for 18 min with varying set ups to facilitate stepping forward/lateral at different heights hurdles (6in-12in). Duel tasking was facilitate by naming boys names starting with the descending letters of the alphabet. Pt displayed increase in stepping and balance errors during duel tasking compared to without. Pt also displayed increased stepping/balance with time 2/2 fatigue.              PT Long Term Goals - 07/28/21 1000       PT LONG TERM GOAL #1   Title Pt will be independent with HEP in order to improve strength and balance in order to decrease fall risk and improve function at home.    Baseline IE: 8/3 no HEP    Time 8    Period Weeks     Status New    Target Date 09/22/21      PT LONG TERM GOAL #2   Title Pt will improve FGA by at least 3 points in order to demonstrate clinically significant improvement in balance and decreased risk for falls.    Baseline IE: FGA: 25    Time 8    Period Weeks    Status New    Target Date 09/22/21      PT LONG TERM GOAL #3   Title Pt will decrease 5xSTS by at least 3 seconds in order to demonstrate clinically significant improvement in LE strength.    Baseline IE: 8/2, 13 seconds    Time 8    Period Weeks    Status New    Target Date 09/22/21      PT LONG TERM GOAL #4   Title Pt will improve B LE MMT by 1/2 grade to facilitate stair clearance and ensure safety.    Baseline IE: Strength  R/L  3+/3+ Hip flexion  4/4 Hip abduction  4/4 Hip adduction  4/4Knee extension  4/4 Knee flexion    Time 8    Period Weeks    Status New    Target Date 09/22/21      PT LONG TERM GOAL #5   Title Pt. will increase FOTO to 56 to improve functional mobility/ safety.    Baseline Initial FOTO: 45    Time 8    Period Weeks    Status New    Target Date 09/22/21                   Plan - 08/12/21 1036     Clinical Impression Statement Today's tx was focus on challenging static and dynamic balance after facilitating mod LE fatigue on the NuStep without UE assist. Pt displayed decreased balancing ability with time due to fatigue. Pt also displayed a mark increase in stepping and balance errors with dueling tasking. Pt continues to display LE balance and stability deficits that are increased with fatigue and duel task challenge which increased fall risk. Pt. will continue to benefit from skilled physical therapy to progress POC to address remaining deficits to facilitate maximum functional capacity for optimal personal health and wellness for ADLs.    Examination-Activity Limitations Stairs    Stability/Clinical Decision Making Stable/Uncomplicated    Clinical Decision Making Low    Rehab  Potential Good    PT Frequency 2x / week  PT Duration 8 weeks    PT Treatment/Interventions Manual techniques;Passive range of motion;Therapeutic activities;Functional mobility training;Therapeutic exercise;Balance training;Neuromuscular re-education;Gait training;Stair training;Patient/family education;ADLs/Self Care Home Management    PT Next Visit Plan Reassess adherence HEP    PT Home Exercise Plan Access Code: [email or web address]    Consulted and Agree with Plan of Care Patient             Patient will benefit from skilled therapeutic intervention in order to improve the following deficits and impairments:  Decreased balance, Decreased activity tolerance, Decreased endurance, Postural dysfunction, Decreased mobility, Decreased strength, Difficulty walking  Visit Diagnosis: Muscle weakness (generalized)  Unsteadiness on feet     Problem List 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   LVH (left ventricular hypertrophy) due to hypertensive disease, without heart failure 04/11/2019   Foot pain, right 10/10/2018   Ganglion cyst of left foot 08/29/2018   Xerosis of skin 08/29/2018   Palpitations 08/08/2018   Colon cancer screening    Trochanteric bursitis of left hip 12/07/2016   History of endometrial cancer 08/03/2016   Edema leg 04/13/2016   Type II diabetes mellitus with complication ([place]) 123XX123   Shoulder pain, left 02/12/2016   Acid reflux 10/02/2015   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   Hyperlipidemia associated with type 2 diabetes mellitus ([place]) 04/08/2015   Aortic heart valve narrowing 03/26/2015   Bilateral carotid artery stenosis 03/26/2015   Anxiety    Environmental and seasonal allergies    Postmenopausal atrophic vaginitis    Depression,  major, recurrent, moderate ([facility])    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 I have personally read, edited and approve of the note as written.   Fara Olden SPT Phillips Grout PT, DPT, GCS  08/12/2021, 2:29 PM  [facility] [address] [place], [place], [address] Phone: [phone]   Fax:  [phone]  Name: Samantha Lang MRN: [id number] Date of Birth: Aug 01, 1941

## 2021-08-17 ENCOUNTER — Encounter: Payer: Medicare HMO | Admitting: Physical Therapy

## 2021-08-19 ENCOUNTER — Other Ambulatory Visit: Payer: Self-pay

## 2021-08-19 ENCOUNTER — Encounter: Payer: Self-pay | Admitting: Physical Therapy

## 2021-08-19 ENCOUNTER — Ambulatory Visit: Payer: Medicare HMO | Admitting: Physical Therapy

## 2021-08-19 DIAGNOSIS — M6281 Muscle weakness (generalized): Secondary | ICD-10-CM

## 2021-08-19 DIAGNOSIS — R2681 Unsteadiness on feet: Secondary | ICD-10-CM | POA: Diagnosis not present

## 2021-08-19 NOTE — Therapy (Signed)
[place] [facility] [address] [place], [place], [address] Phone: [phone]   Fax:  [phone]  Physical Therapy Treatment  Patient Details  Name: Samantha Lang MRN: [id number] Date of Birth: 08/02/1941 Referring Provider (PT): Halina Maidens MD   Encounter Date: 08/19/2021   PT End of Session - 08/19/21 1020     Visit Number 4    Number of Visits 16    Date for PT Re-Evaluation 09/22/21    Authorization - Visit Number 4    Authorization - Number of Visits 10    PT Start Time [id number]    PT Stop Time [id number]    PT Time Calculation (min) 43 min    Activity Tolerance Patient tolerated treatment well;No increased pain    Behavior During Therapy WFL for tasks assessed/performed             Past Medical History:  Diagnosis Date   Allergy    Anxiety    Aortic valve stenosis, mild    Depression    mild   Diabetes mellitus age 9   GERD (gastroesophageal reflux disease)    Heart murmur    Hemorrhoids    Hyperlipidemia    Hypertension 2003   Incontinence    Female stress   Mitral incompetence    Personal history of chemotherapy    3 treatments   Personal history of radiation therapy    5 treatments   Postmenopausal atrophic vaginitis    Torn rotator cuff    Uterine cancer [facility])    treated    Past Surgical History:  Procedure Laterality Date   ABDOMINAL HYSTERECTOMY  07/2016   BREAST BIOPSY Right 03/21/08   right, benign    COLONOSCOPY WITH PROPOFOL N/A 09/22/2017   Procedure: COLONOSCOPY WITH PROPOFOL;  Surgeon: Lucilla Lame, MD;  Location: [place];  Service: Gastroenterology;  Laterality: N/A;  diabetic   EYE SURGERY  Oct 2009   for ptosis,  Dr. Rosaria Ferries, eyelid lift   POLYPECTOMY  09/22/2017   Procedure: POLYPECTOMY;  Surgeon: Lucilla Lame, MD;  Location: [place];  Service: Gastroenterology;;   TUBAL LIGATION      There were no vitals filed for this visit.   Subjective Assessment - 08/19/21  1023     Subjective Pt present to tx without any reports of falls or near falls. Pt continues to report R shoulder pain (4/10 NPS) that was onset from the baby shower. Pt reports that she is feeling more fatigued then normal.    Pertinent History R Rotator cuff injury d/t fall in 09/2021;    Limitations House hold activities;Standing;Walking    How long can you sit comfortably? As needed    How long can you stand comfortably? As needed    How long can you walk comfortably? less than 30 minute    Patient Stated Goals To walk without fall    Currently in Pain? Yes    Pain Score 4     Pain Location Shoulder    Pain Orientation Right    Pain Descriptors / Indicators Aching              Nustep: S_7, L4, x10 Completed without UE assist to facilitate a LE fatigue before balance training to create a more life-like situation that would cause a fall. *not billed*   Neuro:  //bar:     Obstacle course for 18 min with varying set ups to facilitate stepping forward/lateral at different heights  hurdles (6in-12in). Duel tasking was facilitate by naming boys names starting with the descending letters of the alphabet. Pt displayed increase in stepping and balance errors during duel tasking compared to without. Pt also displayed increased stepping/balance with time 2/2 fatigue.     Toe tapping with 2x1 min 6'' step and 2x1 min 12'' step with light touch assist and verbal cueing to ensure controlled motion and not letting to foot fall fast due to fatigue    4# bilat ankle weight LAQ and Hip marching x 30 each movement. With verbal cueing to ensure coordination and quality of movement to facilitate greater step hight/length during gait to prevent to catching during swing phase of gait.         PT Education - 08/19/21 1025     Education Details Pt was educated on correct form and technique with proper gait.    Person(s) Educated Patient    Methods Explanation;Demonstration;Verbal cues     Comprehension Verbalized understanding;Need further instruction                 PT Long Term Goals - 07/28/21 1000       PT LONG TERM GOAL #1   Title Pt will be independent with HEP in order to improve strength and balance in order to decrease fall risk and improve function at home.    Baseline IE: 8/3 no HEP    Time 8    Period Weeks    Status New    Target Date 09/22/21      PT LONG TERM GOAL #2   Title Pt will improve FGA by at least 3 points in order to demonstrate clinically significant improvement in balance and decreased risk for falls.    Baseline IE: FGA: 25    Time 8    Period Weeks    Status New    Target Date 09/22/21      PT LONG TERM GOAL #3   Title Pt will decrease 5xSTS by at least 3 seconds in order to demonstrate clinically significant improvement in LE strength.    Baseline IE: 8/2, 13 seconds    Time 8    Period Weeks    Status New    Target Date 09/22/21      PT LONG TERM GOAL #4   Title Pt will improve B LE MMT by 1/2 grade to facilitate stair clearance and ensure safety.    Baseline IE: Strength  R/L  3+/3+ Hip flexion  4/4 Hip abduction  4/4 Hip adduction  4/4Knee extension  4/4 Knee flexion    Time 8    Period Weeks    Status New    Target Date 09/22/21      PT LONG TERM GOAL #5   Title Pt. will increase FOTO to 56 to improve functional mobility/ safety.    Baseline Initial FOTO: 45    Time 8    Period Weeks    Status New    Target Date 09/22/21                   Plan - 08/19/21 1229     Clinical Impression Statement Pt displayed decrease in balance ability with increase in near falls or minor loss of balance that required min-mod assist with gait belt to correct. Pt stated that she is considering reaching out to a mental health specialist for greater mental health and adherence to greater physical activity. Pt continues to display poor coordination and balance that  is increased during duel tasking. Pt. will continue to  benefit from skilled physical therapy to progress POC to address remaining deficits to facilitate maximum functional capacity for optimal personal health and wellness for ADLs.    Examination-Activity Limitations Stairs    Stability/Clinical Decision Making Stable/Uncomplicated    Clinical Decision Making Low    Rehab Potential Good    PT Frequency 2x / week    PT Duration 8 weeks    PT Treatment/Interventions Manual techniques;Passive range of motion;Therapeutic activities;Functional mobility training;Therapeutic exercise;Balance training;Neuromuscular re-education;Gait training;Stair training;Patient/family education;ADLs/Self Care Home Management    PT Next Visit Plan Reassess adherence HEP    PT Home Exercise Plan Access Code: [email or web address]    Consulted and Agree with Plan of Care Patient             Patient will benefit from skilled therapeutic intervention in order to improve the following deficits and impairments:  Decreased balance, Decreased activity tolerance, Decreased endurance, Postural dysfunction, Decreased mobility, Decreased strength, Difficulty walking  Visit Diagnosis: Muscle weakness (generalized)  Unsteadiness on feet     Problem List 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   LVH (left ventricular hypertrophy) due to hypertensive disease, without heart failure 04/11/2019   Foot pain, right 10/10/2018   Ganglion cyst of left foot 08/29/2018   Xerosis of skin 08/29/2018   Palpitations 08/08/2018   Colon cancer screening    Trochanteric bursitis of left hip 12/07/2016   History of endometrial cancer 08/03/2016   Edema leg 04/13/2016   Type II diabetes mellitus with complication ([place]) 123XX123   Shoulder pain, left 02/12/2016   Acid reflux 10/02/2015   MI (mitral incompetence)  04/09/2015   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   Hyperlipidemia associated with type 2 diabetes mellitus ([place]) 04/08/2015   Aortic heart valve narrowing 03/26/2015   Bilateral carotid artery stenosis 03/26/2015   Anxiety    Environmental and seasonal allergies    Postmenopausal atrophic vaginitis    Depression, major, recurrent, moderate (H[facility])    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 [place], [place], [address] Phone: [phone]   Fax:  [phone]  Name: Samantha Lang MRN: [id number] Date of Birth: 30-Dec-1940

## 2021-08-20 DIAGNOSIS — S62525A Nondisplaced fracture of distal phalanx of left thumb, initial encounter for closed fracture: Secondary | ICD-10-CM | POA: Diagnosis not present

## 2021-08-24 ENCOUNTER — Other Ambulatory Visit: Payer: Self-pay

## 2021-08-24 ENCOUNTER — Encounter: Payer: Self-pay | Admitting: Physical Therapy

## 2021-08-24 ENCOUNTER — Ambulatory Visit: Payer: Medicare HMO | Admitting: Physical Therapy

## 2021-08-24 DIAGNOSIS — M6281 Muscle weakness (generalized): Secondary | ICD-10-CM | POA: Diagnosis not present

## 2021-08-24 DIAGNOSIS — R2681 Unsteadiness on feet: Secondary | ICD-10-CM | POA: Diagnosis not present

## 2021-08-24 NOTE — Therapy (Signed)
[facility] [address] [place], [place], [address] Phone: [phone]   Fax:  [phone]  Physical Therapy Treatment  Patient Details  Name: Samantha Lang MRN: [id number] Date of Birth: December 02, 1941 Referring Provider (PT): Halina Maidens MD   Encounter Date: 08/24/2021   PT End of Session - 08/24/21 1525     Visit Number 5    Number of Visits 16    Date for PT Re-Evaluation 09/22/21    Authorization - Visit Number 5    Authorization - Number of Visits 10    PT Start Time [id number]    PT Stop Time 1602    PT Time Calculation (min) 44 min    Activity Tolerance Patient tolerated treatment well;No increased pain    Behavior During Therapy WFL for tasks assessed/performed             Past Medical History:  Diagnosis Date   Allergy    Anxiety    Aortic valve stenosis, mild    Depression    mild   Diabetes mellitus age 22   GERD (gastroesophageal reflux disease)    Heart murmur    Hemorrhoids    Hyperlipidemia    Hypertension 2003   Incontinence    Female stress   Mitral incompetence    Personal history of chemotherapy    3 treatments   Personal history of radiation therapy    5 treatments   Postmenopausal atrophic vaginitis    Torn rotator cuff    Uterine cancer [facility])    treated    Past Surgical History:  Procedure Laterality Date   ABDOMINAL HYSTERECTOMY  07/2016   BREAST BIOPSY Right 03/21/08   right, benign    COLONOSCOPY WITH PROPOFOL N/A 09/22/2017   Procedure: COLONOSCOPY WITH PROPOFOL;  Surgeon: Lucilla Lame, MD;  Location: [place];  Service: Gastroenterology;  Laterality: N/A;  diabetic   EYE SURGERY  Oct 2009   for ptosis,  Dr. Rosaria Ferries, eyelid lift   POLYPECTOMY  09/22/2017   Procedure: POLYPECTOMY;  Surgeon: Lucilla Lame, MD;  Location: [place];  Service: Gastroenterology;;   TUBAL LIGATION      There were no vitals filed for this visit.   Subjective Assessment - 08/24/21  1521     Subjective Pt present to tx with 5/10 R shoulder pain that is slightly increased due to extra house work this weekend. Pt denies any falls but had a near fall were her daughter had to catch her. The near fall occured going up the stairs.    Pertinent History R Rotator cuff injury d/t fall in 09/2021;    Limitations House hold activities;Standing;Walking    How long can you sit comfortably? As needed    How long can you stand comfortably? As needed    How long can you walk comfortably? less than 30 minute    Patient Stated Goals To walk without fall    Currently in Pain? Yes    Pain Score 5     Pain Location Shoulder    Pain Orientation Right    Pain Descriptors / Indicators Aching              Nustep: S_7, L4, x10 Completed without UE assist to facilitate a LE fatigue before balance training to create a more life-like situation that would cause a fall. *not billed*     Neuro:   //-bar:    Toe tapping with 4 x1 min 12'' step  with light touch assist and verbal cueing to ensure controlled motion and not letting to foot fall fast due to fatigue. Set 3-4 were done with only R UE touch assist.    Airex static balance with 1 min in each position. CGA with gait belt donned. 1) standard stance 2) feet together 3) tandem x2 4) single leg stance x2.       PT Long Term Goals - 07/28/21 1000       PT LONG TERM GOAL #1   Title Pt will be independent with HEP in order to improve strength and balance in order to decrease fall risk and improve function at home.    Baseline IE: 8/3 no HEP    Time 8    Period Weeks    Status New    Target Date 09/22/21      PT LONG TERM GOAL #2   Title Pt will improve FGA by at least 3 points in order to demonstrate clinically significant improvement in balance and decreased risk for falls.    Baseline IE: FGA: 25    Time 8    Period Weeks    Status New    Target Date 09/22/21      PT LONG TERM GOAL #3   Title Pt will decrease 5xSTS by  at least 3 seconds in order to demonstrate clinically significant improvement in LE strength.    Baseline IE: 8/2, 13 seconds    Time 8    Period Weeks    Status New    Target Date 09/22/21      PT LONG TERM GOAL #4   Title Pt will improve B LE MMT by 1/2 grade to facilitate stair clearance and ensure safety.    Baseline IE: Strength  R/L  3+/3+ Hip flexion  4/4 Hip abduction  4/4 Hip adduction  4/4Knee extension  4/4 Knee flexion    Time 8    Period Weeks    Status New    Target Date 09/22/21      PT LONG TERM GOAL #5   Title Pt. will increase FOTO to 56 to improve functional mobility/ safety.    Baseline Initial FOTO: 45    Time 8    Period Weeks    Status New    Target Date 09/22/21                   Plan - 08/24/21 1525     Clinical Impression Statement Today's tx was focus on increased tx intensity via duration of balance training. This was done to facilitate balance endurance with ADLs. Requires prolonged seated rest breaks between bouts of exercises 2/2 LE fatigue. Pt continues to display mark LE endurance, strength, and ROM deficits resulting in decreased safe home/community mobiity, increased pain, and limited access to QOL. Pt. will continue to benefit from skilled physical therapy to progress POC to address remaining deficits to facilitate maximum functional capacity for optimal personal health and wellness for ADLs.    Examination-Activity Limitations Stairs    Stability/Clinical Decision Making Stable/Uncomplicated    Clinical Decision Making Low    Rehab Potential Good    PT Frequency 2x / week    PT Duration 8 weeks    PT Treatment/Interventions Manual techniques;Passive range of motion;Therapeutic activities;Functional mobility training;Therapeutic exercise;Balance training;Neuromuscular re-education;Gait training;Stair training;Patient/family education;ADLs/Self Care Home Management    PT Next Visit Plan Reassess adherence HEP    PT Home Exercise Plan  Access Code: [email or web address]  Consulted and Agree with Plan of Care Patient             Patient will benefit from skilled therapeutic intervention in order to improve the following deficits and impairments:  Decreased balance, Decreased activity tolerance, Decreased endurance, Postural dysfunction, Decreased mobility, Decreased strength, Difficulty walking  Visit Diagnosis: Muscle weakness (generalized)  Unsteadiness on feet     Problem List 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   LVH (left ventricular hypertrophy) due to hypertensive disease, without heart failure 04/11/2019   Foot pain, right 10/10/2018   Ganglion cyst of left foot 08/29/2018   Xerosis of skin 08/29/2018   Palpitations 08/08/2018   Colon cancer screening    Trochanteric bursitis of left hip 12/07/2016   History of endometrial cancer 08/03/2016   Edema leg 04/13/2016   Type II diabetes mellitus with complication ([place]) 123XX123   Shoulder pain, left 02/12/2016   Acid reflux 10/02/2015   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   Hyperlipidemia associated with type 2 diabetes mellitus ([place]) 04/08/2015   Aortic heart valve narrowing 03/26/2015   Bilateral carotid artery stenosis 03/26/2015   Anxiety    Environmental and seasonal allergies    Postmenopausal atrophic vaginitis    Depression, major, recurrent, moderate ([place])    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 [place], [place], [address] Phone: [phone]   Fax:  [phone]  Name: Samantha Lang MRN: [id number] Date of Birth:  1941/02/03

## 2021-08-26 ENCOUNTER — Encounter: Payer: Self-pay | Admitting: Physical Therapy

## 2021-08-26 ENCOUNTER — Ambulatory Visit: Payer: Medicare HMO | Attending: Internal Medicine | Admitting: Physical Therapy

## 2021-08-26 ENCOUNTER — Other Ambulatory Visit: Payer: Self-pay

## 2021-08-26 DIAGNOSIS — M6281 Muscle weakness (generalized): Secondary | ICD-10-CM | POA: Insufficient documentation

## 2021-08-26 DIAGNOSIS — R2681 Unsteadiness on feet: Secondary | ICD-10-CM | POA: Diagnosis not present

## 2021-08-26 NOTE — Therapy (Signed)
[facility] [address] [place], [place], [address] Phone: [phone]   Fax:  [phone]  Physical Therapy Treatment  Patient Details  Name: Samantha Lang MRN: [id number] Date of Birth: [birth year] Referring Provider (PT): Halina Maidens MD   Encounter Date: 08/26/2021   PT End of Session - 08/26/21 1757     Visit Number 6    Number of Visits 16    Date for PT Re-Evaluation 09/22/21    Authorization - Visit Number 6    Authorization - Number of Visits 10    PT Start Time [id number]    PT Stop Time [id number]    PT Time Calculation (min) 36 min    Activity Tolerance Patient tolerated treatment well;Patient limited by fatigue;No increased pain    Behavior During Therapy WFL for tasks assessed/performed             Past Medical History:  Diagnosis Date   Allergy    Anxiety    Aortic valve stenosis, mild    Depression    mild   Diabetes mellitus age 41   GERD (gastroesophageal reflux disease)    Heart murmur    Hemorrhoids    Hyperlipidemia    Hypertension 2003   Incontinence    Female stress   Mitral incompetence    Personal history of chemotherapy    3 treatments   Personal history of radiation therapy    5 treatments   Postmenopausal atrophic vaginitis    Torn rotator cuff    Uterine cancer [facility])    treated    Past Surgical History:  Procedure Laterality Date   ABDOMINAL HYSTERECTOMY  07/2016   BREAST BIOPSY Right 03/21/08   right, benign    COLONOSCOPY WITH PROPOFOL N/A 09/22/2017   Procedure: COLONOSCOPY WITH PROPOFOL;  Surgeon: Lucilla Lame, MD;  Location: [place];  Service: Gastroenterology;  Laterality: N/A;  diabetic   EYE SURGERY  Oct 2009   for ptosis,  Dr. Rosaria Ferries, eyelid lift   POLYPECTOMY  09/22/2017   Procedure: POLYPECTOMY;  Surgeon: Lucilla Lame, MD;  Location: [place];  Service: Gastroenterology;;   TUBAL LIGATION      There were no vitals filed for this visit.    Subjective Assessment - 08/26/21 1755     Subjective Pt present to tx with increased fatigue and tiredness 2/2 engagement in home-based-projects. Pt stated no falls from last tx and continued shoulder pain 5/10.    Pertinent History R Rotator cuff injury d/t fall in 09/2021;    Limitations House hold activities;Standing;Walking    How long can you sit comfortably? As needed    How long can you stand comfortably? As needed    How long can you walk comfortably? less than 30 minute    Patient Stated Goals To walk without fall    Currently in Pain? Yes    Pain Score 5     Pain Location Shoulder    Pain Orientation Right    Pain Descriptors / Indicators Aching              Nustep: S_7, L5, x10 Completed without UE assist to facilitate a LE fatigue before balance training to create a more life-like situation that would cause a fall. *not billed*       Neuro:    //-bar:    Lateral stepping on long airex pad with gait belt and CGA x4 laps with verbal cueing to complete movement in the  frontal plane and avoid transverse rotation of the pelvic    Airex reciprocal stepping x3 laps x 5 laps. Gait belt and CGA with verbal cuing to reduce UE assist from bilat to unilat to no UE assist.    Inverted Bosu ball ankle strategies sagittal and frontal plane movement 2x30 in each direction. Gait belt with CGA and occ min A to prevent a fall. Pt required Mod assist stepping on and off bosu ball.        PT Education - 08/26/21 1756     Education Details Pt was educated on the importance of ankle strategies for dynamic balance and decreased fall risk.    Person(s) Educated Patient    Methods Explanation;Demonstration;Tactile cues    Comprehension Verbalized understanding;Need further instruction                 PT Long Term Goals - 07/28/21 1000       PT LONG TERM GOAL #1   Title Pt will be independent with HEP in order to improve strength and balance in order to decrease fall  risk and improve function at home.    Baseline IE: 8/3 no HEP    Time 8    Period Weeks    Status New    Target Date 09/22/21      PT LONG TERM GOAL #2   Title Pt will improve FGA by at least 3 points in order to demonstrate clinically significant improvement in balance and decreased risk for falls.    Baseline IE: FGA: 25    Time 8    Period Weeks    Status New    Target Date 09/22/21      PT LONG TERM GOAL #3   Title Pt will decrease 5xSTS by at least 3 seconds in order to demonstrate clinically significant improvement in LE strength.    Baseline IE: 8/2, 13 seconds    Time 8    Period Weeks    Status New    Target Date 09/22/21      PT LONG TERM GOAL #4   Title Pt will improve B LE MMT by 1/2 grade to facilitate stair clearance and ensure safety.    Baseline IE: Strength  R/L  3+/3+ Hip flexion  4/4 Hip abduction  4/4 Hip adduction  4/4Knee extension  4/4 Knee flexion    Time 8    Period Weeks    Status New    Target Date 09/22/21      PT LONG TERM GOAL #5   Title Pt. will increase FOTO to 56 to improve functional mobility/ safety.    Baseline Initial FOTO: 45    Time 8    Period Weeks    Status New    Target Date 09/22/21                   Plan - 08/26/21 1758     Clinical Impression Statement Today's treatment with limited 2/2 to pt's fatigued state. Pt also presented emotional down with reduced motivation compared to previous tx. Pt continues to required gait belt and CGA during higher level balance tasks. Pt balances decreased with duel tasking. Pt did displayed increased dynamic balance ability compared to last time. Pt. will continue to benefit from skilled physical therapy to progress POC to address remaining deficits to facilitate maximum functional capacity for optimal personal health and wellness for ADLs.    Examination-Activity Limitations Stairs    Stability/Clinical Decision Making Stable/Uncomplicated  Clinical Decision Making Low    Rehab  Potential Good    PT Frequency 2x / week    PT Duration 8 weeks    PT Treatment/Interventions Manual techniques;Passive range of motion;Therapeutic activities;Functional mobility training;Therapeutic exercise;Balance training;Neuromuscular re-education;Gait training;Stair training;Patient/family education;ADLs/Self Care Home Management    PT Next Visit Plan Reassess adherence HEP    PT Home Exercise Plan Access Code: [email or web address]    Consulted and Agree with Plan of Care Patient             Patient will benefit from skilled therapeutic intervention in order to improve the following deficits and impairments:  Decreased balance, Decreased activity tolerance, Decreased endurance, Postural dysfunction, Decreased mobility, Decreased strength, Difficulty walking  Visit Diagnosis: Unsteadiness on feet  Muscle weakness (generalized)     Problem List 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   LVH (left ventricular hypertrophy) due to hypertensive disease, without heart failure 04/11/2019   Foot pain, right 10/10/2018   Ganglion cyst of left foot 08/29/2018   Xerosis of skin 08/29/2018   Palpitations 08/08/2018   Colon cancer screening    Trochanteric bursitis of left hip 12/07/2016   History of endometrial cancer 08/03/2016   Edema leg 04/13/2016   Type II diabetes mellitus with complication ([place]) [id number]   Shoulder pain, left 02/12/2016   Acid reflux 10/02/2015   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   Hyperlipidemia associated with type 2 diabetes mellitus ([place]) 04/08/2015   Aortic heart valve narrowing 03/26/2015   Bilateral carotid artery stenosis 03/26/2015   Anxiety    Environmental and seasonal allergies    Postmenopausal atrophic vaginitis    Depression,  major, recurrent, moderate ([facility])    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 [place], [place], [address] Phone: [phone]   Fax:  [phone]  Name: Samantha Lang MRN: [id number] Date of Birth: Nov 20, 1941

## 2021-08-30 ENCOUNTER — Other Ambulatory Visit: Payer: Self-pay | Admitting: Internal Medicine

## 2021-08-30 DIAGNOSIS — M10079 Idiopathic gout, unspecified ankle and foot: Secondary | ICD-10-CM

## 2021-08-31 ENCOUNTER — Encounter: Payer: Self-pay | Admitting: Physical Therapy

## 2021-08-31 ENCOUNTER — Encounter: Payer: Self-pay | Admitting: Internal Medicine

## 2021-08-31 ENCOUNTER — Ambulatory Visit: Payer: Medicare HMO | Admitting: Physical Therapy

## 2021-08-31 ENCOUNTER — Other Ambulatory Visit: Payer: Self-pay

## 2021-08-31 ENCOUNTER — Telehealth: Payer: Self-pay | Admitting: Internal Medicine

## 2021-08-31 DIAGNOSIS — R2681 Unsteadiness on feet: Secondary | ICD-10-CM

## 2021-08-31 DIAGNOSIS — G8929 Other chronic pain: Secondary | ICD-10-CM

## 2021-08-31 DIAGNOSIS — M6281 Muscle weakness (generalized): Secondary | ICD-10-CM | POA: Diagnosis not present

## 2021-08-31 NOTE — Telephone Encounter (Signed)
Patient called about shoulder pain. She says she fell last August and hurt her right shoulder. She says she was seeing someone and was on Meloxicam, which really helped, but had to come off due to BP. She says she stopped seeing that provider. She says she's been using some arthritis cream for the pain and it helps for the most part until over in the night when the pain wakes her up. She says someone told her about Dr. Zigmund Daniel. I advised Dr. Army Melia responded to her MyChart message and says to call the office to schedule a new ortho appointment and has she had surgery? She says no surgery. I attempted to schedule the new ortho, but it was taking me out to January. I advised the patient I will send this to the office and someone will call with an appointment.

## 2021-08-31 NOTE — Therapy (Signed)
[place] [facility] [address] [place], [place], [address] Phone: [phone]   Fax:  [phone]  Physical Therapy Treatment  Patient Details  Name: Samantha Lang MRN: [id number] Date of Birth: [birth year] Referring Provider (PT): Halina Maidens MD   Encounter Date: 08/31/2021   PT End of Session - 08/31/21 1527     Visit Number 7    Number of Visits 16    Date for PT Re-Evaluation 09/22/21    Authorization - Visit Number 7    Authorization - Number of Visits 10    PT Start Time [id number]    PT Stop Time 1601    PT Time Calculation (min) 46 min    Activity Tolerance Patient tolerated treatment well;Patient limited by fatigue;No increased pain    Behavior During Therapy WFL for tasks assessed/performed             Past Medical History:  Diagnosis Date   Allergy    Anxiety    Aortic valve stenosis, mild    Depression    mild   Diabetes mellitus age 70   GERD (gastroesophageal reflux disease)    Heart murmur    Hemorrhoids    Hyperlipidemia    Hypertension 2003   Incontinence    Female stress   Mitral incompetence    Personal history of chemotherapy    3 treatments   Personal history of radiation therapy    5 treatments   Postmenopausal atrophic vaginitis    Torn rotator cuff    Uterine cancer [facility]    treated    Past Surgical History:  Procedure Laterality Date   ABDOMINAL HYSTERECTOMY  07/2016   BREAST BIOPSY Right 03/21/08   right, benign    COLONOSCOPY WITH PROPOFOL N/A 09/22/2017   Procedure: COLONOSCOPY WITH PROPOFOL;  Surgeon: Lucilla Lame, MD;  Location: [place];  Service: Gastroenterology;  Laterality: N/A;  diabetic   EYE SURGERY  Oct 2009   for ptosis,  Dr. Rosaria Ferries, eyelid lift   POLYPECTOMY  09/22/2017   Procedure: POLYPECTOMY;  Surgeon: Lucilla Lame, MD;  Location: [place];  Service: Gastroenterology;;   TUBAL LIGATION      There were no vitals filed for this visit.    Subjective Assessment - 08/31/21 1520     Subjective Pt presents to tx with large complaints of R shoulder pain and soreness. Pt states that her pain has gotten worse of the last 4 months. Pt states R shoulder pain at worse is 7-8/10. Pt states that she picked up her trash this morning that caused a sharp shooting pain.  Pt. is very frustrated with her shoulder and would like a suggestion for referral.    Pertinent History R Rotator cuff injury d/t fall in 09/2021;    Limitations House hold activities;Standing;Walking    How long can you sit comfortably? As needed    How long can you stand comfortably? As needed    How long can you walk comfortably? less than 30 minute    Patient Stated Goals To walk without fall    Currently in Pain? Yes    Pain Score 6     Pain Location Shoulder    Pain Orientation Right    Pain Descriptors / Indicators Aching               Nustep: S_7, L5, x10 Completed without UE assist to facilitate a LE fatigue before balance training to create a more life-like situation  that would cause a fall. *not billed*     Neuro:    //-bar:    Walking marches, backward walking, and lateral walking 3 laps. Gait belt donned CGA assist with continued questioning to facilitate duel tasking.   Nautilus resisted walking forwards and backwards. CGA with gait belt. 30# each direction. Extensive verbal cueing to facilitate proper step length and high quality mobility.   Inverted Bosu ball ankle strategies sagittal and frontal plane movement 2x30 in each direction. Gait belt with CGA and occ min A to prevent a fall. Pt required Mod assist stepping on and off bosu ball.        PT Education - 08/31/21 1526     Education Details Pt was educated returning to her PCP for a futher assessment of her R shoulder and possible PT referral.    Person(s) Educated Patient    Methods Explanation;Demonstration    Comprehension Verbalized understanding;Need further instruction                  PT Long Term Goals - 07/28/21 1000       PT LONG TERM GOAL #1   Title Pt will be independent with HEP in order to improve strength and balance in order to decrease fall risk and improve function at home.    Baseline IE: 8/3 no HEP    Time 8    Period Weeks    Status New    Target Date 09/22/21      PT LONG TERM GOAL #2   Title Pt will improve FGA by at least 3 points in order to demonstrate clinically significant improvement in balance and decreased risk for falls.    Baseline IE: FGA: 25    Time 8    Period Weeks    Status New    Target Date 09/22/21      PT LONG TERM GOAL #3   Title Pt will decrease 5xSTS by at least 3 seconds in order to demonstrate clinically significant improvement in LE strength.    Baseline IE: 8/2, 13 seconds    Time 8    Period Weeks    Status New    Target Date 09/22/21      PT LONG TERM GOAL #4   Title Pt will improve B LE MMT by 1/2 grade to facilitate stair clearance and ensure safety.    Baseline IE: Strength  R/L  3+/3+ Hip flexion  4/4 Hip abduction  4/4 Hip adduction  4/4Knee extension  4/4 Knee flexion    Time 8    Period Weeks    Status New    Target Date 09/22/21      PT LONG TERM GOAL #5   Title Pt. will increase FOTO to 56 to improve functional mobility/ safety.    Baseline Initial FOTO: 45    Time 8    Period Weeks    Status New    Target Date 09/22/21                   Plan - 08/31/21 1605     Clinical Impression Statement Pt tolerated tx well with addition of nautilus resisted walking. Tx was completed without UE assist due to increase in R shoulder pain.  Pt was educated on returning to PCP for further shoulder assessment and possible referral to a specialist.  Pt continues to display mark LE endurance, strength, and ROM deficits resulting in decreased safe home/community mobiity, increased pain, and limited access to  QOL. Pt. will continue to benefit from skilled physical therapy to progress POC to  address remaining deficits to facilitate maximum functional capacity for optimal personal health and wellness for ADLs.    Examination-Activity Limitations Stairs    Stability/Clinical Decision Making Stable/Uncomplicated    Clinical Decision Making Low    Rehab Potential Good    PT Frequency 2x / week    PT Duration 8 weeks    PT Treatment/Interventions Manual techniques;Passive range of motion;Therapeutic activities;Functional mobility training;Therapeutic exercise;Balance training;Neuromuscular re-education;Gait training;Stair training;Patient/family education;ADLs/Self Care Home Management    PT Next Visit Plan Possible assessement of R shoulder.    PT Home Exercise Plan Access Code: [email or web address]    Consulted and Agree with Plan of Care Patient             Patient will benefit from skilled therapeutic intervention in order to improve the following deficits and impairments:  Decreased balance, Decreased activity tolerance, Decreased endurance, Postural dysfunction, Decreased mobility, Decreased strength, Difficulty walking  Visit Diagnosis: Unsteadiness on feet  Muscle weakness (generalized)     Problem List 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   LVH (left ventricular hypertrophy) due to hypertensive disease, without heart failure 04/11/2019   Foot pain, right 10/10/2018   Ganglion cyst of left foot 08/29/2018   Xerosis of skin 08/29/2018   Palpitations 08/08/2018   Colon cancer screening    Trochanteric bursitis of left hip 12/07/2016   History of endometrial cancer 08/03/2016   Edema leg 04/13/2016   Type II diabetes mellitus with complication ([place]) 123XX123   Shoulder pain, left 02/12/2016   Acid reflux 10/02/2015   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   TI (tricuspid incompetence)  04/09/2015   Hyperlipidemia associated with type 2 diabetes mellitus ([place]) 04/08/2015   Aortic heart valve narrowing 03/26/2015   Bilateral carotid artery stenosis 03/26/2015   Anxiety    Environmental and seasonal allergies    Postmenopausal atrophic vaginitis    Depression, major, recurrent, moderate (HCC)    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 [place], [place], [address] Phone: [phone]   Fax:  [phone]  Name: Samantha Lang MRN: [id number] Date of Birth: 17-Feb-1941

## 2021-08-31 NOTE — Telephone Encounter (Signed)
Requested medication (s) are due for refill today: yes  Requested medication (s) are on the active medication list: yes  Last refill:  03/19/21  Future visit scheduled: yes  Notes to clinic:  overdue lab work (uric acid)   Requested Prescriptions  Pending Prescriptions Disp Refills   allopurinol (ZYLOPRIM) 100 MG tablet [Pharmacy Med Name: ALLOPURINOL 100 MG TABLET] 90 tablet 1    Sig: TAKE [place]     Endocrinology:  Gout Agents Failed - 08/30/2021 10:08 AM      Failed - Uric Acid in normal range and within 360 days    Uric Acid  Date Value Ref Range Status  10/12/2018 7.0 2.5 - 7.1 mg/dL Final    Comment:               Therapeutic target for gout patients: <6.0          Passed - Cr in normal range and within 360 days    Creatinine, Ser  Date Value Ref Range Status  11/23/2020 1.00 0.57 - 1.00 mg/dL Final          Passed - Valid encounter within last 12 months    Recent Outpatient Visits           1 month ago Frequent falls   [place], [place]   2 months ago Change in bowel habits   [facility] Glean Hess, MD   5 months ago Essential hypertension   [facility] Glean Hess, MD   9 months ago Type II diabetes mellitus with complication [facility])   [facility] Glean Hess, MD   1 year ago Annual physical exam   [facility] Glean Hess, MD       Future Appointments             In 2 weeks Army Melia Jesse Sans, MD [facility], [place]   In 1 month Army Melia, Jesse Sans, MD [facility]

## 2021-08-31 NOTE — Telephone Encounter (Signed)
Patient calling back about message she left on my chart, increase pain and limited range of motion in my right shoulder, rotator cuff injury from last August. Dr Nehemiah Massed took me off melixam because of BP. I need to be seen but did not really like the Ortho at [facility] . Someone said Dr Zigmund Daniel is good and they recommended him .  Appreciate your help. Please call back

## 2021-09-01 ENCOUNTER — Ambulatory Visit
Admission: RE | Admit: 2021-09-01 | Discharge: 2021-09-01 | Disposition: A | Discharge status: Home / Self Care | Payer: Medicare HMO | Attending: Family Medicine | Admitting: Family Medicine

## 2021-09-01 ENCOUNTER — Ambulatory Visit
Admission: RE | Admit: 2021-09-01 | Discharge: 2021-09-01 | Disposition: A | Discharge status: Home / Self Care | Payer: Medicare HMO | Source: Ambulatory Visit | Attending: Family Medicine | Admitting: Family Medicine

## 2021-09-01 DIAGNOSIS — M25511 Pain in right shoulder: Secondary | ICD-10-CM | POA: Diagnosis not present

## 2021-09-01 DIAGNOSIS — G8929 Other chronic pain: Secondary | ICD-10-CM | POA: Diagnosis not present

## 2021-09-01 NOTE — Telephone Encounter (Signed)
Called patient to verify if she had any imaging done of her right shoulder.  No answer, left voicemail notifying to come today or tomorrow before appointment for an X-Ray.  Ordered.   Patient has history of right rotator cuff tear arthopathy and right biceps tendinitis according to her chart.

## 2021-09-01 NOTE — Telephone Encounter (Signed)
Patient has appointment tomorrow, 09/02/21.  Please advise for any imaging needed beforehand.

## 2021-09-02 ENCOUNTER — Ambulatory Visit (INDEPENDENT_AMBULATORY_CARE_PROVIDER_SITE_OTHER): Payer: Medicare HMO | Admitting: Family Medicine

## 2021-09-02 ENCOUNTER — Encounter: Payer: Medicare HMO | Admitting: Physical Therapy

## 2021-09-02 ENCOUNTER — Other Ambulatory Visit: Payer: Self-pay

## 2021-09-02 ENCOUNTER — Encounter: Payer: Self-pay | Admitting: Family Medicine

## 2021-09-02 VITALS — BP 124/84 | HR 66 | Temp 98.1°F | Ht 62.0 in | Wt 168.0 lb

## 2021-09-02 DIAGNOSIS — M7521 Bicipital tendinitis, right shoulder: Secondary | ICD-10-CM | POA: Diagnosis not present

## 2021-09-02 DIAGNOSIS — M12811 Other specific arthropathies, not elsewhere classified, right shoulder: Secondary | ICD-10-CM | POA: Diagnosis not present

## 2021-09-02 DIAGNOSIS — M75101 Unspecified rotator cuff tear or rupture of right shoulder, not specified as traumatic: Secondary | ICD-10-CM | POA: Diagnosis not present

## 2021-09-02 MED ORDER — DICLOFENAC SODIUM 50 MG PO TBEC: 50.0000 mg | DELAYED_RELEASE_TABLET | Freq: Two times a day (BID) | ORAL | 0 refills | Status: DC

## 2021-09-02 NOTE — Assessment & Plan Note (Signed)
See additional assessment(s) for plan details. 

## 2021-09-02 NOTE — Progress Notes (Signed)
Primary Care / Sports Medicine Office Visit  Patient Information:  Patient ID: Samantha Lang, female DOB: 1941-08-26 Age: 80 y.o. MRN: [id number]   Samantha Lang is a pleasant 80 y.o. female presenting with the following:  Chief Complaint  Patient presents with   New Patient (Initial Visit)   Shoulder Pain    Right; X-ray 09/01/21; increase in pain and limited ROM since this past weekend; history of right rotator cuff tear 11/23/2020 and right biceps tendinitis; follows with PT for balance and muscle weakness; frequent falls; has used Voltaren topical gel and ibuprofen with some relief; right-handedness; 5/10 pain    Review of Systems pertinent details above   Patient Active Problem List   Diagnosis Date Noted   Biceps tendinitis of right upper extremity 09/02/2021   Acquired trigger finger 01/13/2021   Radial styloid tenosynovitis 01/13/2021   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   LVH (left ventricular hypertrophy) due to hypertensive disease, without heart failure 04/11/2019   Foot pain, right 10/10/2018   Ganglion cyst of left foot 08/29/2018   Xerosis of skin 08/29/2018   Palpitations 08/08/2018   Colon cancer screening    Trochanteric bursitis of left hip 12/07/2016   History of endometrial cancer 08/03/2016   Edema leg 04/13/2016   Type II diabetes mellitus with complication ([place]) 123XX123   Shoulder pain, left 02/12/2016   Acid reflux 10/02/2015   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   Hyperlipidemia associated with type 2 diabetes mellitus ([place]) 04/08/2015   Aortic heart valve narrowing 03/26/2015   Bilateral carotid artery stenosis 03/26/2015   Anxiety    Environmental and seasonal allergies    Postmenopausal atrophic vaginitis    Depression, major, recurrent, moderate (HCC)    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 ([place])    Essential  hypertension    Past Medical History:  Diagnosis Date   Allergy    Anxiety    Aortic valve stenosis, mild    Depression    mild   Diabetes mellitus age 75   GERD (gastroesophageal reflux disease)    Heart murmur    Hemorrhoids    Hyperlipidemia    Hypertension 2003   Incontinence    Female stress   Mitral incompetence    Personal history of chemotherapy    3 treatments   Personal history of radiation therapy    5 treatments   Postmenopausal atrophic vaginitis    Torn rotator cuff    Uterine cancer ([place])    treated   Outpatient Encounter Medications as of 09/02/2021  Medication Sig   allopurinol (ZYLOPRIM) 100 MG tablet Take 1 tablet (100 mg total) by mouth daily.   amLODipine (NORVASC) 5 MG tablet TAKE 1 TABLET BY MOUTH TWICE A DAY   aspirin 81 MG tablet Take 81 mg by mouth daily.   atorvastatin (LIPITOR) 10 MG tablet Take 1 tablet by mouth daily.   carvedilol (COREG) 6.25 MG tablet Take 6.25 mg by mouth 2 (two) times daily.   colchicine 0.6 MG tablet Take 0.6 mg by mouth daily. PRN only, takes rarely   diclofenac (VOLTAREN) 50 MG EC tablet Take 1 tablet (50 mg total) by mouth 2 (two) times daily.   JANUVIA 100 MG tablet TAKE 1 TABLET BY MOUTH EVERY DAY   Multiple Vitamin (MULTIVITAMIN) capsule Take 1 capsule by mouth daily.   pantoprazole (PROTONIX) 40 MG tablet TAKE  1 TABLET BY MOUTH EVERY DAY   psyllium (METAMUCIL SMOOTH TEXTURE) 58.6 % powder Please use one does every other day.   telmisartan-hydrochlorothiazide (MICARDIS HCT) 80-25 MG tablet Take 1 tablet by mouth daily.   venlafaxine XR (EFFEXOR-XR) 150 MG 24 hr capsule TAKE (1) CAPSULE BY MOUTH EVERY DAY   VITAMIN D PO Take 5,000 Units by mouth daily.   No facility-administered encounter medications on file as of 09/02/2021.   Past Surgical History:  Procedure Laterality Date   ABDOMINAL HYSTERECTOMY  07/2016   BREAST BIOPSY Right 03/21/08   right, benign    COLONOSCOPY WITH PROPOFOL N/A 09/22/2017   Procedure:  COLONOSCOPY WITH PROPOFOL;  Surgeon: Lucilla Lame, MD;  Location: [place];  Service: Gastroenterology;  Laterality: N/A;  diabetic   EYE SURGERY  Oct 2009   for ptosis,  Dr. Rosaria Ferries, eyelid lift   POLYPECTOMY  09/22/2017   Procedure: POLYPECTOMY;  Surgeon: Lucilla Lame, MD;  Location: [place];  Service: Gastroenterology;;   TUBAL LIGATION      Vitals:   09/02/21 1345  BP: 124/84  Pulse: 66  Temp: 98.1 F (36.7 C)  SpO2: 98%   Vitals:   09/02/21 1345  Weight: 168 lb (76.2 kg)  Height: '5\' 2"'$  (1.575 m)   Body mass index is 30.73 kg/m.  No results found.   Independent interpretation of notes and tests performed by another provider:   Independent interpretation of right shoulder x-ray dated 09/02/2021 reveals cortical roughening at the lateral humeral head, subacromial border, and at the acromioclavicular joint where there is mild narrowing and degenerative cortical changes.  Glenohumeral articulation appears maintained, no acute osseous process identified.  Procedures performed:   None  Pertinent History, Exam, Impression, and Recommendations:   Right rotator cuff tear arthropathy Right-hand-dominant patient presenting with acute on chronic right shoulder pain in the setting of recent relative overuse (baby shower, chores around) over the past few weeks.  Pain localized primarily anterior with radiation to the biceps, some radiation laterally, denies any trauma, no ecchymosis, pain with pushing/pulling and overhead activities.  She has trialed topical diclofenac and ibuprofen sparingly with some improvement in symptomatology.  Physical exam reveals limited range of motion with forward flexion due to pain, abduction 10 degree deficit when compared to contralateral, isolated external rotation and supraspinatus testing reveals 4/5 strength, limited by pain, contralateral benign, internal rotation benign bilaterally.  Positive speeds, Yergason's, Neer's, Hawkins,  and she is tender at the subacromial and bicipital groove.  Radiographs reveal degenerative changes consistent with rotator cuff dysfunction, I reviewed the same with the patient as well as treatment strategies given her overall clinical history.  We will initiate scheduled diclofenac x2 weeks, relative rest, supportive care, and close follow-up in 2 weeks time.  If suboptimal progress noted, can consider ultrasound-guided injections to the subacromial space and/or bicipital groove.  Once symptoms appropriately controlled, she would benefit from formal physical therapy.  Biceps tendinitis of right upper extremity See additional assessment(s) for plan details.    Orders & Medications Meds ordered this encounter  Medications   diclofenac (VOLTAREN) 50 MG EC tablet    Sig: Take 1 tablet (50 mg total) by mouth 2 (two) times daily.    Dispense:  30 tablet    Refill:  0   No orders of the defined types were placed in this encounter.    Return in about 2 weeks (around 09/16/2021) for f/u right shoulder.     Montel Culver, MD  [place]

## 2021-09-02 NOTE — Patient Instructions (Signed)
-   Dose diclofenac twice daily with food (stop topical anti-inflammatory and ibuprofen) - Continue with activity as tolerated using shoulder symptoms as a guide - Can utilize heat alternating with ice at 20-minute intervals for additional symptom control - Return for follow-up in 2 weeks, contact our office for any questions between now and then

## 2021-09-02 NOTE — Assessment & Plan Note (Signed)
Right-hand-dominant patient presenting with acute on chronic right shoulder pain in the setting of recent relative overuse (baby shower, chores around) over the past few weeks.  Pain localized primarily anterior with radiation to the biceps, some radiation laterally, denies any trauma, no ecchymosis, pain with pushing/pulling and overhead activities.  She has trialed topical diclofenac and ibuprofen sparingly with some improvement in symptomatology.  Physical exam reveals limited range of motion with forward flexion due to pain, abduction 10 degree deficit when compared to contralateral, isolated external rotation and supraspinatus testing reveals 4/5 strength, limited by pain, contralateral benign, internal rotation benign bilaterally.  Positive speeds, Yergason's, Neer's, Hawkins, and she is tender at the subacromial and bicipital groove.  Radiographs reveal degenerative changes consistent with rotator cuff dysfunction, I reviewed the same with the patient as well as treatment strategies given her overall clinical history.  We will initiate scheduled diclofenac x2 weeks, relative rest, supportive care, and close follow-up in 2 weeks time.  If suboptimal progress noted, can consider ultrasound-guided injections to the subacromial space and/or bicipital groove.  Once symptoms appropriately controlled, she would benefit from formal physical therapy.

## 2021-09-07 ENCOUNTER — Other Ambulatory Visit: Payer: Self-pay

## 2021-09-07 ENCOUNTER — Ambulatory Visit: Payer: Medicare HMO | Admitting: Physical Therapy

## 2021-09-07 ENCOUNTER — Encounter: Payer: Self-pay | Admitting: Physical Therapy

## 2021-09-07 DIAGNOSIS — M6281 Muscle weakness (generalized): Secondary | ICD-10-CM | POA: Diagnosis not present

## 2021-09-07 DIAGNOSIS — R2681 Unsteadiness on feet: Secondary | ICD-10-CM

## 2021-09-07 NOTE — Therapy (Signed)
[facility] [address] [place], [place], [address] Phone: [phone]   Fax:  [phone]  Physical Therapy Treatment  Patient Details  Name: Samantha Lang MRN: [id number] Date of Birth: [birth year] Referring Provider (PT): Halina Maidens MD   Encounter Date: 09/07/2021   PT End of Session - 09/07/21 1539     Visit Number 8    Number of Visits 16    Date for PT Re-Evaluation 09/22/21    Authorization - Visit Number 8    Authorization - Number of Visits 10    PT Start Time [id number]    PT Stop Time 1609    PT Time Calculation (min) 42 min    Activity Tolerance Patient tolerated treatment well;Patient limited by fatigue;No increased pain    Behavior During Therapy WFL for tasks assessed/performed             Past Medical History:  Diagnosis Date   Allergy    Anxiety    Aortic valve stenosis, mild    Depression    mild   Diabetes mellitus age 32   GERD (gastroesophageal reflux disease)    Heart murmur    Hemorrhoids    Hyperlipidemia    Hypertension 2003   Incontinence    Female stress   Mitral incompetence    Personal history of chemotherapy    3 treatments   Personal history of radiation therapy    5 treatments   Postmenopausal atrophic vaginitis    Torn rotator cuff    Uterine cancer [facility])    treated    Past Surgical History:  Procedure Laterality Date   ABDOMINAL HYSTERECTOMY  07/2016   BREAST BIOPSY Right 03/21/08   right, benign    COLONOSCOPY WITH PROPOFOL N/A 09/22/2017   Procedure: COLONOSCOPY WITH PROPOFOL;  Surgeon: Lucilla Lame, MD;  Location: [place];  Service: Gastroenterology;  Laterality: N/A;  diabetic   EYE SURGERY  Oct 2009   for ptosis,  Dr. Rosaria Ferries, eyelid lift   POLYPECTOMY  09/22/2017   Procedure: POLYPECTOMY;  Surgeon: Lucilla Lame, MD;  Location: [place];  Service: Gastroenterology;;   TUBAL LIGATION      There were no vitals filed for this visit.    Subjective Assessment - 09/07/21 1534     Subjective Pt presents to tx with decrease in R shoulder pain. Pt states no LE pain. Pt states no falls or near falls from last tx. pt is agreeable to reduce POC to 1x week until 9/27. Pt states f/u with Dr. West Pugh 9/22 for reassesment of shoulder.    Pertinent History R Rotator cuff injury d/t fall in 09/2021;    Limitations House hold activities;Standing;Walking    How long can you sit comfortably? As needed    How long can you stand comfortably? As needed    How long can you walk comfortably? less than 30 minute    Patient Stated Goals To walk without fall    Currently in Pain? Yes    Pain Score 3     Pain Location Shoulder    Pain Orientation Right    Pain Descriptors / Indicators Sharp;Sore    Pain Type Acute pain    Pain Onset In the past 7 days             Therapeutic Exercise:  Nustep: S_7, L5, x10 Completed without UE assist to facilitate a LE fatigue before balance training to create a more life-like situation that  would cause a fall.   //-bar:    Walking marches, hamstring curls, and lateral walking 3 laps. Gait belt donned CGA assist with continued questioning to facilitate duel tasking.    Lunge step ups 3x10 each LE onto BOSU ball. First set with bilat UE, 2nd set RUE only, 3rd set no UE assist and CGA with occ mod A to prevent falls.    Nautilus resisted walking forwards, backwards, and lateral walking. CGA with gait belt. 40# each direction. Extensive verbal cueing to facilitate proper step length and high quality mobility.       PT Education - 09/07/21 1537     Education Details Pt was edcuated on updated POC to 1x week    Person(s) Educated Patient    Methods Explanation;Demonstration    Comprehension Verbalized understanding                 PT Long Term Goals - 07/28/21 1000       PT LONG TERM GOAL #1   Title Pt will be independent with HEP in order to improve strength and balance in order to  decrease fall risk and improve function at home.    Baseline IE: 8/3 no HEP    Time 8    Period Weeks    Status New    Target Date 09/22/21      PT LONG TERM GOAL #2   Title Pt will improve FGA by at least 3 points in order to demonstrate clinically significant improvement in balance and decreased risk for falls.    Baseline IE: FGA: 25    Time 8    Period Weeks    Status New    Target Date 09/22/21      PT LONG TERM GOAL #3   Title Pt will decrease 5xSTS by at least 3 seconds in order to demonstrate clinically significant improvement in LE strength.    Baseline IE: 8/2, 13 seconds    Time 8    Period Weeks    Status New    Target Date 09/22/21      PT LONG TERM GOAL #4   Title Pt will improve B LE MMT by 1/2 grade to facilitate stair clearance and ensure safety.    Baseline IE: Strength  R/L  3+/3+ Hip flexion  4/4 Hip abduction  4/4 Hip adduction  4/4Knee extension  4/4 Knee flexion    Time 8    Period Weeks    Status New    Target Date 09/22/21      PT LONG TERM GOAL #5   Title Pt. will increase FOTO to 56 to improve functional mobility/ safety.    Baseline Initial FOTO: 45    Time 8    Period Weeks    Status New    Target Date 09/22/21                Plan - 09/07/21 1543     Clinical Impression Statement Today's tx was focused on LE strengthening during balanced demanding movements. Pt tolerated progression of nautilus resisted walking with occ min A to prevent falls. Gait belt was donned during all tx. Pt continues to display mark LE/UE endurance, strength, and ROM deficits resulting in decreased safe home/community mobiity, increased pain, and limited access to QOL. POC was adjusted for 1x week for 2 additional weeks. Pt. will continue to benefit from skilled physical therapy to progress POC to address remaining deficits to facilitate maximum functional capacity for optimal personal  health and wellness for ADLs.    Examination-Activity Limitations Stairs     Stability/Clinical Decision Making Stable/Uncomplicated    Clinical Decision Making Low    Rehab Potential Good    PT Frequency 2x / week    PT Duration 8 weeks    PT Treatment/Interventions Manual techniques;Passive range of motion;Therapeutic activities;Functional mobility training;Therapeutic exercise;Balance training;Neuromuscular re-education;Gait training;Stair training;Patient/family education;ADLs/Self Care Home Management    PT Next Visit Plan continued balance training    PT Home Exercise Plan Access Code: [email or web address]    Consulted and Agree with Plan of Care Patient             Patient will benefit from skilled therapeutic intervention in order to improve the following deficits and impairments:  Decreased balance, Decreased activity tolerance, Decreased endurance, Postural dysfunction, Decreased mobility, Decreased strength, Difficulty walking  Visit Diagnosis: Unsteadiness on feet  Muscle weakness (generalized)     Problem List Patient Active Problem List   Diagnosis Date Noted   Biceps tendinitis of right upper extremity 09/02/2021   Acquired trigger finger 01/13/2021   Radial styloid tenosynovitis 01/13/2021   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   LVH (left ventricular hypertrophy) due to hypertensive disease, without heart failure 04/11/2019   Foot pain, right 10/10/2018   Ganglion cyst of left foot 08/29/2018   Xerosis of skin 08/29/2018   Palpitations 08/08/2018   Colon cancer screening    Trochanteric bursitis of left hip 12/07/2016   History of endometrial cancer 08/03/2016   Edema leg 04/13/2016   Type II diabetes mellitus with complication ([place]) [id number]   Shoulder pain, left 02/12/2016   Acid reflux 10/02/2015   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   Hyperlipidemia associated with type 2 diabetes mellitus ([place]) 04/08/2015   Aortic heart valve narrowing  03/26/2015   Bilateral carotid artery stenosis 03/26/2015   Anxiety    Environmental and seasonal allergies    Postmenopausal atrophic vaginitis    Depression, major, recurrent, moderate (HCC)    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 [place], [place], [address] Phone: [phone]   Fax:  [phone]  Name: Samantha Lang MRN: [id number] Date of Birth: 03/31/1941

## 2021-09-09 ENCOUNTER — Encounter: Payer: Medicare HMO | Admitting: Physical Therapy

## 2021-09-10 DIAGNOSIS — S62525A Nondisplaced fracture of distal phalanx of left thumb, initial encounter for closed fracture: Secondary | ICD-10-CM | POA: Diagnosis not present

## 2021-09-10 DIAGNOSIS — S62522A Displaced fracture of distal phalanx of left thumb, initial encounter for closed fracture: Secondary | ICD-10-CM | POA: Diagnosis not present

## 2021-09-10 DIAGNOSIS — M6530 Trigger finger, unspecified finger: Secondary | ICD-10-CM | POA: Diagnosis not present

## 2021-09-14 ENCOUNTER — Other Ambulatory Visit: Payer: Self-pay

## 2021-09-14 ENCOUNTER — Encounter: Payer: Medicare HMO | Admitting: Physical Therapy

## 2021-09-14 ENCOUNTER — Other Ambulatory Visit: Payer: Self-pay | Admitting: Internal Medicine

## 2021-09-14 ENCOUNTER — Encounter: Payer: Self-pay | Admitting: Internal Medicine

## 2021-09-14 ENCOUNTER — Ambulatory Visit (INDEPENDENT_AMBULATORY_CARE_PROVIDER_SITE_OTHER): Payer: Medicare HMO | Admitting: Internal Medicine

## 2021-09-14 VITALS — BP 138/62 | HR 65 | Temp 97.6°F | Ht 62.0 in | Wt 168.0 lb

## 2021-09-14 DIAGNOSIS — E1169 Type 2 diabetes mellitus with other specified complication: Secondary | ICD-10-CM

## 2021-09-14 DIAGNOSIS — I10 Essential (primary) hypertension: Secondary | ICD-10-CM | POA: Diagnosis not present

## 2021-09-14 DIAGNOSIS — Z0000 Encounter for general adult medical examination without abnormal findings: Secondary | ICD-10-CM | POA: Diagnosis not present

## 2021-09-14 DIAGNOSIS — F331 Major depressive disorder, recurrent, moderate: Secondary | ICD-10-CM | POA: Diagnosis not present

## 2021-09-14 DIAGNOSIS — E118 Type 2 diabetes mellitus with unspecified complications: Secondary | ICD-10-CM

## 2021-09-14 DIAGNOSIS — R69 Illness, unspecified: Secondary | ICD-10-CM | POA: Diagnosis not present

## 2021-09-14 DIAGNOSIS — E785 Hyperlipidemia, unspecified: Secondary | ICD-10-CM

## 2021-09-14 DIAGNOSIS — R911 Solitary pulmonary nodule: Secondary | ICD-10-CM | POA: Insufficient documentation

## 2021-09-14 LAB — POCT URINALYSIS DIPSTICK
Bilirubin, UA: NEGATIVE
Blood, UA: NEGATIVE
Glucose, UA: NEGATIVE
Ketones, UA: NEGATIVE
Leukocytes, UA: NEGATIVE
Nitrite, UA: NEGATIVE
Protein, UA: POSITIVE — AB
Spec Grav, UA: 1.025 (ref 1.010–1.025)
Urobilinogen, UA: 0.2 E.U./dL
pH, UA: 5 (ref 5.0–8.0)

## 2021-09-14 LAB — POCT UA - MICROALBUMIN: Microalbumin Ur, POC: 100 mg/L

## 2021-09-14 NOTE — Progress Notes (Signed)
Date:  09/14/2021   Name:  Samantha Lang   DOB:  1941/03/06   MRN:  [id number]   Chief Complaint: Annual Exam (Breast exam no pap) Samantha Lang is a 80 y.o. female who presents today for her Complete Annual Exam. She feels well. She reports exercising none. She reports she is sleeping fairly well. Breast complaints none.  Mammogram: 04/2021 DEXA: 02/2020 osteopenia hip Pap smear: discontinued Colonoscopy: 08/2017 normal  Immunization History  Administered Date(s) Administered   Fluad Quad(high Dose 65+) 10/09/2019   Influenza, High Dose Seasonal PF 11/12/2018   Influenza,inj,Quad PF,6+ Mos 10/02/2015, 11/07/2016, 11/01/2017   Pneumococcal Conjugate-13 02/05/2014   Pneumococcal Polysaccharide-23 06/09/2008   Tdap 06/09/2010, 10/28/2020    Hypertension This is a chronic problem. The problem is controlled. Pertinent negatives include no chest pain, headaches, palpitations or shortness of breath. Past treatments include calcium channel blockers, beta blockers, angiotensin blockers and diuretics. Hypertensive end-organ damage includes CAD/MI. There is no history of kidney disease.  Diabetes She presents for her follow-up diabetic visit. She has type 2 diabetes mellitus. Pertinent negatives for hypoglycemia include no dizziness, headaches, nervousness/anxiousness or tremors. Pertinent negatives for diabetes include no chest pain, no fatigue, no polydipsia and no polyuria. Current diabetic treatment includes oral agent (monotherapy) Celesta Gentile). She is compliant with treatment all of the time. An ACE inhibitor/angiotensin II receptor blocker is being taken.  Hyperlipidemia This is a chronic problem. The problem is controlled. Pertinent negatives include no chest pain or shortness of breath. Current antihyperlipidemic treatment includes statins. The current treatment provides significant improvement of lipids.  Depression        This is a chronic problem.The problem is unchanged.   Associated symptoms include no fatigue and no headaches.  Past treatments include SNRIs - Serotonin and norepinephrine reuptake inhibitors.  Lab Results  Component Value Date   CREATININE 1.00 11/23/2020   BUN 21 11/23/2020   NA 139 11/23/2020   K 3.6 11/23/2020   CL 96 11/23/2020   CO2 27 11/23/2020   Lab Results  Component Value Date   CHOL 159 07/09/2021   HDL 64 07/09/2021   LDLCALC 65 07/09/2021   LDLDIRECT 138.3 09/26/2011   TRIG 181 (H) 07/09/2021   CHOLHDL 2.5 07/09/2021   Lab Results  Component Value Date   TSH 3.190 07/09/2021   Lab Results  Component Value Date   HGBA1C 7.2 (H) 07/09/2021   Lab Results  Component Value Date   WBC 7.4 07/09/2021   HGB 13.3 07/09/2021   HCT 38.5 07/09/2021   MCV 90 07/09/2021   PLT 152 07/09/2021   Lab Results  Component Value Date   ALT 29 11/23/2020   AST 26 11/23/2020   ALKPHOS 89 11/23/2020   BILITOT 0.5 11/23/2020     Review of Systems  Constitutional:  Negative for chills, fatigue and fever.  HENT:  Negative for congestion, hearing loss, tinnitus, trouble swallowing and voice change.   Eyes:  Negative for visual disturbance.  Respiratory:  Negative for cough, chest tightness, shortness of breath and wheezing.   Cardiovascular:  Negative for chest pain, palpitations and leg swelling.  Gastrointestinal:  Negative for abdominal pain, constipation, diarrhea and vomiting.  Endocrine: Negative for polydipsia and polyuria.  Genitourinary:  Negative for dysuria, frequency, genital sores, vaginal bleeding and vaginal discharge.  Musculoskeletal:  Negative for arthralgias, gait problem and joint swelling.  Skin:  Negative for color change and rash.  Neurological:  Negative for dizziness, tremors, light-headedness and headaches.  Hematological:  Negative for adenopathy. Does not bruise/bleed easily.  Psychiatric/Behavioral:  Positive for depression. Negative for dysphoric mood and sleep disturbance. The patient is not  nervous/anxious.    Patient Active Problem List   Diagnosis Date Noted   Biceps tendinitis of right upper extremity 09/02/2021   Acquired trigger finger 01/13/2021   Radial styloid tenosynovitis 01/13/2021   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   LVH (left ventricular hypertrophy) due to hypertensive disease, without heart failure 04/11/2019   Foot pain, right 10/10/2018   Ganglion cyst of left foot 08/29/2018   Xerosis of skin 08/29/2018   Palpitations 08/08/2018   Colon cancer screening    Trochanteric bursitis of left hip 12/07/2016   History of endometrial cancer 08/03/2016   Edema leg 04/13/2016   Type II diabetes mellitus with complication ([place]) 84/16/6063   Shoulder pain, left 02/12/2016   Acid reflux 10/02/2015   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   Hyperlipidemia associated with type 2 diabetes mellitus ([place]) 04/08/2015   Aortic heart valve narrowing 03/26/2015   Bilateral carotid artery stenosis 03/26/2015   Anxiety    Environmental and seasonal allergies    Postmenopausal atrophic vaginitis    Depression, major, recurrent, moderate (HCC)    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 Rosaria Ferries, eyelid lift   POLYPECTOMY  09/22/2017   Procedure: POLYPECTOMY;   Surgeon: Lucilla Lame, MD;  Location: [place];  Service: Gastroenterology;;   TUBAL LIGATION      Social History   Tobacco Use   Smoking status: Former    Packs/day: 0.25    Years: 20.00    Pack years: 5.00    Types: Cigarettes    Quit date: 09/25/1988    Years since quitting: 32.9   Smokeless tobacco: Never  Vaping Use   Vaping Use: Never used  Substance Use Topics   Alcohol use: Yes    Alcohol/week: 6.0 standard drinks    Types: 2 Glasses of wine, 2 Cans of beer, 2 Shots of liquor per week   Drug use: Never     Medication list has been reviewed and updated.  Current Meds  Medication Sig   allopurinol (ZYLOPRIM) 100 MG tablet Take 1 tablet (100 mg total) by mouth daily.   amLODipine (NORVASC) 5 MG tablet TAKE 1 TABLET BY MOUTH TWICE A DAY   aspirin 81 MG tablet Take 81 mg by mouth daily.   atorvastatin (LIPITOR) 10 MG tablet Take 1 tablet by mouth daily.   carvedilol (COREG) 6.25 MG tablet Take 6.25 mg by mouth 2 (two) times daily.   colchicine 0.6 MG tablet Take 0.6 mg by mouth daily. PRN only, takes rarely   diclofenac (VOLTAREN) 50 MG EC tablet  Take 1 tablet (50 mg total) by mouth 2 (two) times daily.   JANUVIA 100 MG tablet TAKE 1 TABLET BY MOUTH EVERY DAY   Multiple Vitamin (MULTIVITAMIN) capsule Take 1 capsule by mouth daily.   pantoprazole (PROTONIX) 40 MG tablet TAKE 1 TABLET BY MOUTH EVERY DAY   psyllium (METAMUCIL SMOOTH TEXTURE) 58.6 % powder Please use one does every other day.   telmisartan-hydrochlorothiazide (MICARDIS HCT) 80-25 MG tablet Take 1 tablet by mouth daily.   venlafaxine XR (EFFEXOR-XR) 150 MG 24 hr capsule TAKE (1) CAPSULE BY MOUTH EVERY DAY   VITAMIN D PO Take 5,000 Units by mouth daily.    PHQ 2/9 Scores 09/14/2021 09/02/2021 07/09/2021 06/16/2021  PHQ - 2 Score 3 4 4 4   PHQ- 9 Score 12 9 11 15     GAD 7 : Generalized Anxiety Score 09/14/2021 09/02/2021 07/09/2021 06/16/2021  Nervous, Anxious, on Edge 1 0 0 0  Control/stop worrying 1 0  0 0  Worry too much - different things 1 0 0 0  Trouble relaxing 1 0 0 0  Restless 1 0 0 0  Easily annoyed or irritable 1 0 0 0  Afraid - awful might happen 1 0 0 0  Total GAD 7 Score 7 0 0 0  Anxiety Difficulty - Not difficult at all - Not difficult at all    BP Readings from Last 3 Encounters:  09/14/21 138/62  09/02/21 124/84  07/09/21 (!) 154/64    Physical Exam Vitals and nursing note reviewed.  Constitutional:      General: She is not in acute distress.    Appearance: She is well-developed.  HENT:     Head: Normocephalic and atraumatic.     Right Ear: Tympanic membrane and ear canal normal.     Left Ear: Tympanic membrane and ear canal normal.     Nose:     Right Sinus: No maxillary sinus tenderness.     Left Sinus: No maxillary sinus tenderness.  Eyes:     General: No scleral icterus.       Right eye: No discharge.        Left eye: No discharge.     Conjunctiva/sclera: Conjunctivae normal.  Neck:     Thyroid: No thyromegaly.     Vascular: No carotid bruit.  Cardiovascular:     Rate and Rhythm: Normal rate and regular rhythm.     Pulses: Normal pulses.     Heart sounds: Normal heart sounds.  Pulmonary:     Effort: Pulmonary effort is normal. No respiratory distress.     Breath sounds: No wheezing.  Chest:  Breasts:    Right: No mass, nipple discharge, skin change or tenderness.     Left: No mass, nipple discharge, skin change or tenderness.  Abdominal:     General: Bowel sounds are normal.     Palpations: Abdomen is soft.     Tenderness: There is no abdominal tenderness.  Musculoskeletal:     Cervical back: Normal range of motion. No erythema.     Right lower leg: No edema.     Left lower leg: No edema.  Lymphadenopathy:     Cervical: No cervical adenopathy.  Skin:    General: Skin is warm and dry.     Findings: No rash.  Neurological:     Mental Status: She is alert and oriented to person, place, and time.     Cranial Nerves: No cranial nerve  deficit.     Sensory: No  sensory deficit.     Deep Tendon Reflexes: Reflexes are normal and symmetric.  Psychiatric:        Attention and Perception: Attention normal.        Mood and Affect: Mood normal.    Wt Readings from Last 3 Encounters:  09/14/21 168 lb (76.2 kg)  09/02/21 168 lb (76.2 kg)  07/09/21 169 lb (76.7 kg)    BP 138/62   Pulse 65   Temp 97.6 F (36.4 C) (Oral)   Ht 5\' 2"  (0.354 m)   Wt 168 lb (76.2 kg)   SpO2 98%   BMI 30.73 kg/m   Assessment and Plan: 1. Annual physical exam Continue healthy diet Begin walking program if able - otherwise work on home exercises advised by PT Screenings and immunizations are up to date other than Covid vaccine  2. Essential hypertension Clinically stable exam with well controlled BP. Tolerating medications without side effects at this time. Pt to continue current regimen and low sodium diet; benefits of regular exercise as able discussed. - POCT urinalysis dipstick  3. Type II diabetes mellitus with complication (HCC) Recent A1C was good.  Tolerating meds well.  No hypoglycemic episodes. Continue to work on diet, exercise - Comprehensive metabolic panel  4. Hyperlipidemia associated with type 2 diabetes mellitus ([place]) Tolerating statin medication without side effects at this time LDL is at goal of < 70 on current dose Continue same therapy without change at this time.  5. Depression, major, recurrent, moderate (HCC) Clinically stable on current regimen with good control of symptoms, No SI or HI. Will continue current therapy.  Partially dictated using Editor, commissioning. Any errors are unintentional.  Halina Maidens, MD [facility]  09/14/2021

## 2021-09-15 ENCOUNTER — Ambulatory Visit: Payer: Medicare HMO | Admitting: Physical Therapy

## 2021-09-15 ENCOUNTER — Encounter: Payer: Self-pay | Admitting: Physical Therapy

## 2021-09-15 DIAGNOSIS — M6281 Muscle weakness (generalized): Secondary | ICD-10-CM

## 2021-09-15 DIAGNOSIS — R2681 Unsteadiness on feet: Secondary | ICD-10-CM

## 2021-09-15 LAB — COMPREHENSIVE METABOLIC PANEL
ALT: 35 IU/L — ABNORMAL HIGH (ref 0–32)
AST: 35 IU/L (ref 0–40)
Albumin/Globulin Ratio: 1.8 (ref 1.2–2.2)
Albumin: 4.2 g/dL (ref 3.7–4.7)
Alkaline Phosphatase: 84 IU/L (ref 44–121)
BUN/Creatinine Ratio: 28 (ref 12–28)
BUN: 22 mg/dL (ref 8–27)
Bilirubin Total: 0.5 mg/dL (ref 0.0–1.2)
CO2: 26 mmol/L (ref 20–29)
Calcium: 9.6 mg/dL (ref 8.7–10.3)
Chloride: 96 mmol/L (ref 96–106)
Creatinine, Ser: 0.8 mg/dL (ref 0.57–1.00)
Globulin, Total: 2.4 g/dL (ref 1.5–4.5)
Glucose: 186 mg/dL — ABNORMAL HIGH (ref 65–99)
Potassium: 3.5 mmol/L (ref 3.5–5.2)
Sodium: 138 mmol/L (ref 134–144)
Total Protein: 6.6 g/dL (ref 6.0–8.5)
eGFR: 75 mL/min/{1.73_m2} (ref >59)

## 2021-09-15 NOTE — Therapy (Signed)
[facility] [address] [place], [place], [address] Phone: [phone]   Fax:  [phone]  Physical Therapy Treatment  Patient Details  Name: Samantha Lang MRN: [id number] Date of Birth: Sep 17, 1941 Referring Provider (PT): Halina Maidens MD   Encounter Date: 09/15/2021   PT End of Session - 09/15/21 1117     Visit Number 9    Number of Visits 16    Date for PT Re-Evaluation 09/22/21    Authorization - Visit Number 9    Authorization - Number of Visits 10    PT Start Time 5597    PT Stop Time 4163    PT Time Calculation (min) 44 min    Activity Tolerance Patient tolerated treatment well;Patient limited by fatigue;No increased pain    Behavior During Therapy WFL for tasks assessed/performed             Past Medical History:  Diagnosis Date   Allergy    Anxiety    Aortic valve stenosis, mild    Depression    mild   Diabetes mellitus age 52   GERD (gastroesophageal reflux disease)    Heart murmur    Hemorrhoids    Hyperlipidemia    Hypertension 2003   Incontinence    Female stress   Mitral incompetence    Personal history of chemotherapy    3 treatments   Personal history of radiation therapy    5 treatments   Postmenopausal atrophic vaginitis    Torn rotator cuff    Uterine cancer [facility])    treated    Past Surgical History:  Procedure Laterality Date   ABDOMINAL HYSTERECTOMY  07/2016   BREAST BIOPSY Right 03/21/08   right, benign    COLONOSCOPY WITH PROPOFOL N/A 09/22/2017   Procedure: COLONOSCOPY WITH PROPOFOL;  Surgeon: Lucilla Lame, MD;  Location: [facility];  Service: Gastroenterology;  Laterality: N/A;  diabetic   EYE SURGERY  Oct 2009   for ptosis,  Dr. Rosaria Ferries, eyelid lift   POLYPECTOMY  09/22/2017   Procedure: POLYPECTOMY;  Surgeon: Lucilla Lame, MD;  Location: [facility];  Service: Gastroenterology;;   TUBAL LIGATION      There were no vitals filed for this visit.    Subjective Assessment - 09/15/21 1119     Subjective Pt present to tx without reports of falls or near falls. Pt states no adherence to HEP. Pt is agreeable to possible d/c next tx. with possible evaluation and change of POC to address R shoulder deficits.    Pertinent History R Rotator cuff injury d/t fall in 09/2021;    Limitations House hold activities;Standing;Walking    How long can you sit comfortably? As needed    How long can you stand comfortably? As needed    How long can you walk comfortably? less than 30 minute    Patient Stated Goals To walk without fall    Currently in Pain? Yes    Pain Score 5     Pain Location Shoulder    Pain Orientation Right    Pain Descriptors / Indicators Sharp;Sore    Pain Onset In the past 7 days                Therapeutic Exercise:   Nustep: S_7, L5, x10 Completed without UE assist to facilitate a LE fatigue before balance training to create a more life-like situation that would cause a fall.   12'' toe taps 4 x 1 mins  with 4# ankles weights bilat. Verbal and contact cueing to facilitate optimal tissue loading and correct biomechanical alignment to ensure efficient and safe movement. Sets 1+2 without airex pad. Sets 3+4 with airex pad.   Nautilus resisted lateral walking. CGA-Mod A when LOB with gait belt. 40# each direction. Extensive verbal cueing to facilitate proper step length and high quality mobility. 4 laps each direction   Reviewed HEP/ encouraged pt. To be more compliant with HEP      PT Education - 09/15/21 1121     Education Details Pt was edcuated on proper LE mobility and alignment during stability exercises to reduce fall risk.    Person(s) Educated Patient    Methods Explanation;Demonstration    Comprehension Verbalized understanding                 PT Long Term Goals - 07/28/21 1000       PT LONG TERM GOAL #1   Title Pt will be independent with HEP in order to improve strength and balance in order to  decrease fall risk and improve function at home.    Baseline IE: 8/3 no HEP    Time 8    Period Weeks    Status New    Target Date 09/22/21      PT LONG TERM GOAL #2   Title Pt will improve FGA by at least 3 points in order to demonstrate clinically significant improvement in balance and decreased risk for falls.    Baseline IE: FGA: 25    Time 8    Period Weeks    Status New    Target Date 09/22/21      PT LONG TERM GOAL #3   Title Pt will decrease 5xSTS by at least 3 seconds in order to demonstrate clinically significant improvement in LE strength.    Baseline IE: 8/2, 13 seconds    Time 8    Period Weeks    Status New    Target Date 09/22/21      PT LONG TERM GOAL #4   Title Pt will improve B LE MMT by 1/2 grade to facilitate stair clearance and ensure safety.    Baseline IE: Strength  R/L  3+/3+ Hip flexion  4/4 Hip abduction  4/4 Hip adduction  4/4Knee extension  4/4 Knee flexion    Time 8    Period Weeks    Status New    Target Date 09/22/21      PT LONG TERM GOAL #5   Title Pt. will increase FOTO to 56 to improve functional mobility/ safety.    Baseline Initial FOTO: 45    Time 8    Period Weeks    Status New    Target Date 09/22/21                Plan - 09/15/21 1117     Clinical Impression Statement Today's tx was focused on balance training under resisted loading. Pt displayed occasional LOB during nautilus walking (L>R) and required mod A to prevent a fall.  Pt continues to displayed reduced steadiness on feet when duel tasking is facilitated. Pt continues to display mark LE endurance, strength, and ROM deficits resulting in decreased safe home/community mobiity, increased pain, and limited access to QOL. Pt. will continue to benefit from skilled physical therapy to progress POC to address remaining deficits to facilitate maximum functional capacity for optimal personal health and wellness for ADLs.    Examination-Activity Limitations Stairs  Stability/Clinical Decision Making Stable/Uncomplicated    Clinical Decision Making Low    Rehab Potential Good    PT Frequency 2x / week    PT Duration 8 weeks    PT Treatment/Interventions Manual techniques;Passive range of motion;Therapeutic activities;Functional mobility training;Therapeutic exercise;Balance training;Neuromuscular re-education;Gait training;Stair training;Patient/family education;ADLs/Self Care Home Management    PT Next Visit Plan Continued balance training    PT Home Exercise Plan Access Code: [email or web address]    Consulted and Agree with Plan of Care Patient             Patient will benefit from skilled therapeutic intervention in order to improve the following deficits and impairments:  Decreased balance, Decreased activity tolerance, Decreased endurance, Postural dysfunction, Decreased mobility, Decreased strength, Difficulty walking  Visit Diagnosis: Unsteadiness on feet  Muscle weakness (generalized)     Problem List 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   LVH (left ventricular hypertrophy) due to hypertensive disease, without heart failure 04/11/2019   Foot pain, right 10/10/2018   Ganglion cyst of left foot 08/29/2018   Xerosis of skin 08/29/2018   Palpitations 08/08/2018   Colon cancer screening    Trochanteric bursitis of left hip 12/07/2016   History of endometrial cancer 08/03/2016   Edema leg 04/13/2016   Type II diabetes mellitus with complication ([place]) 58/25/1898   Shoulder pain, left 02/12/2016   Acid reflux 10/02/2015   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   Hyperlipidemia associated with type 2 diabetes mellitus ([place])  04/08/2015   Aortic heart valve narrowing 03/26/2015   Bilateral carotid artery stenosis 03/26/2015   Anxiety    Environmental and seasonal allergies    Postmenopausal atrophic vaginitis    Depression, major, recurrent, moderate (HCC)    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 [place], [place], [address] Phone: [phone]   Fax:  [phone]  Name: Samantha Lang MRN: [id number] Date of Birth: [birth year]

## 2021-09-16 ENCOUNTER — Other Ambulatory Visit: Payer: Self-pay

## 2021-09-16 ENCOUNTER — Inpatient Hospital Stay (INDEPENDENT_AMBULATORY_CARE_PROVIDER_SITE_OTHER): Payer: Medicare HMO | Admitting: Radiology

## 2021-09-16 ENCOUNTER — Encounter: Payer: Medicare HMO | Admitting: Physical Therapy

## 2021-09-16 ENCOUNTER — Encounter: Payer: Self-pay | Admitting: Family Medicine

## 2021-09-16 ENCOUNTER — Ambulatory Visit (INDEPENDENT_AMBULATORY_CARE_PROVIDER_SITE_OTHER): Payer: Medicare HMO | Admitting: Family Medicine

## 2021-09-16 VITALS — BP 108/64 | HR 66 | Temp 97.9°F | Ht 62.0 in | Wt 167.0 lb

## 2021-09-16 DIAGNOSIS — M7521 Bicipital tendinitis, right shoulder: Secondary | ICD-10-CM | POA: Diagnosis not present

## 2021-09-16 DIAGNOSIS — M12811 Other specific arthropathies, not elsewhere classified, right shoulder: Secondary | ICD-10-CM

## 2021-09-16 DIAGNOSIS — M75101 Unspecified rotator cuff tear or rupture of right shoulder, not specified as traumatic: Secondary | ICD-10-CM | POA: Diagnosis not present

## 2021-09-16 MED ORDER — DICLOFENAC SODIUM 50 MG PO TBEC: 50.0000 mg | DELAYED_RELEASE_TABLET | Freq: Two times a day (BID) | ORAL | 0 refills | Status: DC | PRN

## 2021-09-16 MED ORDER — TRIAMCINOLONE ACETONIDE 40 MG/ML IJ SUSP
100.0000 mg | Freq: Once | INTRAMUSCULAR | Status: AC
  Administered 2021-09-16: 100 mg

## 2021-09-16 NOTE — Patient Instructions (Signed)
You have just been given a cortisone injection to reduce pain and inflammation. After the injection you may notice immediate relief of pain as a result of the Lidocaine. It is important to rest the area of the injection for 24 to 48 hours after the injection. There is a possibility of some temporary increased discomfort and swelling for up to 72 hours until the cortisone begins to work. If you do have pain, simply rest the joint and use ice. If you can tolerate over the counter medications, you can try Tylenol, Aleve, or Advil for added relief per package instructions. - As above, relative rest for 2 days then gradual return to normal activity - Can continue diclofenac on an as-needed basis as an adjunct to physical therapy and home exercises - Start/continue physical therapy with the referral provided - Return for follow-up in 6 weeks - Contact us for any questions between now and then

## 2021-09-16 NOTE — Assessment & Plan Note (Signed)
Patient with interval stated improvement though objectively persistent dysfunction noted with decreased range of motion, 4/5 strength with isolated supraspinatus testing, compounded by pain, tenderness at the subacromial and bicipital groove with positive impingement and speeds, Yergason's testing.  Given her persistent symptomatology I have reviewed additional treatment strategies and she is amenable to ultrasound-guided subacromial injection and biceps tendon sheath injection.  She will continue diclofenac on an as-needed basis, start formal physical therapy, and we will coordinate follow-up in 6 weeks for reevaluation.

## 2021-09-16 NOTE — Progress Notes (Signed)
Primary Care / Sports Medicine Office Visit  Patient Information:  Patient ID: SHANVI MOYD, female DOB: October 28, 1941 Age: 80 y.o. MRN: [id number]   GEM CONKLE is a pleasant 80 y.o. female presenting with the following:  Chief Complaint  Patient presents with   Right rotator cuff tear arthropathy    Taking diclofenac twice daily, following with PT, and alternating heat/cold as tolerated with some relief; 3/10 pain    Review of Systems pertinent details above   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   LVH (left ventricular hypertrophy) due to hypertensive disease, without heart failure 04/11/2019   Foot pain, right 10/10/2018   Ganglion cyst of left foot 08/29/2018   Xerosis of skin 08/29/2018   Palpitations 08/08/2018   Colon cancer screening    Trochanteric bursitis of left hip 12/07/2016   History of endometrial cancer 08/03/2016   Edema leg 04/13/2016   Type II diabetes mellitus with complication ([place]) 44/12/270   Shoulder pain, left 02/12/2016   Acid reflux 10/02/2015   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   Hyperlipidemia associated with type 2 diabetes mellitus ([place]) 04/08/2015   Aortic heart valve narrowing 03/26/2015   Bilateral carotid artery stenosis 03/26/2015   Anxiety    Environmental and seasonal allergies    Postmenopausal atrophic vaginitis    Depression, major, recurrent, moderate (HCC)    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 ([place])    Essential hypertension    Past Medical History:  Diagnosis Date   Allergy    Anxiety    Aortic valve stenosis, mild    Depression    mild   Diabetes mellitus age 44    GERD (gastroesophageal reflux disease)    Heart murmur    Hemorrhoids    Hyperlipidemia    Hypertension 2003   Incontinence    Female stress   Mitral incompetence    Personal history of chemotherapy    3 treatments   Personal history of radiation therapy    5 treatments   Postmenopausal atrophic vaginitis    Torn rotator cuff    Uterine cancer ([place])    treated   Outpatient Encounter Medications as of 09/16/2021  Medication Sig   allopurinol (ZYLOPRIM) 100 MG tablet Take 1 tablet (100 mg total) by mouth daily.   amLODipine (NORVASC) 5 MG tablet TAKE 1 TABLET BY MOUTH TWICE A DAY   aspirin 81 MG tablet Take 81 mg by mouth daily.   atorvastatin (LIPITOR) 10 MG tablet Take 1 tablet by mouth daily.   carvedilol (COREG) 6.25 MG tablet Take 6.25 mg by mouth 2 (two) times daily.   colchicine 0.6 MG tablet Take 0.6 mg by mouth daily. PRN only, takes rarely   JANUVIA 100 MG tablet TAKE 1 TABLET BY MOUTH EVERY DAY   Multiple Vitamin (MULTIVITAMIN) capsule Take 1 capsule by mouth daily.   pantoprazole (PROTONIX) 40 MG tablet TAKE 1 TABLET BY MOUTH EVERY DAY   psyllium (METAMUCIL SMOOTH TEXTURE) 58.6 % powder Please use one does every other day.   telmisartan-hydrochlorothiazide (MICARDIS HCT) 80-25 MG tablet Take 1 tablet by mouth daily.   venlafaxine XR (EFFEXOR-XR) 150 MG 24  hr capsule TAKE (1) CAPSULE BY MOUTH EVERY DAY   VITAMIN D PO Take 5,000 Units by mouth daily.   [DISCONTINUED] diclofenac (VOLTAREN) 50 MG EC tablet Take 1 tablet (50 mg total) by mouth 2 (two) times daily.   diclofenac (VOLTAREN) 50 MG EC tablet Take 1 tablet (50 mg total) by mouth 2 (two) times daily as needed.   [EXPIRED] triamcinolone acetonide (KENALOG-40) injection 100 mg    No facility-administered encounter medications on file as of 09/16/2021.   Past Surgical History:  Procedure Laterality Date   ABDOMINAL HYSTERECTOMY  07/2016   BREAST BIOPSY Right 03/21/08   right, benign    COLONOSCOPY WITH PROPOFOL  N/A 09/22/2017   Procedure: COLONOSCOPY WITH PROPOFOL;  Surgeon: Lucilla Lame, MD;  Location: [facility];  Service: Gastroenterology;  Laterality: N/A;  diabetic   EYE SURGERY  Oct 2009   for ptosis,  Dr. Rosaria Ferries, eyelid lift   POLYPECTOMY  09/22/2017   Procedure: POLYPECTOMY;  Surgeon: Lucilla Lame, MD;  Location: [facility];  Service: Gastroenterology;;   TUBAL LIGATION      Vitals:   09/16/21 1328  BP: 108/64  Pulse: 66  Temp: 97.9 F (36.6 C)  SpO2: 98%   Vitals:   09/16/21 1328  Weight: 167 lb (75.8 kg)  Height: 5\' 2"  (1.575 m)   Body mass index is 30.54 kg/m.  DG Shoulder Right  Result Date: 09/03/2021 CLINICAL DATA:  RIGHT shoulder pain, chronic. History of torn rotator cuff, unable to lift arm up EXAM: RIGHT SHOULDER - 2+ VIEW COMPARISON:  RIGHT humeral radiographs 10/28/2020 FINDINGS: Osseous demineralization. AC joint alignment normal. No acute fracture, dislocation, or bone destruction. Visualized ribs intact. IMPRESSION: No acute abnormalities. Electronically Signed   By: Lavonia Dana M.D.   On: 09/03/2021 13:40     Independent interpretation of notes and tests performed by another provider:   None  Procedures performed:   Procedure:  Injection of right subacromial space under ultrasound guidance. Ultrasound guidance utilized for needle placement, hypoechoic response from injectate Samsung HS60 device utilized with permanent recording / reporting. Consent obtained and verified. Skin prepped in a sterile fashion. Ethyl chloride spray for topical local analgesia.  Completed without difficulty and tolerated well. Medication: triamcinolone acetonide 40 mg/mL suspension for injection 1.5 mL total and 2 mL lidocaine 1% without epinephrine utilized for needle placement anesthetic Advised to contact for fevers/chills, erythema, induration, drainage, or persistent bleeding.  Procedure:  Injection of biceps tendon sheath under ultrasound  guidance. Ultrasound guidance utilized for out of plane approach injection to the biceps tendon sheath, noted hypoechoic response Samsung HS60 device utilized with permanent recording / reporting. Consent obtained and verified. Skin prepped in a sterile fashion. Ethyl chloride spray for topical local analgesia.  Completed without difficulty and tolerated well. Medication: triamcinolone acetonide 40 mg/mL suspension for injection 1 mL total and 2 mL lidocaine 1% without epinephrine utilized for needle placement anesthetic Advised to contact for fevers/chills, erythema, induration, drainage, or persistent bleeding.   Pertinent History, Exam, Impression, and Recommendations:   Right rotator cuff tear arthropathy Patient with interval stated improvement though objectively persistent dysfunction noted with decreased range of motion, 4/5 strength with isolated supraspinatus testing, compounded by pain, tenderness at the subacromial and bicipital groove with positive impingement and speeds, Yergason's testing.  Given her persistent symptomatology I have reviewed additional treatment strategies and she is amenable to ultrasound-guided subacromial injection and biceps tendon sheath injection.  She will continue diclofenac on an as-needed basis, start  formal physical therapy, and we will coordinate follow-up in 6 weeks for reevaluation.  Biceps tendinitis of right upper extremity See additional assessment(s) for plan details.   Orders & Medications Meds ordered this encounter  Medications   diclofenac (VOLTAREN) 50 MG EC tablet    Sig: Take 1 tablet (50 mg total) by mouth 2 (two) times daily as needed.    Dispense:  60 tablet    Refill:  0   triamcinolone acetonide (KENALOG-40) injection 100 mg   Orders Placed This Encounter  Procedures   [place] LIMITED JOINT SPACE STRUCTURES UP RIGHT   Ambulatory referral to Physical Therapy     No follow-ups on file.     Montel Culver, MD   Primary Care  Sports Medicine [place]

## 2021-09-16 NOTE — Assessment & Plan Note (Signed)
See additional assessment(s) for plan details. 

## 2021-09-21 ENCOUNTER — Encounter: Payer: Self-pay | Admitting: Physical Therapy

## 2021-09-21 ENCOUNTER — Ambulatory Visit: Payer: Medicare HMO | Admitting: Physical Therapy

## 2021-09-21 ENCOUNTER — Other Ambulatory Visit: Payer: Self-pay

## 2021-09-21 DIAGNOSIS — R2681 Unsteadiness on feet: Secondary | ICD-10-CM | POA: Diagnosis not present

## 2021-09-21 DIAGNOSIS — M6281 Muscle weakness (generalized): Secondary | ICD-10-CM | POA: Diagnosis not present

## 2021-09-21 NOTE — Therapy (Signed)
[facility] [address] [place], [place], [address] Phone: [phone]   Fax:  [phone]  Physical Therapy Treatment and progress note 07/27/21-09/21/21  Patient Details  Name: Samantha Lang MRN: [id number] Date of Birth: 08/22/1941 Referring Provider (PT): Laura Berglund MD   Encounter Date: 09/21/2021   PT End of Session - 09/21/21 1437     Visit Number 10    Number of Visits 20    Date for PT Re-Evaluation 10/26/21    Authorization - Visit Number 10    Authorization - Number of Visits 10    PT Start Time 1437    PT Stop Time 1515    PT Time Calculation (min) 38 min    Activity Tolerance Patient tolerated treatment well;Patient limited by fatigue;No increased pain    Behavior During Therapy WFL for tasks assessed/performed             Past Medical History:  Diagnosis Date   Allergy    Anxiety    Aortic valve stenosis, mild    Depression    mild   Diabetes mellitus age 62   GERD (gastroesophageal reflux disease)    Heart murmur    Hemorrhoids    Hyperlipidemia    Hypertension 2003   Incontinence    Female stress   Mitral incompetence    Personal history of chemotherapy    3 treatments   Personal history of radiation therapy    5 treatments   Postmenopausal atrophic vaginitis    Torn rotator cuff    Uterine cancer (HCC)    treated    Past Surgical History:  Procedure Laterality Date   ABDOMINAL HYSTERECTOMY  07/2016   BREAST BIOPSY Right 03/21/08   right, benign    COLONOSCOPY WITH PROPOFOL N/A 09/22/2017   Procedure: COLONOSCOPY WITH PROPOFOL;  Surgeon: Wohl, Darren, MD;  Location: [facility];  Service: Gastroenterology;  Laterality: N/A;  diabetic   EYE SURGERY  Oct 2009   for ptosis,  Dr. marion, eyelid lift   POLYPECTOMY  09/22/2017   Procedure: POLYPECTOMY;  Surgeon: Wohl, Darren, MD;  Location: [facility];  Service: Gastroenterology;;   TUBAL LIGATION      There were no  vitals filed for this visit.   Subjective Assessment - 09/21/21 1436     Subjective Pt presents to tx with reduction in R shoulder pain post injection. Pt is very happy with Dr. Matthews who completed the injection. Pt is agreeable to reassess of balance today with evaluation of R shoulder 09/23/21.    Pertinent History R Rotator cuff injury d/t fall in 09/2021;    Limitations House hold activities;Standing;Walking    How long can you sit comfortably? As needed    How long can you stand comfortably? As needed    How long can you walk comfortably? less than 30 minute    Patient Stated Goals To walk without fall    Currently in Pain? Yes    Pain Score 3     Pain Location Shoulder    Pain Orientation Right    Pain Descriptors / Indicators Sharp;Sore    Pain Type Acute pain    Pain Onset In the past 7 days                Treatment and Reassessment:   NuStep L4 with bilat UE assist 8 mins to promote blood flow and reduction of synovial fluid viscosity, for safer and better adherence to   PT tx.    HEP adherence: Pt as been provided a HEP but states poor adherence.  FGA: 22  5xSTS: 13 seconds  MMT: Strength  R/L  4-/4+- Hip flexion,  5/5 Hip abduction  5/5 Hip adduction  5/5 Knee extension  5/5 Knee flexion  FOTO: 58     PT Education - 09/21/21 1519     Education Details Pt was educated on current balance POC progression with addition of R shoulder POC on 09/23/21.    Person(s) Educated Patient    Methods Explanation;Demonstration    Comprehension Verbalized understanding                 PT Long Term Goals - 09/21/21 1441       PT LONG TERM GOAL #1   Title Pt will be independent with HEP in order to improve strength and balance in order to decrease fall risk and improve function at home.    Baseline IE: 8/3 no HEP 9/27: Pt as been provided a HEP but states poor adherence.    Time 5    Period Weeks    Status Not Met    Target Date 10/26/21      PT LONG  TERM GOAL #2   Title Pt will improve FGA by at least 3 points in order to demonstrate clinically significant improvement in balance and decreased risk for falls.    Baseline IE: FGA: 25 9/27: 22    Time 5    Period Weeks    Status Not Met    Target Date 10/26/21      PT LONG TERM GOAL #3   Title Pt will decrease 5xSTS by at least 3 seconds in order to demonstrate clinically significant improvement in LE strength.    Baseline IE: 8/2, 13 seconds 9/27: 13 seconds    Time 5    Period Weeks    Status Not Met    Target Date 10/26/21      PT LONG TERM GOAL #4   Title Pt will improve B LE MMT by 1/2 grade to facilitate stair clearance and ensure safety.    Baseline IE: Strength  R/L  3+/3+ Hip flexion  4/4 Hip abduction  4/4 Hip adduction  4/4Knee extension  4/4 Knee flexion 9/27: Strength  R/L  4-/4+- Hip flexion,  5/5 Hip abduction  5/5 Hip adduction  5/5 Knee extension  5/5 Knee flexion    Time 8    Period Weeks    Status Achieved    Target Date 09/21/21      PT LONG TERM GOAL #5   Title Pt. will increase FOTO to 56 to improve functional mobility/ safety.    Baseline Initial FOTO: 45 9/27: 58 (achieved 10th visit)    Time 8    Period Weeks    Status New    Target Date 09/21/21                   Plan - 09/21/21 1437     Clinical Impression Statement Pt was reassessed on this date 09/21/21. PT examination displayed the following findings: HEP adherence: Pt as been provided a HEP but states poor adherence. FGA: 22. 5xSTS: 13 sec. MMT: Strength  R/L  4-/4+- Hip flexion,  5/5 Hip abduction  5/5 Hip adduction  5/5 Knee extension  5/5 Knee flexion. FOTO: 41. Pt achieved MMT and FOTO goals. PT displayed no improvement in 5xsts, and slight reduction in FGA score. Currently POC is to  continue balance POC with evaluation and addition of R shoulder POC on 09/23/21 with a 5 week duration at 2x/week. Pt continues to display mark LE/UE endurance, strength, and ROM deficits resulting in  decreased safe home/community mobiity, increased pain, and limited access to QOL. Pt. will continue to benefit from skilled physical therapy to progress POC to address remaining deficits to facilitate maximum functional capacity for optimal personal health and wellness for ADLs.    Examination-Activity Limitations Stairs    Stability/Clinical Decision Making Stable/Uncomplicated    Clinical Decision Making Low    Rehab Potential Good    PT Frequency 2x / week    PT Duration 8 weeks   5 weeks   PT Treatment/Interventions Manual techniques;Passive range of motion;Therapeutic activities;Functional mobility training;Therapeutic exercise;Balance training;Neuromuscular re-education;Gait training;Stair training;Patient/family education;ADLs/Self Care Home Management    PT Next Visit Plan assess R shoulder and create POC.    PT Home Exercise Plan Access Code: [email or web address]    Consulted and Agree with Plan of Care Patient             Patient will benefit from skilled therapeutic intervention in order to improve the following deficits and impairments:  Decreased balance, Decreased activity tolerance, Decreased endurance, Postural dysfunction, Decreased mobility, Decreased strength, Difficulty walking  Visit Diagnosis: Unsteadiness on feet  Muscle weakness (generalized)     Problem List 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   LVH (left ventricular hypertrophy) due to hypertensive disease, without heart failure 04/11/2019   Foot pain, right 10/10/2018   Ganglion cyst of left foot 08/29/2018   Xerosis of skin 08/29/2018   Palpitations 08/08/2018   Colon cancer screening    Trochanteric bursitis of left hip 12/07/2016    History of endometrial cancer 08/03/2016   Edema leg 04/13/2016   Type II diabetes mellitus with complication (HCC) 03/14/2016   Shoulder pain, left 02/12/2016   Acid reflux 10/02/2015   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   Hyperlipidemia associated with type 2 diabetes mellitus (HCC) 04/08/2015   Aortic heart valve narrowing 03/26/2015   Bilateral carotid artery stenosis 03/26/2015   Anxiety    Environmental and seasonal allergies    Postmenopausal atrophic vaginitis    Depression, major, recurrent, moderate (HCC)    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 [place], [place], [address] Phone: [phone]   Fax:  [phone]  Name: Samantha Lang MRN: [id number] Date of Birth: 08/21/1941    

## 2021-09-23 ENCOUNTER — Encounter: Payer: Medicare HMO | Admitting: Physical Therapy

## 2021-09-23 ENCOUNTER — Ambulatory Visit: Payer: Medicare HMO | Admitting: Physical Therapy

## 2021-09-28 ENCOUNTER — Other Ambulatory Visit: Payer: Self-pay

## 2021-09-28 ENCOUNTER — Ambulatory Visit: Payer: Medicare HMO | Attending: Family Medicine

## 2021-09-28 DIAGNOSIS — R2681 Unsteadiness on feet: Secondary | ICD-10-CM | POA: Diagnosis not present

## 2021-09-28 DIAGNOSIS — M6281 Muscle weakness (generalized): Secondary | ICD-10-CM | POA: Insufficient documentation

## 2021-09-28 NOTE — Therapy (Signed)
[facility] [address] [place], [place], [address] Phone: [phone]   Fax:  [phone]  Physical Therapy Treatment  Patient Details  Name: Samantha Lang MRN: [id number] Date of Birth: [birth year] Referring Provider (PT): Halina Maidens MD   Encounter Date: 09/28/2021   PT End of Session - 09/28/21 1712     Visit Number 11    Number of Visits 20    Date for PT Re-Evaluation 10/26/21    Authorization - Visit Number 1    Authorization - Number of Visits 10    PT Start Time 1532    PT Stop Time 1602    PT Time Calculation (min) 30 min    Activity Tolerance Patient tolerated treatment well;Patient limited by fatigue;No increased pain    Behavior During Therapy WFL for tasks assessed/performed             Past Medical History:  Diagnosis Date   Allergy    Anxiety    Aortic valve stenosis, mild    Depression    mild   Diabetes mellitus age 68   GERD (gastroesophageal reflux disease)    Heart murmur    Hemorrhoids    Hyperlipidemia    Hypertension 2003   Incontinence    Female stress   Mitral incompetence    Personal history of chemotherapy    3 treatments   Personal history of radiation therapy    5 treatments   Postmenopausal atrophic vaginitis    Torn rotator cuff    Uterine cancer [facility])    treated    Past Surgical History:  Procedure Laterality Date   ABDOMINAL HYSTERECTOMY  07/2016   BREAST BIOPSY Right 03/21/08   right, benign    COLONOSCOPY WITH PROPOFOL N/A 09/22/2017   Procedure: COLONOSCOPY WITH PROPOFOL;  Surgeon: Lucilla Lame, MD;  Location: [facility];  Service: Gastroenterology;  Laterality: N/A;  diabetic   EYE SURGERY  Oct 2009   for ptosis,  Dr. Rosaria Ferries, eyelid lift   POLYPECTOMY  09/22/2017   Procedure: POLYPECTOMY;  Surgeon: Lucilla Lame, MD;  Location: [place];  Service: Gastroenterology;;   TUBAL LIGATION      There were no vitals filed for this visit.    Subjective Assessment - 09/28/21 1708     Subjective Pt present to tx with a excellent reduction in R shoulder pain s/p injection. Pt would like to push back shoulder screening/eval until next tx 2/2 fatigue and late arrival to tx. Pt stated no falls or near falls from last tx.    Pertinent History R Rotator cuff injury d/t fall in 09/2021;    Limitations House hold activities;Standing;Walking    How long can you sit comfortably? As needed    How long can you stand comfortably? As needed    How long can you walk comfortably? less than 30 minute    Patient Stated Goals To walk without fall    Currently in Pain? Yes    Pain Score 3     Pain Location Shoulder    Pain Orientation Right    Pain Onset In the past 7 days               Therapeutic Exercise:   Nustep: S_7, L5, x10 Completed without UE assist to facilitate a LE fatigue before balance training to create a more life-like situation that would cause a fall.   // Bars with gait belt donned and SBA-CGA. 3 laps each  movements. Verbal and contact cueing to facilitate optimal tissue loading and correct biomechanical alignment to ensure efficient and safe movement. Occ light touch assist at // bars  1) 6 '' hurdles  forward  2) 6'' hurdles lateral  3)  lateral toe tapping from long airex  4) forward and backward walking with narrow base of support on long airex.          PT Long Term Goals - 09/21/21 1441       PT LONG TERM GOAL #1   Title Pt will be independent with HEP in order to improve strength and balance in order to decrease fall risk and improve function at home.    Baseline IE: 8/3 no HEP 9/27: Pt as been provided a HEP but states poor adherence.    Time 5    Period Weeks    Status Not Met    Target Date 10/26/21      PT LONG TERM GOAL #2   Title Pt will improve FGA by at least 3 points in order to demonstrate clinically significant improvement in balance and decreased risk for falls.    Baseline IE: FGA: 25  9/27: 22    Time 5    Period Weeks    Status Not Met    Target Date 10/26/21      PT LONG TERM GOAL #3   Title Pt will decrease 5xSTS by at least 3 seconds in order to demonstrate clinically significant improvement in LE strength.    Baseline IE: 8/2, 13 seconds 9/27: 13 seconds    Time 5    Period Weeks    Status Not Met    Target Date 10/26/21      PT LONG TERM GOAL #4   Title Pt will improve B LE MMT by 1/2 grade to facilitate stair clearance and ensure safety.    Baseline IE: Strength  R/L  3+/3+ Hip flexion  4/4 Hip abduction  4/4 Hip adduction  4/4Knee extension  4/4 Knee flexion 9/27: Strength  R/L  4-/4+- Hip flexion,  5/5 Hip abduction  5/5 Hip adduction  5/5 Knee extension  5/5 Knee flexion    Time 8    Period Weeks    Status Achieved    Target Date 09/21/21      PT LONG TERM GOAL #5   Title Pt. will increase FOTO to 56 to improve functional mobility/ safety.    Baseline Initial FOTO: 45 9/27: 58 (achieved 10th visit)    Time 8    Period Weeks    Status New    Target Date 09/21/21                   Plan - 09/28/21 1713     Clinical Impression Statement Today's POC was shifted from shoulder screen/evaluation to standard balance tx 2/2 pt delayed arrival to tx and pt reports fatigue. Pt was able to complete all mobility during tx with occ trips but no falls. Pt continues to displayed increased fall risk during duel tasking facilitate with general conversation. Pt continues to display mark LE/UE endurance, strength, and ROM deficits resulting in decreased safe home/community mobiity, increased pain, and limited access to QOL. Pt. will continue to benefit from skilled physical therapy to progress POC to address remaining deficits to facilitate maximum functional capacity for optimal personal health and wellness for ADLs.    Examination-Activity Limitations Stairs    Stability/Clinical Decision Making Stable/Uncomplicated    Clinical Decision Making Low  Rehab  Potential Good    PT Frequency 2x / week    PT Duration 8 weeks   5 weeks   PT Treatment/Interventions Manual techniques;Passive range of motion;Therapeutic activities;Functional mobility training;Therapeutic exercise;Balance training;Neuromuscular re-education;Gait training;Stair training;Patient/family education;ADLs/Self Care Home Management    PT Next Visit Plan assess R shoulder and create POC.    PT Home Exercise Plan Access Code: [email or web address]    Consulted and Agree with Plan of Care Patient             Patient will benefit from skilled therapeutic intervention in order to improve the following deficits and impairments:  Decreased balance, Decreased activity tolerance, Decreased endurance, Postural dysfunction, Decreased mobility, Decreased strength, Difficulty walking  Visit Diagnosis: Unsteadiness on feet  Muscle weakness (generalized)     Problem List 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   LVH (left ventricular hypertrophy) due to hypertensive disease, without heart failure 04/11/2019   Foot pain, right 10/10/2018   Ganglion cyst of left foot 08/29/2018   Xerosis of skin 08/29/2018   Palpitations 08/08/2018   Colon cancer screening    Trochanteric bursitis of left hip 12/07/2016   History of endometrial cancer 08/03/2016   Edema leg 04/13/2016   Type II diabetes mellitus with complication ([place]) 56/81/2751   Shoulder pain, left 02/12/2016   Acid reflux 10/02/2015   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   Hyperlipidemia associated with type 2 diabetes mellitus ([place]) 04/08/2015   Aortic heart valve narrowing 03/26/2015   Bilateral carotid artery stenosis  03/26/2015   Anxiety    Environmental and seasonal allergies    Postmenopausal atrophic vaginitis    Depression, major, recurrent, moderate (HCC)    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 [place], [place], [address] Phone: [phone]   Fax:  [phone]  Name: TREMEKA HELBLING MRN: [id number] Date of Birth: [birth year]

## 2021-09-30 ENCOUNTER — Encounter: Payer: Self-pay | Admitting: Physical Therapy

## 2021-09-30 ENCOUNTER — Ambulatory Visit: Payer: Medicare HMO | Admitting: Physical Therapy

## 2021-09-30 ENCOUNTER — Other Ambulatory Visit: Payer: Self-pay

## 2021-09-30 DIAGNOSIS — R2681 Unsteadiness on feet: Secondary | ICD-10-CM

## 2021-09-30 DIAGNOSIS — M6281 Muscle weakness (generalized): Secondary | ICD-10-CM

## 2021-09-30 NOTE — Patient Instructions (Signed)
Access Code: [email or web address] URL: [email or web address] Date: 09/30/2021 Prepared by: Dorcas Carrow  Exercises  Seated Isometric Elbow Flexion - 1 x daily - 5 x weekly - 5 reps - 45 hold Seated Elbow Flexion with Self-Anchored Resistance - 1 x daily - 5 x weekly - 3 sets - 8 reps - 3 sec up: 3 sec down hold

## 2021-09-30 NOTE — Therapy (Signed)
[place] [facility] [address] [place], [place], [address] Phone: [phone]   Fax:  [phone]  Physical Therapy Treatment and R shoulder assessment 09/30/2021  Patient Details  Name: Samantha Lang MRN: [id number] Date of Birth: 1941-08-12 Referring Provider (PT): Halina Maidens MD   Encounter Date: 09/30/2021   PT End of Session - 09/30/21 1734     Visit Number 12    Number of Visits 20    Date for PT Re-Evaluation 10/26/21    Authorization - Visit Number 2    Authorization - Number of Visits 10    PT Start Time 3254    PT Stop Time 1513    PT Time Calculation (min) 49 min    Activity Tolerance Patient tolerated treatment well;Patient limited by fatigue;No increased pain    Behavior During Therapy WFL for tasks assessed/performed             Past Medical History:  Diagnosis Date   Allergy    Anxiety    Aortic valve stenosis, mild    Depression    mild   Diabetes mellitus age 33   GERD (gastroesophageal reflux disease)    Heart murmur    Hemorrhoids    Hyperlipidemia    Hypertension 2003   Incontinence    Female stress   Mitral incompetence    Personal history of chemotherapy    3 treatments   Personal history of radiation therapy    5 treatments   Postmenopausal atrophic vaginitis    Torn rotator cuff    Uterine cancer [facility])    treated    Past Surgical History:  Procedure Laterality Date   ABDOMINAL HYSTERECTOMY  07/2016   BREAST BIOPSY Right 03/21/08   right, benign    COLONOSCOPY WITH PROPOFOL N/A 09/22/2017   Procedure: COLONOSCOPY WITH PROPOFOL;  Surgeon: Lucilla Lame, MD;  Location: [place];  Service: Gastroenterology;  Laterality: N/A;  diabetic   EYE SURGERY  Oct 2009   for ptosis,  Dr. Rosaria Ferries, eyelid lift   POLYPECTOMY  09/22/2017   Procedure: POLYPECTOMY;  Surgeon: Lucilla Lame, MD;  Location: [place];  Service: Gastroenterology;;   TUBAL LIGATION      There were no  vitals filed for this visit.   Subjective Assessment - 09/30/21 1731     Subjective Pt present to tx with referral for R shoulder pain. Pt is currently be seen with PT under a Balance referral. Future POC will continued with Balance and R shoulder Tx. Pt states onset of sx with R rotator cuff injury 08/08/2021 2/2 to fall.  MRI to confirm rotator cuff tear. Pt stated that she did not want to have surgery.  Pt received injection 09/16/21 with great benefit.    Pertinent History Pt is currently a high fall risk, fall risk increases when duel tasking is facilitated.    Limitations House hold activities;Standing;Walking    How long can you sit comfortably? As needed    How long can you stand comfortably? As needed    How long can you walk comfortably? less than 30 minute    Patient Stated Goals To walk without fall    Currently in Pain? No/denies    Pain Score 0-No pain    Pain Location Shoulder    Pain Orientation Right    Pain Descriptors / Indicators Aching;Sharp    Pain Type Chronic pain    Pain Onset More than a month ago  SUBJECTIVE Chief complaint:  Pt present to tx with referral for R shoulder pain. Pt is currently being seen with PT under a Balance referral. Future POC will continue with Balance and R shoulder Tx. Pt states onset of sx with R rotator cuff injury 08/08/2021 2/2 to fall.  MRI to confirm rotator cuff tear. Pt stated that she did not want to have surgery.  Pt received injection 09/16/21 with great benefit.  History: Pt is currently a high fall risk, fall risk increases when duel tasking is facilitated.  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  Pain: Present 0/10, Best 0/10, Worst 7/10: Pain quality: Ache and sharp with movement.  Radiating pain: none  Numbness/Tingling: none 24 hour pain behavior:  most bothersome at  night, currently not affecting sleep.  Aggravating factors: Reaching behind back, lifting dishes overhead.  Easing factors: Rest in normal position.  How long can you sit: WNL How long can you stand: WNL How long can you walk: WNL  History of back injury, pain, surgery, or therapy:  Follow-up appointment with MD:  Pt states onset of sx with R rotator cuff injury 08/08/2021 2/2 to fall Dominant hand: Right Imaging: xray 09/01/2021:  IMPRESSION: No acute abnormalities. 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 Able to reach behind back.    OBJECTIVE   Posture Slight forward posture with mild upper cross syndrome.   FOTO: 59 with predicted value of 64   Palpation -TtP L proximal bicep muscle belly and tendon. R posterior rotator cuff. [place] joint line.      Strength R/L 3+/4 Shoulder flexion  3+/4 Shoulder abduction  3+/4  Shoulder external rotation  4/4 Shoulder internal rotation  4/4+  Elbow flexion  3+/4  Elbow extension    AROM R/L 110/170 Shoulder flexion 119/170 Shoulder abduction 54/45 Shoulder external rotation 70/68 Shoulder internal rotation  *AROM =PROM*        Passive Accessory Motion (PAM) Pain and hypomobility in the R GH: direction posterior and inferior.      SPECIAL TESTS Deferred this date.      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 PT examination reveals the following  deficits: Posture: Slight forward posture with mild upper cross syndrome. FOTO: 59 with predicted value of 64. Palpation: TtP L proximal bicep muscle belly and tendon. R posterior rotator cuff. [place] joint line. MMT: R/L 3+/4 Shoulder flexion. 3+/4 Shoulder abduction. 3+/4  Shoulder external rotation. 4/4 Shoulder internal rotation. 4/4+  Elbow flexion. 3+/4  Elbow extension. AROM: R/L 110/170 Shoulder flexion. 119/170 Shoulder abduction 54/45 Shoulder external rotation. 70/68 Shoulder internal rotation. Pt continues to display mark LE/UE endurance, strength, and ROM deficits resulting in decreased safe home/community mobiity, increased pain, and limited access to QOL. Pt. will continue to benefit from skilled physical  therapy to progress POC to address remaining deficits to facilitate maximum functional capacity for optimal personal health and wellness for ADLs.            PT Education - 09/30/21 1733     Education Details Pt was educated on proper form and technique with Updated Shoulder HEP.    Person(s) Educated Patient    Methods Explanation;Demonstration;Tactile cues;Verbal cues;Handout    Comprehension Verbalized understanding;Returned demonstration                 PT Long Term Goals - 09/30/21 1739       PT LONG TERM GOAL #1   Title Pt will be independent with HEP in order to improve strength and balance in order to decrease fall risk and improve function at home.    Baseline IE: 8/3 no HEP 9/27: Pt as been provided a HEP but states poor adherence.    Time 5    Period Weeks    Status Not Met    Target Date 10/26/21      PT LONG TERM GOAL #2   Title Pt will improve FGA by at least 3 points in order to demonstrate clinically significant improvement in balance and decreased risk for falls.    Baseline IE: FGA: 25 9/27: 22    Time 5    Period Weeks    Status Not Met    Target Date 10/26/21      PT LONG TERM GOAL #3   Title Pt will decrease 5xSTS by at least 3 seconds  in order to demonstrate clinically significant improvement in LE strength.    Baseline IE: 8/2, 13 seconds 9/27: 13 seconds    Time 5    Period Weeks    Status Not Met    Target Date 10/26/21      PT LONG TERM GOAL #4   Title Pt will improve B LE MMT by 1/2 grade to facilitate stair clearance and ensure safety.    Baseline IE: Strength  R/L  3+/3+ Hip flexion  4/4 Hip abduction  4/4 Hip adduction  4/4Knee extension  4/4 Knee flexion 9/27: Strength  R/L  4-/4+- Hip flexion,  5/5 Hip abduction  5/5 Hip adduction  5/5 Knee extension  5/5 Knee flexion    Time 8    Period Weeks    Status Achieved    Target Date 09/21/21      PT LONG TERM GOAL #5   Title Pt. will increase FOTO to 56 to improve functional mobility/ safety.    Baseline Initial FOTO: 45 9/27: 72 (achieved 10th visit)    Time 8    Period Weeks    Status New    Target Date 09/21/21      Additional Long Term Goals   Additional Long Term Goals Yes      PT LONG TERM GOAL #6   Title Pt will improve FOTO score to predicted improvement value 64 to measure self reported adherence to functional abilites with ADLs.    Baseline 64    Time 4    Period Weeks    Status New    Target Date 10/26/21      PT LONG TERM GOAL #7   Title Pt will increase strength of by at least 1/2 MMT grade in order to demonstrate improvement in strength and function.    Baseline R/L  3+/4 Shoulder flexion   3+/4 Shoulder abduction   3+/4  Shoulder external rotation   4/4  Shoulder internal rotation  4/4+  Elbow flexion  3+/4  Elbow extension    Time 4    Period Weeks    Status New    Target Date 10/26/21      PT LONG TERM GOAL #8   Title Pt will improve RUE AROM equal to LUE to all increased tolerance with overhead tasks.    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 PT examination reveals the following deficits: Posture: Slight forward posture with mild upper cross syndrome. FOTO: 59 with predicted value of 64. Palpation: TtP L proximal bicep muscle belly and tendon. R posterior rotator cuff. [place] joint line. MMT: R/L 3+/4 Shoulder flexion. 3+/4 Shoulder abduction. 3+/4  Shoulder external rotation. 4/4 Shoulder internal rotation. 4/4+  Elbow flexion. 3+/4  Elbow extension. AROM: R/L  110/170 Shoulder flexion. 119/170 Shoulder abduction  54/45 Shoulder external rotation. 70/68 Shoulder internal rotation. Pt continues to display mark LE/UE endurance, strength, and ROM deficits resulting in decreased safe home/community mobiity, increased pain, and limited access to QOL. Pt. will continue to benefit from skilled physical therapy to progress POC to address remaining deficits to facilitate maximum functional capacity for optimal personal health and wellness for ADLs.    Personal Factors and Comorbidities Age;Time since onset of injury/illness/exacerbation;Past/Current Experience;Fitness    Examination-Activity Limitations Stairs;Reach Overhead    Examination-Participation Restrictions Interpersonal Relationship;Cleaning;Laundry    Stability/Clinical Decision Making Evolving/Moderate complexity    Clinical Decision Making Moderate    Rehab Potential Good    PT Frequency 2x / week    PT Duration 8 weeks   5 weeks   PT Treatment/Interventions Manual techniques;Passive range of motion;Therapeutic activities;Functional mobility training;Therapeutic exercise;Balance training;Neuromuscular re-education;Gait training;Stair training;Patient/family education;ADLs/Self Care Home Management;Dry needling;Joint Manipulations;Spinal Manipulations;Vestibular;Energy  conservation;Biofeedback;Electrical Stimulation;Moist Heat;Cryotherapy    PT Next Visit Plan Reassess adherence to shoulder HEP.    PT Home Exercise Plan Access Code: [email or web address]   Shoulder:  [email or web address]    Consulted and Agree with Plan of Care Patient             Patient will benefit from skilled therapeutic intervention in order to improve the following deficits and impairments:  Decreased balance, Decreased activity tolerance, Decreased endurance, Postural dysfunction, Decreased mobility, Decreased strength, Difficulty walking, Abnormal gait, Decreased coordination, Decreased range of motion, Decreased safety awareness, Hypomobility, Impaired perceived functional ability, Impaired flexibility, Impaired UE functional use, Improper body mechanics, Pain  Visit Diagnosis: Unsteadiness on feet  Muscle weakness (generalized)     Problem List 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   LVH (left ventricular hypertrophy) due to hypertensive disease, without heart failure 04/11/2019   Foot pain, right 10/10/2018   Ganglion cyst of left foot 08/29/2018  Xerosis of skin 08/29/2018   Palpitations 08/08/2018   Colon cancer screening    Trochanteric bursitis of left hip 12/07/2016   History of endometrial cancer 08/03/2016   Edema leg 04/13/2016   Type II diabetes mellitus with complication ([place]) 93/26/7124   Shoulder pain, left 02/12/2016   Acid reflux 10/02/2015   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   Hyperlipidemia associated with type 2 diabetes mellitus ([place]) 04/08/2015   Aortic heart valve narrowing 03/26/2015   Bilateral carotid artery stenosis 03/26/2015   Anxiety     Environmental and seasonal allergies    Postmenopausal atrophic vaginitis    Depression, major, recurrent, moderate ([place])    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 I have personally read, edited and approve of the note as written. Patrina Levering PT, DPT   [employer] [facility] [address] [place], [place], [address] Phone: [phone]   Fax:  [phone]  Name: Samantha Lang MRN: [id number] Date of Birth: 1941-06-19

## 2021-10-01 NOTE — Addendum Note (Signed)
Addended by: Ramonita Lab on: 10/01/2021 09:08 AM   Modules accepted: Orders

## 2021-10-05 ENCOUNTER — Encounter: Payer: Medicare HMO | Admitting: Physical Therapy

## 2021-10-07 ENCOUNTER — Encounter: Payer: Medicare HMO | Admitting: Physical Therapy

## 2021-10-08 ENCOUNTER — Other Ambulatory Visit: Payer: Self-pay | Admitting: Internal Medicine

## 2021-10-08 DIAGNOSIS — M10079 Idiopathic gout, unspecified ankle and foot: Secondary | ICD-10-CM

## 2021-10-08 NOTE — Telephone Encounter (Signed)
No uric acid. Requested Prescriptions  Pending Prescriptions Disp Refills  . allopurinol (ZYLOPRIM) 100 MG tablet [Pharmacy Med Name: ALLOPURINOL 100 MG TABLET] 90 tablet 1    Sig: TAKE 1 TABLET BY MOUTH EVERY DAY     Endocrinology:  Gout Agents Failed - 10/08/2021  8:17 AM      Failed - Uric Acid in normal range and within 360 days    Uric Acid  Date Value Ref Range Status  10/12/2018 7.0 2.5 - 7.1 mg/dL Final    Comment:               Therapeutic target for gout patients: <6.0         Passed - Cr in normal range and within 360 days    Creatinine, Ser  Date Value Ref Range Status  09/14/2021 0.80 0.57 - 1.00 mg/dL Final         Passed - Valid encounter within last 12 months    Recent Outpatient Visits          3 weeks ago Right rotator cuff tear arthropathy   [facility] Montel Culver, MD   3 weeks ago Annual physical exam   [facility] Glean Hess, MD   1 month ago Right rotator cuff tear arthropathy   [facility] Montel Culver, MD   3 months ago Frequent falls   [place], [place]   3 months ago Change in bowel habits   [facility] Glean Hess, MD      Future Appointments            In 2 weeks Zigmund Daniel, Earley Abide, MD [facility], [place]   In 3 months [place] Melia Jesse Sans, MD [facility], [facility]

## 2021-10-11 ENCOUNTER — Encounter: Payer: Self-pay | Admitting: Internal Medicine

## 2021-10-11 ENCOUNTER — Ambulatory Visit: Payer: Medicare HMO | Admitting: Internal Medicine

## 2021-10-12 ENCOUNTER — Encounter: Payer: Medicare HMO | Admitting: Physical Therapy

## 2021-10-14 ENCOUNTER — Encounter: Payer: Medicare HMO | Admitting: Physical Therapy

## 2021-10-19 ENCOUNTER — Encounter: Payer: Medicare HMO | Admitting: Physical Therapy

## 2021-10-21 ENCOUNTER — Encounter: Payer: Medicare HMO | Admitting: Physical Therapy

## 2021-10-22 ENCOUNTER — Ambulatory Visit: Payer: Self-pay | Admitting: *Deleted

## 2021-10-22 ENCOUNTER — Other Ambulatory Visit: Payer: Self-pay

## 2021-10-22 ENCOUNTER — Encounter: Payer: Self-pay | Admitting: Internal Medicine

## 2021-10-22 ENCOUNTER — Encounter: Payer: Medicare HMO | Admitting: Internal Medicine

## 2021-10-22 NOTE — Progress Notes (Signed)
Date:  10/22/2021   Name:  Samantha Lang   DOB:  1941/10/20   MRN:  [id number]   Lab Results  Component Value Date   CREATININE 0.80 09/14/2021   BUN 22 09/14/2021   NA 138 09/14/2021   K 3.5 09/14/2021   CL 96 09/14/2021   CO2 26 09/14/2021   Lab Results  Component Value Date   CHOL 159 07/09/2021   HDL 64 07/09/2021   LDLCALC 65 07/09/2021   LDLDIRECT 138.3 09/26/2011   TRIG 181 (H) 07/09/2021   CHOLHDL 2.5 07/09/2021   Lab Results  Component Value Date   TSH 3.190 07/09/2021   Lab Results  Component Value Date   HGBA1C 7.2 (H) 07/09/2021   Lab Results  Component Value Date   WBC 7.4 07/09/2021   HGB 13.3 07/09/2021   HCT 38.5 07/09/2021   MCV 90 07/09/2021   PLT 152 07/09/2021   Lab Results  Component Value Date   ALT 35 (H) 09/14/2021   AST 35 09/14/2021   ALKPHOS 84 09/14/2021   BILITOT 0.5 09/14/2021     Review of Systems  Respiratory:  Positive for cough.    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   LVH (left ventricular hypertrophy) due to hypertensive disease, without heart failure 04/11/2019   Foot pain, right 10/10/2018   Ganglion cyst of left foot 08/29/2018   Xerosis of skin 08/29/2018   Palpitations 08/08/2018   Colon cancer screening    Trochanteric bursitis of left hip 12/07/2016   History of endometrial cancer 08/03/2016   Edema leg 04/13/2016   Type II diabetes mellitus with complication ([place]) 53/66/4403   Shoulder pain, left 02/12/2016   Acid reflux 10/02/2015   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   Hyperlipidemia associated with type 2 diabetes mellitus ([place]) 04/08/2015   Aortic heart valve narrowing  03/26/2015   Bilateral carotid artery stenosis 03/26/2015   Anxiety    Environmental and seasonal allergies    Postmenopausal atrophic vaginitis    Depression, major, recurrent, moderate ([facility])    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 Rosaria Ferries, eyelid lift   POLYPECTOMY  09/22/2017   Procedure: POLYPECTOMY;  Surgeon: Lucilla Lame, MD;  Location: [place];  Service: Gastroenterology;;   TUBAL LIGATION      Social History   Tobacco Use   Smoking status: Former    Packs/day: 0.25    Years: 20.00    Pack years: 5.00  Types: Cigarettes    Quit date: 09/25/1988    Years since quitting: 33.0   Smokeless tobacco: Never  Vaping Use   Vaping Use: Never used  Substance Use Topics   Alcohol use: Yes    Alcohol/week: 6.0 standard drinks    Types: 2 Glasses of wine, 2 Cans of beer, 2 Shots of liquor per week   Drug use: Never     Medication list has been reviewed and updated.  No outpatient medications have been marked as taking for the 10/22/21 encounter (Appointment) with Glean Hess, MD.    [facility] 2/9 Scores 09/16/2021 09/14/2021 09/02/2021 07/09/2021  PHQ - 2 Score 4 3 4 4   PHQ- 9 Score 15 12 9 11     GAD 7 : Generalized Anxiety Score 09/16/2021 09/14/2021 09/02/2021 07/09/2021  Nervous, Anxious, on Edge 2 1 0 0  Control/stop worrying 2 1 0 0  Worry  too much - different things 2 1 0 0  Trouble relaxing 2 1 0 0  Restless 2 1 0 0  Easily annoyed or irritable 2 1 0 0  Afraid - awful might happen 2 1 0 0  Total GAD 7 Score 14 7 0 0  Anxiety Difficulty Very difficult - Not difficult at all -    BP Readings from Last 3 Encounters:  09/16/21 108/64  09/14/21 138/62  09/02/21 124/84    Physical Exam  Wt Readings from Last 3 Encounters:  09/16/21 167 lb (75.8 kg)  09/14/21 168 lb (76.2 kg)  09/02/21 168 lb (76.2 kg)    There were no vitals taken for this visit.  Assessment and Plan:

## 2021-10-22 NOTE — Telephone Encounter (Signed)
Pt called stating that she tested positive for covid yesterday 10/21/21 and that she has a lingering cough. She is concerned due to company this weekend and is requesting to have a call back. Please advise.   Call to patient- patient test + COVID yesterday with symptoms that started Sunday. Patient main symptom is cough- not very productive at this time. Patient advised per COVID protocol- treatment, isolation, and discussed use of antiviral and although she is moderate risk- she is past the 5 day symptom window. Patient advised on virtual care option over the week end for any changes. Reason for Disposition  [1] HIGH RISK for severe COVID complications (e.g., weak immune system, age > 46 years, obesity with BMI > 25, pregnant, chronic lung disease or other chronic medical condition) AND [2] COVID symptoms (e.g., cough, fever)  (Exceptions: Already seen by PCP and no new or worsening symptoms.)  Answer Assessment - Initial Assessment Questions 1. COVID-19 DIAGNOSIS: "Who made your COVID-19 diagnosis?" "Was it confirmed by a positive lab test or self-test?" If not diagnosed by a doctor (or NP/PA), ask "Are there lots of cases (community spread) where you live?" Note: See public health department website, if unsure.     + COVID- home test 2. COVID-19 EXPOSURE: "Was there any known exposure to COVID before the symptoms began?" CDC Definition of close contact: within 6 feet (2 meters) for a total of 15 minutes or more over a 24-hour period.      1 week ago- husband felt bad- diagnosed with COVID 3. ONSET: "When did the COVID-19 symptoms start?"      Sunday 4. WORST SYMPTOM: "What is your worst symptom?" (e.g., cough, fever, shortness of breath, muscle aches)     Cough-deep- not productive 5. COUGH: "Do you have a cough?" If Yes, ask: "How bad is the cough?"       Cough- Not productive 6. FEVER: "Do you have a fever?" If Yes, ask: "What is your temperature, how was it measured, and when did it start?"      no 7. RESPIRATORY STATUS: "Describe your breathing?" (e.g., shortness of breath, wheezing, unable to speak)      no 8. BETTER-SAME-WORSE: "Are you getting better, staying the same or getting worse compared to yesterday?"  If getting worse, ask, "In what way?"     Better 9. HIGH RISK DISEASE: "Do you have any chronic medical problems?" (e.g., asthma, heart or lung disease, weak immune system, obesity, etc.)     Age, diabetic 10. VACCINE: "Have you had the COVID-19 vaccine?" If Yes, ask: "Which one, how many shots, when did you get it?"       no 11. BOOSTER: "Have you received your COVID-19 booster?" If Yes, ask: "Which one and when did you get it?"       no 12. PREGNANCY: "Is there any chance you are pregnant?" "When was your last menstrual period?"       na 13. OTHER SYMPTOMS: "Do you have any other symptoms?"  (e.g., chills, fatigue, headache, loss of smell or taste, muscle pain, sore throat)       no 14. O2 SATURATION MONITOR:  "Do you use an oxygen saturation monitor (pulse oximeter) at home?" If Yes, ask "What is your reading (oxygen level) today?" "What is your usual oxygen saturation reading?" (e.g., 95%)       no  Protocols used: Coronavirus (COVID-19) Diagnosed or Suspected-A-AH

## 2021-10-28 ENCOUNTER — Ambulatory Visit: Payer: Medicare HMO | Admitting: Family Medicine

## 2021-11-04 ENCOUNTER — Encounter: Payer: Self-pay | Admitting: Internal Medicine

## 2021-11-04 ENCOUNTER — Other Ambulatory Visit: Payer: Self-pay | Admitting: Internal Medicine

## 2021-11-04 DIAGNOSIS — R918 Other nonspecific abnormal finding of lung field: Secondary | ICD-10-CM

## 2021-11-11 ENCOUNTER — Ambulatory Visit
Admission: RE | Admit: 2021-11-11 | Discharge: 2021-11-11 | Disposition: A | Discharge status: Home / Self Care | Payer: Medicare HMO | Source: Ambulatory Visit | Attending: Internal Medicine | Admitting: Internal Medicine

## 2021-11-11 ENCOUNTER — Other Ambulatory Visit: Payer: Self-pay

## 2021-11-11 DIAGNOSIS — R918 Other nonspecific abnormal finding of lung field: Secondary | ICD-10-CM | POA: Diagnosis not present

## 2021-11-11 DIAGNOSIS — R911 Solitary pulmonary nodule: Secondary | ICD-10-CM | POA: Diagnosis not present

## 2021-11-11 DIAGNOSIS — I700 Atherosclerosis of aorta: Secondary | ICD-10-CM | POA: Diagnosis not present

## 2021-11-24 ENCOUNTER — Other Ambulatory Visit: Payer: Self-pay | Admitting: Internal Medicine

## 2021-11-24 DIAGNOSIS — M10079 Idiopathic gout, unspecified ankle and foot: Secondary | ICD-10-CM

## 2021-11-25 NOTE — Telephone Encounter (Signed)
Requested Prescriptions  Pending Prescriptions Disp Refills  . allopurinol (ZYLOPRIM) 100 MG tablet [Pharmacy Med Name: ALLOPURINOL 100 MG TABLET] 90 tablet 1    Sig: TAKE 1 TABLET BY MOUTH EVERY DAY     Endocrinology:  Gout Agents Failed - 11/24/2021  7:17 AM      Failed - Uric Acid in normal range and within 360 days    Uric Acid  Date Value Ref Range Status  10/12/2018 7.0 2.5 - 7.1 mg/dL Final    Comment:               Therapeutic target for gout patients: <6.0         Passed - Cr in normal range and within 360 days    Creatinine, Ser  Date Value Ref Range Status  09/14/2021 0.80 0.57 - 1.00 mg/dL Final         Passed - Valid encounter within last 12 months    Recent Outpatient Visits          2 months ago Right rotator cuff tear arthropathy   [facility] Montel Culver, MD   2 months ago Annual physical exam   [facility] Glean Hess, MD   2 months ago Right rotator cuff tear arthropathy   [facility] Montel Culver, MD   4 months ago Frequent falls   [place], MD   5 months ago Change in bowel habits   [facility] Glean Hess, MD      Future Appointments            In 1 month Army Melia Jesse Sans, MD [facility], [place]           . pantoprazole (PROTONIX) 40 MG tablet [Pharmacy Med Name: PANTOPRAZOLE SOD DR 40 MG TAB] 90 tablet 1    Sig: TAKE 1 TABLET BY MOUTH EVERY DAY     Gastroenterology: Proton Pump Inhibitors Passed - 11/24/2021  7:17 AM      Passed - Valid encounter within last 12 months    Recent Outpatient Visits          2 months ago Right rotator cuff tear arthropathy   [facility] Montel Culver, MD   2 months ago Annual physical exam   [facility] Glean Hess, MD   2 months ago Right rotator cuff tear arthropathy   [facility] Montel Culver, MD   4 months ago Frequent falls   [facility] Glean Hess, MD   5 months ago Change in bowel habits   [facility] Glean Hess, MD      Future Appointments            In 1 month Army Melia Jesse Sans, MD [facility], [place]           . ONETOUCH ULTRA test strip [employer] Med Name: [place] TEST STRP] 100 strip     Sig: TEST TWICE DAILY     Endocrinology: Diabetes - Testing Supplies Passed - 11/24/2021  7:17 AM      Passed - Valid encounter within last 12 months    Recent Outpatient Visits          2 months ago Right rotator cuff tear arthropathy   [facility] Montel Culver, MD   2 months ago Annual physical exam   [facility] Glean Hess,  MD   2 months ago Right rotator cuff tear arthropathy   [facility] Montel Culver, MD   4 months ago Frequent falls   [place], MD   5 months ago Change in bowel habits   [facility] Glean Hess, MD      Future Appointments            In 1 month Army Melia, Jesse Sans, MD [facility], [facility]

## 2021-11-25 NOTE — Telephone Encounter (Signed)
Requested medication (s) are due for refill today: yes  Requested medication (s) are on the active medication list: yes  Last refill:  08/30/21  Future visit scheduled: 01/14/22  Notes to clinic:  test strips not on current med list, Zyloprim failed protocol of uric acid within 360 days, please assesss.      Requested Prescriptions  Pending Prescriptions Disp Refills   allopurinol (ZYLOPRIM) 100 MG tablet [Pharmacy Med Name: ALLOPURINOL 100 MG TABLET] 90 tablet 1    Sig: TAKE 1 TABLET BY MOUTH EVERY DAY     Endocrinology:  Gout Agents Failed - 11/24/2021  7:17 AM      Failed - Uric Acid in normal range and within 360 days    Uric Acid  Date Value Ref Range Status  10/12/2018 7.0 2.5 - 7.1 mg/dL Final    Comment:               Therapeutic target for gout patients: <6.0          Passed - Cr in normal range and within 360 days    Creatinine, Ser  Date Value Ref Range Status  09/14/2021 0.80 0.57 - 1.00 mg/dL Final          Passed - Valid encounter within last 12 months    Recent Outpatient Visits           2 months ago Right rotator cuff tear arthropathy   [facility] Montel Culver, MD   2 months ago Annual physical exam   [facility] Glean Hess, MD   2 months ago Right rotator cuff tear arthropathy   [facility] Montel Culver, MD   4 months ago Frequent falls   [facility] Glean Hess, MD   5 months ago Change in bowel habits   [facility] Glean Hess, MD       Future Appointments             In 1 month Army Melia, Jesse Sans, MD [facility] [place] test strip [place] Med Name: [place] TEST STRP] 100 strip     Sig: TEST TWICE DAILY     Endocrinology: Diabetes - Testing Supplies Passed - 11/24/2021  7:17 AM      Passed - Valid encounter within last 12 months    Recent Outpatient Visits           2 months ago Right rotator cuff tear  arthropathy   [facility] Montel Culver, MD   2 months ago Annual physical exam   [facility] Glean Hess, MD   2 months ago Right rotator cuff tear arthropathy   [facility] Montel Culver, MD   4 months ago Frequent falls   [facility] Glean Hess, MD   5 months ago Change in bowel habits   [facility] Glean Hess, MD       Future Appointments             In 1 month Army Melia Jesse Sans, MD [facility]            Signed Prescriptions Disp Refills   pantoprazole (PROTONIX) 40 MG tablet 90 tablet 1    Sig: TAKE 1 TABLET BY MOUTH EVERY DAY     Gastroenterology: Proton Pump Inhibitors Passed - 11/24/2021  7:23 AM      Passed - Valid encounter within last 12 months    Recent Outpatient Visits           2 months ago Right rotator cuff tear arthropathy   [facility] Montel Culver, MD   2 months ago Annual physical exam   [facility] Glean Hess, MD   2 months ago Right rotator cuff tear arthropathy   [facility] Montel Culver, MD   4 months ago Frequent falls   [place], MD   5 months ago Change in bowel habits   [facility] Glean Hess, MD       Future Appointments             In 1 month Army Melia Jesse Sans, MD [facility], [facility]

## 2021-12-04 ENCOUNTER — Other Ambulatory Visit: Payer: Self-pay | Admitting: Internal Medicine

## 2021-12-04 DIAGNOSIS — E119 Type 2 diabetes mellitus without complications: Secondary | ICD-10-CM

## 2021-12-04 NOTE — Telephone Encounter (Signed)
Requested Prescriptions  Pending Prescriptions Disp Refills  . JANUVIA 100 MG tablet [Pharmacy Med Name: JANUVIA 100 MG TABLET] 90 tablet 0    Sig: TAKE 1 TABLET BY MOUTH EVERY DAY     Endocrinology:  Diabetes - DPP-4 Inhibitors Passed - 12/04/2021  8:33 AM      Passed - HBA1C is between 0 and 7.9 and within 180 days    Hgb A1c MFr Bld  Date Value Ref Range Status  07/09/2021 7.2 (H) 4.8 - 5.6 % Final    Comment:             Prediabetes: 5.7 - 6.4          Diabetes: >6.4          Glycemic control for adults with diabetes: <7.0          Passed - Cr in normal range and within 360 days    Creatinine, Ser  Date Value Ref Range Status  09/14/2021 0.80 0.57 - 1.00 mg/dL Final         Passed - Valid encounter within last 6 months    Recent Outpatient Visits          2 months ago Right rotator cuff tear arthropathy   [facility] Montel Culver, MD   2 months ago Annual physical exam   [facility] Glean Hess, MD   3 months ago Right rotator cuff tear arthropathy   [facility] Montel Culver, MD   4 months ago Frequent falls   [place], MD   5 months ago Change in bowel habits   [facility] Glean Hess, MD      Future Appointments            In 1 month Army Melia, Jesse Sans, MD [facility], [facility]

## 2021-12-22 ENCOUNTER — Other Ambulatory Visit: Payer: Self-pay | Admitting: Internal Medicine

## 2021-12-22 DIAGNOSIS — F331 Major depressive disorder, recurrent, moderate: Secondary | ICD-10-CM

## 2021-12-22 NOTE — Telephone Encounter (Signed)
Requested Prescriptions  Pending Prescriptions Disp Refills   venlafaxine XR (EFFEXOR-XR) 150 MG 24 hr capsule [Pharmacy Med Name: VENLAFAXINE HCL ER 150 MG CAP] 90 capsule 0    Sig: TAKE (1) CAPSULE BY MOUTH EVERY DAY     Psychiatry: Antidepressants - SNRI - desvenlafaxine & venlafaxine Failed - 12/22/2021 10:13 AM      Failed - Triglycerides in normal range and within 360 days    Triglycerides  Date Value Ref Range Status  07/09/2021 181 (H) 0 - 149 mg/dL Final         Passed - LDL in normal range and within 360 days    LDL Chol Calc (NIH)  Date Value Ref Range Status  07/09/2021 65 0 - 99 mg/dL Final   Direct LDL  Date Value Ref Range Status  09/26/2011 138.3 mg/dL Final    Comment:    Optimal:  <100 mg/dLNear or Above Optimal:  100-129 mg/dLBorderline High:  130-159 mg/dLHigh:  160-189 mg/dLVery High:  >190 mg/dL         Passed - Total Cholesterol in normal range and within 360 days    Cholesterol, Total  Date Value Ref Range Status  07/09/2021 159 100 - 199 mg/dL Final         Passed - Completed PHQ-2 or PHQ-9 in the last 360 days      Passed - Last BP in normal range    BP Readings from Last 1 Encounters:  09/16/21 108/64         Passed - Valid encounter within last 6 months    Recent Outpatient Visits          3 months ago Right rotator cuff tear arthropathy   [facility] Montel Culver, MD   3 months ago Annual physical exam   [facility] Glean Hess, MD   3 months ago Right rotator cuff tear arthropathy   [facility] Montel Culver, MD   5 months ago Frequent falls   [facility] Glean Hess, MD   6 months ago Change in bowel habits   [facility] Glean Hess, MD      Future Appointments            In 3 weeks Army Melia Jesse Sans, MD [facility], [facility]

## 2022-01-14 ENCOUNTER — Encounter: Payer: Self-pay | Admitting: Internal Medicine

## 2022-01-14 ENCOUNTER — Ambulatory Visit (INDEPENDENT_AMBULATORY_CARE_PROVIDER_SITE_OTHER): Payer: Medicare HMO | Admitting: Internal Medicine

## 2022-01-14 ENCOUNTER — Other Ambulatory Visit: Payer: Self-pay

## 2022-01-14 VITALS — BP 138/82 | HR 70 | Ht 62.0 in | Wt 168.8 lb

## 2022-01-14 DIAGNOSIS — E118 Type 2 diabetes mellitus with unspecified complications: Secondary | ICD-10-CM | POA: Diagnosis not present

## 2022-01-14 DIAGNOSIS — Z1231 Encounter for screening mammogram for malignant neoplasm of breast: Secondary | ICD-10-CM | POA: Diagnosis not present

## 2022-01-14 DIAGNOSIS — R69 Illness, unspecified: Secondary | ICD-10-CM | POA: Diagnosis not present

## 2022-01-14 DIAGNOSIS — F331 Major depressive disorder, recurrent, moderate: Secondary | ICD-10-CM

## 2022-01-14 DIAGNOSIS — I10 Essential (primary) hypertension: Secondary | ICD-10-CM | POA: Diagnosis not present

## 2022-01-14 LAB — POCT GLYCOSYLATED HEMOGLOBIN (HGB A1C): Hemoglobin A1C: 7 % — AB (ref 4.0–5.6)

## 2022-01-14 NOTE — Progress Notes (Signed)
Date:  01/14/2022   Name:  Samantha Lang   DOB:  1941-03-11   MRN:  [id number]   Chief Complaint: Hypertension and Diabetes  Diabetes She presents for her follow-up diabetic visit. She has type 2 diabetes mellitus. Her disease course has been stable. Hypoglycemia symptoms include nervousness/anxiousness. Pertinent negatives for hypoglycemia include no dizziness or headaches. Pertinent negatives for diabetes include no chest pain and no fatigue. Symptoms are stable. Pertinent negatives for diabetic complications include no CVA. Current diabetic treatment includes oral agent (monotherapy). She is compliant with treatment all of the time. She is following a generally healthy diet. She rarely participates in exercise. An ACE inhibitor/angiotensin II receptor blocker is being taken. Eye exam is not current.  Hypertension This is a chronic problem. The problem is controlled. Pertinent negatives include no chest pain, headaches, palpitations or shortness of breath. Past treatments include angiotensin blockers, diuretics, calcium channel blockers and beta blockers. There is no history of kidney disease, CAD/MI or CVA.  Depression        This is a chronic problem.  The problem occurs daily.  The problem has been gradually improving since onset.  Associated symptoms include no decreased concentration, no fatigue and no headaches.  Past treatments include SNRIs - Serotonin and norepinephrine reuptake inhibitors.  Compliance with treatment is good.  Previous treatment provided moderate relief.  Lab Results  Component Value Date   NA 138 09/14/2021   K 3.5 09/14/2021   CO2 26 09/14/2021   GLUCOSE 186 (H) 09/14/2021   BUN 22 09/14/2021   CREATININE 0.80 09/14/2021   CALCIUM 9.6 09/14/2021   EGFR 75 09/14/2021   GFRNONAA 54 (L) 11/23/2020   Lab Results  Component Value Date   CHOL 159 07/09/2021   HDL 64 07/09/2021   LDLCALC 65 07/09/2021   LDLDIRECT 138.3 09/26/2011   TRIG 181 (H) 07/09/2021    CHOLHDL 2.5 07/09/2021   Lab Results  Component Value Date   TSH 3.190 07/09/2021   Lab Results  Component Value Date   HGBA1C 7.2 (H) 07/09/2021   Lab Results  Component Value Date   WBC 7.4 07/09/2021   HGB 13.3 07/09/2021   HCT 38.5 07/09/2021   MCV 90 07/09/2021   PLT 152 07/09/2021   Lab Results  Component Value Date   ALT 35 (H) 09/14/2021   AST 35 09/14/2021   ALKPHOS 84 09/14/2021   BILITOT 0.5 09/14/2021   No results found for: 25OHVITD2, 25OHVITD3, VD25OH   Review of Systems  Constitutional:  Negative for fatigue and fever.  Respiratory:  Negative for cough, chest tightness and shortness of breath.   Cardiovascular:  Negative for chest pain and palpitations.  Gastrointestinal:  Negative for abdominal pain, constipation and diarrhea.  Neurological:  Negative for dizziness, light-headedness and headaches.  Psychiatric/Behavioral:  Positive for depression and dysphoric mood. Negative for decreased concentration and sleep disturbance. The patient is nervous/anxious.    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   LVH (left ventricular hypertrophy) due to hypertensive disease, without heart failure 04/11/2019   Foot pain, right 10/10/2018   Ganglion cyst of left foot 08/29/2018   Xerosis of skin 08/29/2018   Palpitations 08/08/2018   Colon  cancer screening    Trochanteric bursitis of left hip 12/07/2016   History of endometrial cancer 08/03/2016   Edema leg 04/13/2016   Type II diabetes mellitus with complication ([place]) 20/09/711   Shoulder pain, left 02/12/2016   Acid reflux 10/02/2015   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   Hyperlipidemia  associated with type 2 diabetes mellitus ([place]) 04/08/2015   Aortic heart valve narrowing 03/26/2015   Bilateral carotid artery stenosis 03/26/2015   Anxiety    Environmental and seasonal allergies    Postmenopausal atrophic vaginitis    Depression, major, recurrent, moderate (HCC)    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 Rosaria Ferries, eyelid lift   POLYPECTOMY  09/22/2017   Procedure: POLYPECTOMY;  Surgeon: Lucilla Lame, MD;  Location: [facility];  Service: Gastroenterology;;   TUBAL LIGATION      Social History   Tobacco Use   Smoking status: Former    Packs/day: 0.25    Years: 20.00    Pack years: 5.00    Types: Cigarettes    Quit date: 09/25/1988    Years since quitting: 33.3   Smokeless tobacco: Never  Vaping Use   Vaping Use: Never used  Substance Use Topics   Alcohol use: Yes    Alcohol/week: 6.0 standard drinks    Types: 2 Glasses of wine, 2 Cans of beer, 2 Shots of liquor per week   Drug use: Never     Medication list has been reviewed and updated.  Current Meds  Medication Sig   allopurinol (ZYLOPRIM) 100 MG tablet TAKE 1 TABLET BY MOUTH EVERY DAY   amLODipine (NORVASC) 5 MG tablet TAKE 1 TABLET BY MOUTH TWICE A DAY   aspirin 81 MG tablet Take 81 mg by mouth daily.   atorvastatin (LIPITOR) 10 MG tablet Take 1 tablet by mouth daily.    carvedilol (COREG) 6.25 MG tablet Take 6.25 mg by mouth 2 (two) times daily.   colchicine 0.6 MG tablet Take 0.6 mg by mouth daily. PRN only, takes rarely   diclofenac (VOLTAREN) 50 MG EC tablet Take 1 tablet (50 mg total) by mouth 2 (two) times daily as needed.   JANUVIA 100 MG tablet TAKE 1 TABLET BY MOUTH EVERY DAY   Multiple Vitamin (MULTIVITAMIN) capsule Take 1 capsule by mouth daily.   ONETOUCH ULTRA test strip TEST TWICE DAILY   pantoprazole (PROTONIX) 40 MG tablet TAKE 1 TABLET BY MOUTH EVERY DAY   psyllium (METAMUCIL SMOOTH TEXTURE) 58.6 % powder Please use one does every other day.   telmisartan-hydrochlorothiazide (MICARDIS HCT) 80-25 MG tablet Take 1 tablet by mouth daily.   venlafaxine XR (EFFEXOR-XR) 150 MG 24 hr capsule TAKE (1) CAPSULE BY MOUTH EVERY DAY   VITAMIN D PO Take 5,000 Units by mouth  daily.    PHQ 2/9 Scores 01/14/2022 09/16/2021 09/14/2021 09/02/2021  PHQ - 2 Score _0 PHQ- 9 Score _1 GAD 7 : Generalized Anxiety Score 01/14/2022 09/16/2021 09/14/2021 09/02/2021  Nervous, Anxious, on Edge _2 0  Control/stop worrying 0 2 1 0  Worry too much - different things 0 2 1 0  Trouble relaxing 0 2 1 0  Restless 0 2 1 0  Easily annoyed or irritable 0 2 1 0  Afraid - awful might happen _3 0  Total GAD 7 Score _4 0  Anxiety Difficulty Somewhat difficult Very difficult - Not difficult at all    BP Readings from Last 3 Encounters:  01/14/22 138/82  09/16/21 108/64  09/14/21 138/62    Physical Exam Vitals and nursing note reviewed.  Constitutional:      General: She is not in acute distress.    Appearance: Normal appearance. She is well-developed.  HENT:     Head: Normocephalic and atraumatic.  Cardiovascular:     Rate and Rhythm: Normal rate and regular rhythm.     Pulses: Normal pulses.     Heart sounds: No murmur heard. Pulmonary:     Effort: Pulmonary effort is normal. No respiratory distress.     Breath sounds: No wheezing or rhonchi.   Musculoskeletal:     Cervical back: Normal range of motion.     Right lower leg: No edema.     Left lower leg: No edema.  Skin:    General: Skin is warm and dry.     Findings: No rash.  Neurological:     Mental Status: She is alert and oriented to person, place, and time.  Psychiatric:        Mood and Affect: Mood normal.        Behavior: Behavior normal.    Wt Readings from Last 3 Encounters:  01/14/22 168 lb 12.8 oz (76.6 kg)  09/16/21 167 lb (75.8 kg)  09/14/21 168 lb (76.2 kg)    BP 138/82    Pulse 70    Ht _5  (1.575 m)    Wt 168 lb 12.8 oz (76.6 kg)    SpO2 99%    BMI 30.87 kg/m   Assessment and Plan: 1. Type II diabetes mellitus with complication (HCC) Clinically stable by exam and report without s/s of hypoglycemia. DM complicated by hypertension and dyslipidemia. Tolerating medications well without side effects or other concerns. - POCT glycosylated hemoglobin (Hb A1C) = 7.0 down from 7.2 - Microalbumin / creatinine urine ratio  2. Essential hypertension Clinically stable exam with well controlled BP. Tolerating medications without side effects at this time. Pt to continue current regimen and low sodium diet; benefits of regular exercise as able discussed.  3. Depression, major, recurrent, moderate (HCC) Clinically stable on current regimen with good control of symptoms, No SI or HI. Will continue current therapy.  4. Encounter for screening mammogram for breast cancer Due for mammogram in May - MM 3D SCREEN BREAST BILATERAL   Partially dictated using Editor, commissioning. Any errors are unintentional.  Halina Maidens, MD [facility]  01/14/2022

## 2022-01-16 ENCOUNTER — Other Ambulatory Visit: Payer: Self-pay | Admitting: Internal Medicine

## 2022-01-16 ENCOUNTER — Encounter: Payer: Self-pay | Admitting: Internal Medicine

## 2022-01-16 LAB — MICROALBUMIN / CREATININE URINE RATIO
Creatinine, Urine: 48.5 mg/dL
Microalb/Creat Ratio: 438 mg/g creat — ABNORMAL HIGH (ref 0–29)
Microalbumin, Urine: 212.6 ug/mL

## 2022-01-17 ENCOUNTER — Ambulatory Visit (INDEPENDENT_AMBULATORY_CARE_PROVIDER_SITE_OTHER): Payer: Medicare HMO

## 2022-01-17 ENCOUNTER — Other Ambulatory Visit: Payer: Self-pay | Admitting: Internal Medicine

## 2022-01-17 DIAGNOSIS — Z0000 Encounter for general adult medical examination without abnormal findings: Secondary | ICD-10-CM | POA: Diagnosis not present

## 2022-01-17 NOTE — Progress Notes (Signed)
Subjective:   Samantha Lang is a 81 y.o. female who presents for Medicare Annual (Subsequent) preventive examination.  Virtual Visit via Telephone Note  I connected with  Samantha Lang on 01/17/22 at 11:20 AM EST by telephone and verified that I am speaking with the correct person using two identifiers.  Location: Patient: home Provider: [facility] Persons participating in the virtual visit: [place]   I discussed the limitations, risks, security and privacy concerns of performing an evaluation and management service by telephone and the availability of in person appointments. The patient expressed understanding and agreed to proceed.  Interactive audio and video telecommunications were attempted between this nurse and patient, however failed, due to patient having technical difficulties OR patient did not have access to video capability.  We continued and completed visit with audio only.  Some vital signs may be absent or patient reported.   Clemetine Marker, LPN   Review of Systems     Cardiac Risk Factors include: advanced age (>80men, >48 women);diabetes mellitus;dyslipidemia;hypertension;obesity (BMI >30kg/m2)     Objective:    There were no vitals filed for this visit. There is no height or weight on file to calculate BMI.  Advanced Directives 01/17/2022 01/13/2021 11/02/2020 10/28/2020 01/13/2020 07/20/2019 01/09/2019  Does Patient Have a Medical Advance Directive? No Yes No No No No No  Type of Advance Directive - Healthcare Power of Gage;Living will - - - - -  Copy of [place] in Chart? - No - copy requested - - - - -  Would patient like information on creating a medical advance directive? No - Patient declined - - - Yes (MAU/Ambulatory/Procedural Areas - Information given) - Yes (MAU/Ambulatory/Procedural Areas - Information given)    Current Medications (verified) Outpatient Encounter Medications as of 01/17/2022  Medication Sig    allopurinol (ZYLOPRIM) 100 MG tablet TAKE 1 TABLET BY MOUTH EVERY DAY   amLODipine (NORVASC) 5 MG tablet TAKE 1 TABLET BY MOUTH TWICE A DAY   aspirin 81 MG tablet Take 81 mg by mouth daily.   atorvastatin (LIPITOR) 10 MG tablet Take 1 tablet by mouth daily.   carvedilol (COREG) 6.25 MG tablet Take 6.25 mg by mouth 2 (two) times daily.   colchicine 0.6 MG tablet Take 0.6 mg by mouth daily. PRN only, takes rarely   diclofenac (VOLTAREN) 50 MG EC tablet Take 1 tablet (50 mg total) by mouth 2 (two) times daily as needed.   JANUVIA 100 MG tablet TAKE 1 TABLET BY MOUTH EVERY DAY   Multiple Vitamin (MULTIVITAMIN) capsule Take 1 capsule by mouth daily.   ONETOUCH ULTRA test strip TEST TWICE DAILY   pantoprazole (PROTONIX) 40 MG tablet TAKE 1 TABLET BY MOUTH EVERY DAY   psyllium (METAMUCIL SMOOTH TEXTURE) 58.6 % powder Please use one does every other day.   telmisartan-hydrochlorothiazide (MICARDIS HCT) 80-25 MG tablet Take 1 tablet by mouth daily.   venlafaxine XR (EFFEXOR-XR) 150 MG 24 hr capsule TAKE (1) CAPSULE BY MOUTH EVERY DAY   VITAMIN D PO Take 5,000 Units by mouth daily.   No facility-administered encounter medications on file as of 01/17/2022.    Allergies (verified) Atorvastatin, Cephalexin, Clarithromycin, Lipitor [atorvastatin calcium], Cephalexin, and Clarithromycin   History: Past Medical History:  Diagnosis Date   Allergy    Anxiety    Aortic valve stenosis, mild    Depression    mild   Diabetes mellitus age 71   GERD (gastroesophageal reflux disease)    Heart  murmur    Hemorrhoids    Hyperlipidemia    Hypertension 2003   Incontinence    Female stress   Mitral incompetence    Personal history of chemotherapy    3 treatments   Personal history of radiation therapy    5 treatments   Postmenopausal atrophic vaginitis    Torn rotator cuff    Uterine cancer [facility])    treated   Past Surgical History:  Procedure Laterality Date   ABDOMINAL HYSTERECTOMY  07/2016    BREAST BIOPSY Right 03/21/08   right, benign    COLONOSCOPY WITH PROPOFOL N/A 09/22/2017   Procedure: COLONOSCOPY WITH PROPOFOL;  Surgeon: Lucilla Lame, MD;  Location: [place];  Service: Gastroenterology;  Laterality: N/A;  diabetic   EYE SURGERY  Oct 2009   for ptosis,  Dr. Rosaria Ferries, eyelid lift   POLYPECTOMY  09/22/2017   Procedure: POLYPECTOMY;  Surgeon: Lucilla Lame, MD;  Location: [place];  Service: Gastroenterology;;   TUBAL LIGATION     Family History  Problem Relation Age of Onset   Breast cancer Paternal Aunt    Breast cancer Paternal Grandmother 6   Cancer Father 44       lung   Stroke Mother    Hypertension Mother    Cancer Brother        prostate   Depression Brother    Ovarian cancer Neg Hx    Colon cancer Neg Hx    Diabetes Neg Hx    Social History   Socioeconomic History   Marital status: Married    Spouse name: Sue Lush   Number of children: 2   Years of education: 61   Highest education level: Master's degree (e.g., MA, MS, MEng, MEd, MSW, MBA)  Occupational History   Occupation: Retired    Comment: Engineer, mining  Tobacco Use   Smoking status: Former    Packs/day: 0.25    Years: 20.00    Pack years: 5.00    Types: Cigarettes    Quit date: 09/25/1988    Years since quitting: 33.3   Smokeless tobacco: Never  Vaping Use   Vaping Use: Never used  Substance and Sexual Activity   Alcohol use: Yes    Alcohol/week: 6.0 standard drinks    Types: 2 Glasses of wine, 2 Cans of beer, 2 Shots of liquor per week   Drug use: Never   Sexual activity: Yes    Partners: Male    Birth control/protection: Surgical  Other Topics Concern   Not on file  Social History Narrative   Not on file   Social Determinants of Health   Financial Resource Strain: Low Risk    Difficulty of Paying Living Expenses: Not hard at all  Food Insecurity: No Food Insecurity   Worried About Charity fundraiser in the Last Year: Never true   [place]  in the Last Year: Never true  Transportation Needs: No Transportation Needs   Lack of Transportation (Medical): No   Lack of Transportation (Non-Medical): No  Physical Activity: Inactive   Days of Exercise per Week: 0 days   Minutes of Exercise per Session: 0 min  Stress: No Stress Concern Present   Feeling of Stress : Not at all  Social Connections: Moderately Isolated   Frequency of Communication with Friends and Family: More than three times a week   Frequency of Social Gatherings with Friends and Family: Twice a week   Attends Religious Services: Never  Active Member of Clubs or Organizations: No   Attends Archivist Meetings: Never   Marital Status: Married    Tobacco Counseling Counseling given: Not Answered   Clinical Intake:  Pre-visit preparation completed: Yes  Pain : No/denies pain     Nutritional Risks: None Diabetes: Yes CBG done?: No Did pt. bring in CBG monitor from home?: No  How often do you need to have someone help you when you read instructions, pamphlets, or other written materials from your doctor or pharmacy?: 1 - Never  Nutrition Risk Assessment:  Has the patient had any N/V/D within the last 2 months?  No  Does the patient have any non-healing wounds?  No  Has the patient had any unintentional weight loss or weight gain?  No   Diabetes:  Is the patient diabetic?  Yes  If diabetic, was a CBG obtained today?  No  Did the patient bring in their glucometer from home?  No  How often do you monitor your CBG's? Pt does not actively check blood sugar.   Financial Strains and Diabetes Management:  Are you having any financial strains with the device, your supplies or your medication? No .  Does the patient want to be seen by Chronic Care Management for management of their diabetes?  No  Would the patient like to be referred to a Nutritionist or for Diabetic Management?  No   Diabetic Exams:  Diabetic Eye Exam: Completed 07/14/20  negative retinopathy. Overdue for diabetic eye exam. Pt has been advised about the importance in completing this exam.   Diabetic Foot Exam: Completed 09/14/21.   Interpreter Needed?: No  Information entered by :: Clemetine Marker LPN   Activities of Daily Living In your present state of health, do you have any difficulty performing the following activities: 01/17/2022 01/14/2022  Hearing? Y Y  Comment - -  Vision? N N  Difficulty concentrating or making decisions? Tempie Donning  Walking or climbing stairs? N N  Dressing or bathing? N N  Doing errands, shopping? N N  Preparing Food and eating ? N -  Using the Toilet? N -  In the past six months, have you accidently leaked urine? Y -  Do you have problems with loss of bowel control? N -  Managing your Medications? N -  Managing your Finances? N -  Housekeeping or managing your Housekeeping? N -  Some recent data might be hidden    Patient Care Team: Glean Hess, MD as PCP - General (Internal Medicine) Corey Skains, MD as Consulting Physician (Cardiology) Monia Pouch Consuello Masse, MD as Referring Physician (Obstetrics and Gynecology) Margaretha Sheffield, MD (Otolaryngology)  Indicate any recent Medical Services you may have received from other than [facility] providers in the past year (date may be approximate).     Assessment:   This is a routine wellness examination for [place].  Hearing/Vision screen Hearing Screening - Comments:: Pt had hearing evaluation done last year and qualifies for hearing aids but has not gotten them yet  Vision Screening - Comments:: Annual vision screenings done at [facility] due for exam   Dietary issues and exercise activities discussed: Current Exercise Habits: The patient does not participate in regular exercise at present, Exercise limited by: None identified   Goals Addressed   None    Depression Screen PHQ 2/9 Scores 01/17/2022 01/14/2022 09/16/2021 09/14/2021 09/02/2021 07/09/2021 06/16/2021   PHQ - 2 Score 2 2 4 3 4 4 4   PHQ- 9 Score  4 4 15 12 9 11 15     Fall Risk Fall Risk  01/17/2022 01/14/2022 09/16/2021 09/14/2021 09/02/2021  Falls in the past year? 1 1 1 1 1   Number falls in past yr: 1 1 1 1 1   Comment - - - - -  Injury with Fall? 1 1 1 1 1   Risk for fall due to : History of fall(s) History of fall(s) History of fall(s);Orthopedic patient History of fall(s) History of fall(s);Orthopedic patient;Impaired balance/gait  Follow up Falls prevention discussed Falls evaluation completed Falls evaluation completed Falls evaluation completed Falls evaluation completed    FALL RISK PREVENTION PERTAINING TO THE HOME:  Any stairs in or around the home? Yes  If so, are there any without handrails? No  Home free of loose throw rugs in walkways, pet beds, electrical cords, etc? Yes  Adequate lighting in your home to reduce risk of falls? Yes   ASSISTIVE DEVICES UTILIZED TO PREVENT FALLS:  Life alert? No  Use of a cane, walker or w/c? No  Grab bars in the bathroom? Yes  Shower chair or bench in shower? Yes  Elevated toilet seat or a handicapped toilet? Yes   TIMED UP AND GO:  Was the test performed? No . Telephonic visit.   Cognitive Function: Normal cognitive status assessed by direct observation by this Nurse Health Advisor. No abnormalities found.       6CIT Screen 01/28/2019 11/13/2017  What Year? 0 points 0 points  What month? 0 points 0 points  What time? 0 points 3 points  Count back from 20 0 points 0 points  Months in reverse 0 points 0 points  Repeat phrase 4 points 4 points  Total Score 4 7    Immunizations Immunization History  Administered Date(s) Administered   Fluad Quad(high Dose 65+) 10/09/2019   Influenza, High Dose Seasonal PF 11/12/2018   Influenza,inj,Quad PF,6+ Mos 10/02/2015, 11/07/2016, 11/01/2017   Pneumococcal Conjugate-13 02/05/2014   Pneumococcal Polysaccharide-23 06/09/2008   Tdap 06/09/2010, 10/28/2020    TDAP status: Up to date  Flu  Vaccine status: Declined, Education has been provided regarding the importance of this vaccine but patient still declined. Advised may receive this vaccine at local pharmacy or Health Dept. Aware to provide a copy of the vaccination record if obtained from local pharmacy or Health Dept. Verbalized acceptance and understanding.  Pneumococcal vaccine status: Up to date  Covid-19 vaccine status: Declined, Education has been provided regarding the importance of this vaccine but patient still declined. Advised may receive this vaccine at local pharmacy or Health Dept.or vaccine clinic. Aware to provide a copy of the vaccination record if obtained from local pharmacy or Health Dept. Verbalized acceptance and understanding.  Qualifies for Shingles Vaccine? Yes   Zostavax completed No   Shingrix Completed?: No.    Education has been provided regarding the importance of this vaccine. Patient has been advised to call insurance company to determine out of pocket expense if they have not yet received this vaccine. Advised may also receive vaccine at local pharmacy or Health Dept. Verbalized acceptance and understanding.  Screening Tests Health Maintenance  Topic Date Due   Zoster Vaccines- Shingrix (1 of 2) Never done   OPHTHALMOLOGY EXAM  07/14/2021   INFLUENZA VACCINE  03/25/2022 (Originally 07/26/2021)   MAMMOGRAM  04/27/2022   HEMOGLOBIN A1C  07/14/2022   FOOT EXAM  01/14/2023   TETANUS/TDAP  10/28/2030   Pneumonia Vaccine 22+ Years old  Completed   DEXA SCAN  Completed  Hepatitis C Screening  Completed   HPV VACCINES  Aged Out   COVID-19 Vaccine  Discontinued    Health Maintenance  Health Maintenance Due  Topic Date Due   Zoster Vaccines- Shingrix (1 of 2) Never done   OPHTHALMOLOGY EXAM  07/14/2021    Colorectal cancer screening: No longer required.   Mammogram status: Completed 04/27/21. Repeat every year  Bone Density status: Completed 03/04/20. Results reflect: Bone density results:  OSTEOPENIA. Repeat every 2 years. Pt declines   Lung Cancer Screening: (Low Dose CT Chest recommended if Age 64-80 years, 30 pack-year currently smoking OR have quit w/in 15years.) does not qualify.    Additional Screening:  Hepatitis C Screening: does qualify; Completed 07/14/20  Vision Screening: Recommended annual ophthalmology exams for early detection of glaucoma and other disorders of the eye. Is the patient up to date with their annual eye exam?  No  Who is the provider or what is the name of the office in which the patient attends annual eye exams? [facility]   Dental Screening: Recommended annual dental exams for proper oral hygiene  Community Resource Referral / Chronic Care Management: CRR required this visit?  No   CCM required this visit?  No      Plan:     I have personally reviewed and noted the following in the patients chart:   Medical and social history Use of alcohol, tobacco or illicit drugs  Current medications and supplements including opioid prescriptions.  Functional ability and status Nutritional status Physical activity Advanced directives List of other physicians Hospitalizations, surgeries, and ER visits in previous 12 months Vitals Screenings to include cognitive, depression, and falls Referrals and appointments  In addition, I have reviewed and discussed with patient certain preventive protocols, quality metrics, and best practice recommendations. A written personalized care plan for preventive services as well as general preventive health recommendations were provided to patient.     Clemetine Marker, LPN   04/29/6978   Nurse Notes: none

## 2022-01-17 NOTE — Patient Instructions (Signed)
Samantha Lang , Thank you for taking time to come for your Medicare Wellness Visit. I appreciate your ongoing commitment to your health goals. Please review the following plan we discussed and let me know if I can assist you in the future.   Screening recommendations/referrals: Colonoscopy: no longer required Mammogram: done 04/27/21 Bone Density: done 03/04/20 Recommended yearly ophthalmology/optometry visit for glaucoma screening and checkup Recommended yearly dental visit for hygiene and checkup  Vaccinations: Influenza vaccine: declined Pneumococcal vaccine: done 02/05/14 Tdap vaccine: done 10/28/20 Shingles vaccine: Shingrix discussed. Please contact your pharmacy for coverage information.  Covid-19: declined  Advanced directives: Please bring a copy of your health care power of attorney and living will to the office at your convenience once you have completed those documents.   Conditions/risks identified: Recommend fall prevention in the home  Next appointment: Follow up in one year for your annual wellness visit    Preventive Care 65 Years and Older, Female Preventive care refers to lifestyle choices and visits with your health care provider that can promote health and wellness. What does preventive care include? A yearly physical exam. This is also called an annual well check. Dental exams once or twice a year. Routine eye exams. Ask your health care provider how often you should have your eyes checked. Personal lifestyle choices, including: Daily care of your teeth and gums. Regular physical activity. Eating a healthy diet. Avoiding tobacco and drug use. Limiting alcohol use. Practicing safe sex. Taking low-dose aspirin every day. Taking vitamin and mineral supplements as recommended by your health care provider. What happens during an annual well check? The services and screenings done by your health care provider during your annual well check will depend on your age,  overall health, lifestyle risk factors, and family history of disease. Counseling  Your health care provider may ask you questions about your: Alcohol use. Tobacco use. Drug use. Emotional well-being. Home and relationship well-being. Sexual activity. Eating habits. History of falls. Memory and ability to understand (cognition). Work and work Statistician. Reproductive health. Screening  You may have the following tests or measurements: Height, weight, and BMI. Blood pressure. Lipid and cholesterol levels. These may be checked every 5 years, or more frequently if you are over 72 years old. Skin check. Lung cancer screening. You may have this screening every year starting at age 51 if you have a 30-pack-year history of smoking and currently smoke or have quit within the past 15 years. Fecal occult blood test (FOBT) of the stool. You may have this test every year starting at age 41. Flexible sigmoidoscopy or colonoscopy. You may have a sigmoidoscopy every 5 years or a colonoscopy every 10 years starting at age 20. Hepatitis C blood test. Hepatitis B blood test. Sexually transmitted disease (STD) testing. Diabetes screening. This is done by checking your blood sugar (glucose) after you have not eaten for a while (fasting). You may have this done every 1-3 years. Bone density scan. This is done to screen for osteoporosis. You may have this done starting at age 81. Mammogram. This may be done every 1-2 years. Talk to your health care provider about how often you should have regular mammograms. Talk with your health care provider about your test results, treatment options, and if necessary, the need for more tests. Vaccines  Your health care provider may recommend certain vaccines, such as: Influenza vaccine. This is recommended every year. Tetanus, diphtheria, and acellular pertussis (Tdap, Td) vaccine. You may need a Td booster every 10  years. Zoster vaccine. You may need this after age  28. Pneumococcal 13-valent conjugate (PCV13) vaccine. One dose is recommended after age 74. Pneumococcal polysaccharide (PPSV23) vaccine. One dose is recommended after age 91. Talk to your health care provider about which screenings and vaccines you need and how often you need them. This information is not intended to replace advice given to you by your health care provider. Make sure you discuss any questions you have with your health care provider. Document Released: 01/08/2016 Document Revised: 08/31/2016 Document Reviewed: 10/13/2015 Elsevier Interactive Patient Education  2017 [place] Prevention in the Home Falls can cause injuries. They can happen to people of all ages. There are many things you can do to make your home safe and to help prevent falls. What can I do on the outside of my home? Regularly fix the edges of walkways and driveways and fix any cracks. Remove anything that might make you trip as you walk through a door, such as a raised step or threshold. Trim any bushes or trees on the path to your home. Use bright outdoor lighting. Clear any walking paths of anything that might make someone trip, such as rocks or tools. Regularly check to see if handrails are loose or broken. Make sure that both sides of any steps have handrails. Any raised decks and porches should have guardrails on the edges. Have any leaves, snow, or ice cleared regularly. Use sand or salt on walking paths during winter. Clean up any spills in your garage right away. This includes oil or grease spills. What can I do in the bathroom? Use night lights. Install grab bars by the toilet and in the tub and shower. Do not use towel bars as grab bars. Use non-skid mats or decals in the tub or shower. If you need to sit down in the shower, use a plastic, non-slip stool. Keep the floor dry. Clean up any water that spills on the floor as soon as it happens. Remove soap buildup in the tub or shower  regularly. Attach bath mats securely with double-sided non-slip rug tape. Do not have throw rugs and other things on the floor that can make you trip. What can I do in the bedroom? Use night lights. Make sure that you have a light by your bed that is easy to reach. Do not use any sheets or blankets that are too big for your bed. They should not hang down onto the floor. Have a firm chair that has side arms. You can use this for support while you get dressed. Do not have throw rugs and other things on the floor that can make you trip. What can I do in the kitchen? Clean up any spills right away. Avoid walking on wet floors. Keep items that you use a lot in easy-to-reach places. If you need to reach something above you, use a strong step stool that has a grab bar. Keep electrical cords out of the way. Do not use floor polish or wax that makes floors slippery. If you must use wax, use non-skid floor wax. Do not have throw rugs and other things on the floor that can make you trip. What can I do with my stairs? Do not leave any items on the stairs. Make sure that there are handrails on both sides of the stairs and use them. Fix handrails that are broken or loose. Make sure that handrails are as long as the stairways. Check any carpeting to make sure  that it is firmly attached to the stairs. Fix any carpet that is loose or worn. Avoid having throw rugs at the top or bottom of the stairs. If you do have throw rugs, attach them to the floor with carpet tape. Make sure that you have a light switch at the top of the stairs and the bottom of the stairs. If you do not have them, ask someone to add them for you. What else can I do to help prevent falls? Wear shoes that: Do not have high heels. Have rubber bottoms. Are comfortable and fit you well. Are closed at the toe. Do not wear sandals. If you use a stepladder: Make sure that it is fully opened. Do not climb a closed stepladder. Make sure that  both sides of the stepladder are locked into place. Ask someone to hold it for you, if possible. Clearly mark and make sure that you can see: Any grab bars or handrails. First and last steps. Where the edge of each step is. Use tools that help you move around (mobility aids) if they are needed. These include: Canes. Walkers. Scooters. Crutches. Turn on the lights when you go into a dark area. Replace any light bulbs as soon as they burn out. Set up your furniture so you have a clear path. Avoid moving your furniture around. If any of your floors are uneven, fix them. If there are any pets around you, be aware of where they are. Review your medicines with your doctor. Some medicines can make you feel dizzy. This can increase your chance of falling. Ask your doctor what other things that you can do to help prevent falls. This information is not intended to replace advice given to you by your health care provider. Make sure you discuss any questions you have with your health care provider. Document Released: 10/08/2009 Document Revised: 05/19/2016 Document Reviewed: 01/16/2015 Elsevier Interactive Patient Education  2017 [employer].

## 2022-01-18 ENCOUNTER — Encounter: Payer: Self-pay | Admitting: Internal Medicine

## 2022-01-20 ENCOUNTER — Telehealth: Payer: Self-pay

## 2022-01-20 NOTE — Telephone Encounter (Signed)
Spoke to pt. Reminded her to call and schedule her mammogram. Number provided for pt to call [phone]. Pt verbalized understanding.  KP

## 2022-01-28 ENCOUNTER — Other Ambulatory Visit: Payer: Self-pay | Admitting: Internal Medicine

## 2022-01-28 DIAGNOSIS — E119 Type 2 diabetes mellitus without complications: Secondary | ICD-10-CM

## 2022-01-28 MED ORDER — SITAGLIPTIN PHOSPHATE 100 MG PO TABS: 100.0000 mg | ORAL_TABLET | Freq: Every day | ORAL | 0 refills | Status: DC

## 2022-01-28 NOTE — Telephone Encounter (Signed)
Requested Prescriptions  Pending Prescriptions Disp Refills   sitaGLIPtin (JANUVIA) 100 MG tablet 90 tablet 0    Sig: Take 1 tablet (100 mg total) by mouth daily.     Endocrinology:  Diabetes - DPP-4 Inhibitors Passed - 01/28/2022 10:21 AM      Passed - HBA1C is between 0 and 7.9 and within 180 days    Hemoglobin A1C  Date Value Ref Range Status  01/14/2022 7.0 (A) 4.0 - 5.6 % Final   Hgb A1c MFr Bld  Date Value Ref Range Status  07/09/2021 7.2 (H) 4.8 - 5.6 % Final    Comment:             Prediabetes: 5.7 - 6.4          Diabetes: >6.4          Glycemic control for adults with diabetes: <7.0          Passed - Cr in normal range and within 360 days    Creatinine, Ser  Date Value Ref Range Status  09/14/2021 0.80 0.57 - 1.00 mg/dL Final         Passed - Valid encounter within last 6 months    Recent Outpatient Visits          2 weeks ago Type II diabetes mellitus with complication [facility])   [facility] Glean Hess, MD   4 months ago Right rotator cuff tear arthropathy   [facility] Montel Culver, MD   4 months ago Annual physical exam   [facility] Glean Hess, MD   4 months ago Right rotator cuff tear arthropathy   [facility] Montel Culver, MD   6 months ago Frequent falls   [facility] Glean Hess, MD      Future Appointments            In 3 months Army Melia Jesse Sans, MD [facility], [place]   In 7 months Army Melia, Jesse Sans, MD [facility], [facility]

## 2022-01-28 NOTE — Telephone Encounter (Signed)
Copied from [place] [id number]. Topic: Quick Communication - Rx Refill/Question >> Jan 28, 2022 10:14 AM Yvette Rack wrote: Medication: JANUVIA 100 MG tablet  Has the patient contacted their pharmacy? No. Pt requests Rx be sent to another pharmacy (Agent: If no, request that the patient contact the pharmacy for the refill. If patient does not wish to contact the pharmacy document the reason why and proceed with request.) (Agent: If yes, when and what did the pharmacy advise?)  Preferred Pharmacy (with phone number or street name): [place], [place]  Phone:  [phone] Fax:  [phone]  Has the patient been seen for an appointment in the last year OR does the patient have an upcoming appointment? Yes.    Agent: Please be advised that RX refills may take up to 3 business days. We ask that you follow-up with your pharmacy.

## 2022-02-17 ENCOUNTER — Ambulatory Visit
Admission: EM | Admit: 2022-02-17 | Discharge: 2022-02-17 | Disposition: A | Discharge status: Home / Self Care | Payer: Medicare HMO | Attending: Emergency Medicine | Admitting: Emergency Medicine

## 2022-02-17 ENCOUNTER — Ambulatory Visit: Payer: Self-pay

## 2022-02-17 ENCOUNTER — Other Ambulatory Visit: Payer: Self-pay

## 2022-02-17 DIAGNOSIS — J029 Acute pharyngitis, unspecified: Secondary | ICD-10-CM | POA: Insufficient documentation

## 2022-02-17 DIAGNOSIS — J069 Acute upper respiratory infection, unspecified: Secondary | ICD-10-CM | POA: Diagnosis not present

## 2022-02-17 DIAGNOSIS — Z28310 Unvaccinated for covid-19: Secondary | ICD-10-CM | POA: Insufficient documentation

## 2022-02-17 DIAGNOSIS — R0981 Nasal congestion: Secondary | ICD-10-CM | POA: Insufficient documentation

## 2022-02-17 DIAGNOSIS — R059 Cough, unspecified: Secondary | ICD-10-CM | POA: Insufficient documentation

## 2022-02-17 DIAGNOSIS — Z20822 Contact with and (suspected) exposure to covid-19: Secondary | ICD-10-CM | POA: Diagnosis not present

## 2022-02-17 LAB — RESP PANEL BY RT-PCR (FLU A&B, COVID) ARPGX2
Influenza A by PCR: NEGATIVE
Influenza B by PCR: NEGATIVE
SARS Coronavirus 2 by RT PCR: NEGATIVE

## 2022-02-17 MED ORDER — BENZONATATE 100 MG PO CAPS: 200.0000 mg | ORAL_CAPSULE | Freq: Three times a day (TID) | ORAL | 0 refills | Status: DC | PRN

## 2022-02-17 MED ORDER — AZITHROMYCIN 250 MG PO TABS: 250.0000 mg | ORAL_TABLET | Freq: Every day | ORAL | 0 refills | Status: DC

## 2022-02-17 MED ORDER — PROMETHAZINE-DM 6.25-15 MG/5ML PO SYRP: 5.0000 mL | ORAL_SOLUTION | Freq: Four times a day (QID) | ORAL | 0 refills | Status: DC | PRN

## 2022-02-17 NOTE — Discharge Instructions (Addendum)
Your symptoms today are most likely being caused by a virus and should steadily improve in time it can take up to 7 to 10 days before you truly start to see a turnaround however things will get better  COVID and flu testing negative  May use 2 Tessalon pills every 8 hours as needed to help calm coughing  You may continue use of Promethazine DM every 6 hours for additional comfort  , If symptoms are still present after 10 days with no improvement there will be an antibiotic at the pharmacy waiting for you, take azithromycin as directed on packaging    You can take Tylenol and/or Ibuprofen as needed for fever reduction and pain relief.   For cough: honey 1/2 to 1 teaspoon (you can dilute the honey in water or another fluid).  You can also use guaifenesin and dextromethorphan for cough. You can use a humidifier for chest congestion and cough.  If you don't have a humidifier, you can sit in the bathroom with the hot shower running.      For sore throat: try warm salt water gargles, cepacol lozenges, throat spray, warm tea or water with lemon/honey, popsicles or ice, or OTC cold relief medicine for throat discomfort.   For congestion: take a daily anti-histamine like Zyrtec, Claritin, and a oral decongestant, such as pseudoephedrine.  You can also use Flonase 1-2 sprays in each nostril daily.   It is important to stay hydrated: drink plenty of fluids (water, gatorade/powerade/pedialyte, juices, or teas) to keep your throat moisturized and help further relieve irritation/discomfort.

## 2022-02-17 NOTE — Telephone Encounter (Signed)
2nd attempt, Patient called, left VM to return the call to the office to discuss symptoms with a nurse.   Summary: cough,stuffy/runny nose and no appetite   Pt stated on Sunday she started with cough,stuffy/runny nose and no appetite. Pt stated she is feeling crappy and not feeling well has took COVID test and they have been negative.   No appointments for today. Pt declined appointment available for tomorrow.   Seeking clinical advice

## 2022-02-17 NOTE — ED Triage Notes (Signed)
Pt c/o cough, sore throat, Loss of appetite, congestion x5days.  Pt took a covid test yesterday and it was negative.  Pt denies being around anyone who was sick.

## 2022-02-17 NOTE — Telephone Encounter (Signed)
3rd attempt. Pt called, LVMTCB to discuss with a nurse. Routing to practice per protocol.

## 2022-02-17 NOTE — ED Provider Notes (Signed)
[facility]    CSN: [id number] Arrival date & time: 02/17/22  1032      History   Chief Complaint Chief Complaint  Patient presents with   Cough   Nasal Congestion   Sore Throat    HPI Samantha Lang is a 81 y.o. female.   Patient presents with nasal congestion, left ear pain, rhinorrhea, sore throat and a nonproductive cough for 5 days.  Decreased appetite, minimal fluid intake.  No known sick contacts.  Unvaccinated.   Attempted use of over-the-counter medication has not been effective.  Home COVID test negative.  Past Medical History:  Diagnosis Date   Allergy    Anxiety    Aortic valve stenosis, mild    Depression    mild   Diabetes mellitus age 31   GERD (gastroesophageal reflux disease)    Heart murmur    Hemorrhoids    Hyperlipidemia    Hypertension 2003   Incontinence    Female stress   Mitral incompetence    Personal history of chemotherapy    3 treatments   Personal history of radiation therapy    5 treatments   Postmenopausal atrophic vaginitis    Torn rotator cuff    Uterine cancer ([place])    treated    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   LVH (left ventricular hypertrophy) due to hypertensive disease, without heart failure 04/11/2019   Foot pain, right 10/10/2018   Ganglion cyst of left foot 08/29/2018   Xerosis of skin 08/29/2018   Palpitations 08/08/2018   Colon cancer screening    Trochanteric bursitis of left hip 12/07/2016   History of endometrial cancer 08/03/2016   Edema leg 04/13/2016   Type II diabetes mellitus with complication ([place]) 67/34/1936   Shoulder pain, left 02/12/2016   Acid reflux 10/02/2015   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   Hyperlipidemia associated with type 2 diabetes mellitus ([place]) 04/08/2015   Aortic heart valve narrowing 03/26/2015   Bilateral carotid artery stenosis 03/26/2015   Anxiety    Environmental and seasonal allergies    Postmenopausal atrophic vaginitis    Depression, major, recurrent, moderate ([place])    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 Rosaria Ferries, eyelid lift   POLYPECTOMY  09/22/2017   Procedure: POLYPECTOMY;  Surgeon: Lucilla Lame, MD;  Location: [facility];  Service: Gastroenterology;;   TUBAL LIGATION      OB History     Gravida  2   Para  2   Term  2   Preterm      AB      Living  2      SAB      IAB  Ectopic      Multiple      Live Births  2            Home Medications    Prior to Admission medications   Medication Sig Start Date End Date Taking? Authorizing Provider  allopurinol (ZYLOPRIM) 100 MG tablet TAKE 1 TABLET BY MOUTH EVERY DAY 11/26/21  Yes Glean Hess, MD  amLODipine (NORVASC) 5 MG tablet TAKE 1 TABLET BY MOUTH TWICE A DAY 03/19/21  Yes Glean Hess, MD  aspirin 81 MG tablet Take 81 mg by mouth daily.   Yes [provider]  atorvastatin (LIPITOR) 10 MG tablet Take 1 tablet by mouth daily. 07/12/15  Yes [provider]  carvedilol (COREG) 6.25 MG tablet Take 6.25 mg by mouth 2 (two) times daily. 05/22/21  Yes [provider]  colchicine 0.6 MG tablet Take 0.6 mg by mouth daily. PRN only, takes rarely   Yes [provider]  diclofenac (VOLTAREN) 50 MG EC tablet Take 1 tablet (50 mg total) by mouth 2 (two) times daily as needed. 09/16/21  Yes Montel Culver, MD  Multiple Vitamin (MULTIVITAMIN) capsule Take 1 capsule by mouth daily.   Yes [provider]  [facility] ULTRA test strip TEST TWICE DAILY 11/26/21  Yes Glean Hess, MD  pantoprazole (PROTONIX) 40 MG tablet TAKE 1 TABLET BY MOUTH EVERY DAY 11/25/21  Yes Glean Hess, MD  psyllium (METAMUCIL SMOOTH TEXTURE) 58.6 % powder Please use one does every other day. 10/17/19  Yes Vonda Antigua B, MD  sitaGLIPtin (JANUVIA) 100 MG tablet Take 1 tablet (100 mg total) by mouth daily. 01/28/22  Yes Glean Hess, MD  telmisartan-hydrochlorothiazide (MICARDIS HCT) 80-25 MG tablet Take 1 tablet by mouth daily. 06/08/17  Yes [provider]  venlafaxine XR (EFFEXOR-XR) 150 MG 24 hr capsule TAKE (1) CAPSULE BY MOUTH EVERY DAY 12/22/21  Yes Glean Hess, MD  VITAMIN D PO Take 5,000 Units by mouth daily.   Yes [provider]    Family History Family History  Problem Relation Age of Onset   Breast cancer Paternal Aunt    Breast cancer Paternal Grandmother 45   Cancer Father 75       lung   Stroke Mother    Hypertension Mother    Cancer Brother        prostate   Depression Brother    Ovarian cancer Neg Hx    Colon cancer Neg Hx    Diabetes Neg Hx     Social History Social History   Tobacco Use   Smoking status: Former    Packs/day: 0.25    Years: 20.00    Pack years: 5.00    Types: Cigarettes    Quit date: 09/25/1988    Years since quitting: 33.4   Smokeless tobacco: Never  Vaping Use   Vaping Use: Never used  Substance Use Topics   Alcohol use: Yes    Alcohol/week: 6.0 standard drinks    Types: 2 Glasses of wine, 2 Cans of beer, 2 Shots of liquor per week   Drug use: Never     Allergies   Atorvastatin, Cephalexin, Clarithromycin, Lipitor [atorvastatin calcium], Cephalexin, and  Clarithromycin   Review of Systems Review of Systems  Constitutional:  Positive for appetite change. Negative for activity change, chills, diaphoresis, fatigue, fever and unexpected weight change.  HENT:  Positive for congestion, ear pain, rhinorrhea and voice change. Negative for dental problem, drooling, ear discharge,  facial swelling, hearing loss, mouth sores, nosebleeds, postnasal drip, sinus pressure, sinus pain, sneezing, sore throat, tinnitus and trouble swallowing.   Respiratory:  Positive for cough. Negative for apnea, choking, chest tightness, shortness of breath, wheezing and stridor.   Cardiovascular: Negative.   Neurological: Negative.     Physical Exam Triage Vital Signs ED Triage Vitals  Enc Vitals Group     BP 02/17/22 1054 (!) 141/68     Pulse Rate 02/17/22 1054 69     Resp 02/17/22 1054 18     Temp 02/17/22 1054 99.3 F (37.4 C)     Temp Source 02/17/22 1054 Oral     SpO2 02/17/22 1054 95 %     Weight 02/17/22 1052 172 lb (78 kg)     Height 02/17/22 1052 5\' 2"  (1.575 m)     Head Circumference --      Peak Flow --      Pain Score 02/17/22 1052 0     Pain Loc --      Pain Edu? --      Excl. in [place]? --    No data found.  Updated Vital Signs BP (!) 141/68 (BP Location: Left Arm)    Pulse 69    Temp 99.3 F (37.4 C) (Oral)    Resp 18    Ht 5\' 2"  (1.575 m)    Wt 172 lb (78 kg)    SpO2 95%    BMI 31.46 kg/m   Visual Acuity Right Eye Distance:   Left Eye Distance:   Bilateral Distance:    Right Eye Near:   Left Eye Near:    Bilateral Near:     Physical Exam Constitutional:      Appearance: Normal appearance.  HENT:     Head: Normocephalic.     Right Ear: Tympanic membrane, ear canal and external ear normal.     Left Ear: Tympanic membrane, ear canal and external ear normal.     Nose: Congestion present. No rhinorrhea.     Mouth/Throat:     Mouth: Mucous membranes are moist.     Pharynx: Oropharynx is clear.  Eyes:     Extraocular Movements:  Extraocular movements intact.  Cardiovascular:     Rate and Rhythm: Normal rate and regular rhythm.     Pulses: Normal pulses.     Heart sounds: Normal heart sounds.  Pulmonary:     Effort: Pulmonary effort is normal.     Breath sounds: Normal breath sounds.  Musculoskeletal:     Cervical back: Normal range of motion and neck supple.  Skin:    General: Skin is warm and dry.  Neurological:     Mental Status: She is alert and oriented to person, place, and time. Mental status is at baseline.  Psychiatric:        Mood and Affect: Mood normal.        Behavior: Behavior normal.     UC Treatments / Results  Labs (all labs ordered are listed, but only abnormal results are displayed) Labs Reviewed - No data to display  EKG   Radiology No results found.  Procedures Procedures (including critical care time)  Medications Ordered in UC Medications - No data to display  Initial Impression / Assessment and Plan / UC Course  I have reviewed the triage vital signs and the nursing notes.  Pertinent labs & imaging results that were available during my care of the patient were reviewed by me and considered in my  medical decision making (see chart for details).  Viral URI with cough  Vital signs are stable, patient is in no signs of distress, COVID and flu testing negative, discussed findings with patient and daughter, Lavella Lemons and Promethazine DM prescribed as cough is most worrisome symptom, patient has already been taking old prescription of Promethazine DM at home and is able to tolerate medication, watchful wait antibiotic placed at pharmacy for day 10 of illness, allergy to cephalosporins confirmed, unsure if able to tolerate penicillins, azithromycin prescribed as she has had success with this medication in the past, may continue use of over-the-counter medications for additional support, urgent care or PCP follow-up as needed Final Clinical Impressions(s) / UC Diagnoses   Final  diagnoses:  None   Discharge Instructions   None    ED Prescriptions   None    PDMP not reviewed this encounter.   Hans Eden, NP 02/17/22 1223

## 2022-02-17 NOTE — Telephone Encounter (Signed)
Pt stated on Sunday she started with cough,stuffy/runny nose and no appetite. Pt stated she is feeling crappy and not feeling well has took COVID test and they have been negative.   No appointments for today. Pt declined appointment available for tomorrow.   Seeking clinical advice.   Left message to call back about symptoms.

## 2022-03-07 ENCOUNTER — Other Ambulatory Visit: Payer: Self-pay | Admitting: Internal Medicine

## 2022-03-07 ENCOUNTER — Encounter: Payer: Self-pay | Admitting: Internal Medicine

## 2022-03-07 DIAGNOSIS — E119 Type 2 diabetes mellitus without complications: Secondary | ICD-10-CM

## 2022-03-07 DIAGNOSIS — M10079 Idiopathic gout, unspecified ankle and foot: Secondary | ICD-10-CM

## 2022-03-07 MED ORDER — ATORVASTATIN CALCIUM 10 MG PO TABS: 10.0000 mg | ORAL_TABLET | Freq: Every day | ORAL | 1 refills | Status: DC

## 2022-03-07 MED ORDER — CARVEDILOL 6.25 MG PO TABS: 6.2500 mg | ORAL_TABLET | Freq: Two times a day (BID) | ORAL | 1 refills | Status: DC

## 2022-03-07 MED ORDER — ALLOPURINOL 100 MG PO TABS: 100.0000 mg | ORAL_TABLET | Freq: Every day | ORAL | 0 refills | Status: DC

## 2022-03-07 MED ORDER — SITAGLIPTIN PHOSPHATE 100 MG PO TABS: 100.0000 mg | ORAL_TABLET | Freq: Every day | ORAL | 1 refills | Status: DC

## 2022-03-13 ENCOUNTER — Other Ambulatory Visit: Payer: Self-pay | Admitting: Internal Medicine

## 2022-03-13 ENCOUNTER — Encounter: Payer: Self-pay | Admitting: Internal Medicine

## 2022-03-13 DIAGNOSIS — I10 Essential (primary) hypertension: Secondary | ICD-10-CM

## 2022-03-13 DIAGNOSIS — K219 Gastro-esophageal reflux disease without esophagitis: Secondary | ICD-10-CM

## 2022-03-13 MED ORDER — AMLODIPINE BESYLATE 5 MG PO TABS: 5.0000 mg | ORAL_TABLET | Freq: Two times a day (BID) | ORAL | 1 refills | Status: DC

## 2022-03-13 MED ORDER — PANTOPRAZOLE SODIUM 40 MG PO TBEC: 40.0000 mg | DELAYED_RELEASE_TABLET | Freq: Every day | ORAL | 1 refills | Status: DC

## 2022-03-22 ENCOUNTER — Telehealth: Payer: Self-pay | Admitting: Internal Medicine

## 2022-03-22 DIAGNOSIS — F331 Major depressive disorder, recurrent, moderate: Secondary | ICD-10-CM

## 2022-03-23 NOTE — Telephone Encounter (Signed)
Pt called to report that she took her last pill on Sunday, she is requesting a call back from the clinic.  ? ? ?Best contact: [phone] ?

## 2022-03-23 NOTE — Telephone Encounter (Signed)
Requested Prescriptions  ?Pending Prescriptions Disp Refills  ?? venlafaxine XR (EFFEXOR-XR) 150 MG 24 hr capsule [Pharmacy Med Name: VENLAFAXINE HCL ER 150 MG CAP] 90 capsule 0  ?  Sig: TAKE (1) CAPSULE BY MOUTH EVERY DAY  ?  ? Psychiatry: Antidepressants - SNRI - desvenlafaxine & venlafaxine Failed - 03/22/2022 10:25 AM  ?  ?  Failed - Last BP in normal range  ?  BP Readings from Last 1 Encounters:  ?02/17/22 (!) 141/68  ?   ?  ?  Failed - Lipid Panel in normal range within the last 12 months  ?  Cholesterol, Total  ?Date Value Ref Range Status  ?07/09/2021 159 100 - 199 mg/dL Final  ? ?LDL Chol Calc (NIH)  ?Date Value Ref Range Status  ?07/09/2021 65 0 - 99 mg/dL Final  ? ?Direct LDL  ?Date Value Ref Range Status  ?09/26/2011 138.3 mg/dL Final  ?  Comment:  ?  Optimal:  <100 mg/dLNear or Above Optimal:  100-129 mg/dLBorderline High:  130-159 mg/dLHigh:  160-189 mg/dLVery High:  >190 mg/dL  ? ?HDL  ?Date Value Ref Range Status  ?07/09/2021 64 >39 mg/dL Final  ? ?Triglycerides  ?Date Value Ref Range Status  ?07/09/2021 181 (H) 0 - 149 mg/dL Final  ? ?  ?  ?  Passed - Cr in normal range and within 360 days  ?  Creatinine, Ser  ?Date Value Ref Range Status  ?09/14/2021 0.80 0.57 - 1.00 mg/dL Final  ?   ?  ?  Passed - Completed PHQ-2 or PHQ-9 in the last 360 days  ?  ?  Passed - Valid encounter within last 6 months  ?  Recent Outpatient Visits   ?      ? 2 months ago Type II diabetes mellitus with complication ([place])  ? [facility] Glean Hess, MD  ? 6 months ago Right rotator cuff tear arthropathy  ? [facility] Montel Culver, MD  ? 6 months ago Annual physical exam  ? [facility] Glean Hess, MD  ? 6 months ago Right rotator cuff tear arthropathy  ? [facility] Montel Culver, MD  ? 8 months ago Frequent falls  ? [facility] Glean Hess, MD  ?  ?  ?Future Appointments   ?        ? In 1 month Army Melia Jesse Sans, MD [facility], [place]  ?  In 5 months Army Melia Jesse Sans, MD [facility], [place]  ?  ? ?  ?  ?  ? ? ?

## 2022-03-23 NOTE — Telephone Encounter (Signed)
Called pt let her know that her prescription was sent to [place] today 03/23/2022. Pt verbalized understanding. ? ?KP ?

## 2022-03-25 ENCOUNTER — Encounter: Payer: Self-pay | Admitting: Internal Medicine

## 2022-03-25 ENCOUNTER — Ambulatory Visit (INDEPENDENT_AMBULATORY_CARE_PROVIDER_SITE_OTHER): Payer: Medicare HMO | Admitting: Internal Medicine

## 2022-03-25 ENCOUNTER — Other Ambulatory Visit: Payer: Self-pay

## 2022-03-25 VITALS — BP 140/48 | HR 86 | Temp 97.9°F | Ht 62.0 in | Wt 163.0 lb

## 2022-03-25 DIAGNOSIS — K219 Gastro-esophageal reflux disease without esophagitis: Secondary | ICD-10-CM

## 2022-03-25 DIAGNOSIS — R058 Other specified cough: Secondary | ICD-10-CM

## 2022-03-25 DIAGNOSIS — E119 Type 2 diabetes mellitus without complications: Secondary | ICD-10-CM

## 2022-03-25 MED ORDER — GUAIFENESIN-CODEINE 100-10 MG/5ML PO SYRP: 5.0000 mL | ORAL_SOLUTION | Freq: Three times a day (TID) | ORAL | 0 refills | Status: DC | PRN

## 2022-03-25 MED ORDER — SITAGLIPTIN PHOSPHATE 100 MG PO TABS: 100.0000 mg | ORAL_TABLET | Freq: Every day | ORAL | 1 refills | Status: DC

## 2022-03-25 NOTE — Patient Instructions (Signed)
Try Claritin 10 mg at bedtime. ? ?Elevate the head of your bed with a brick or other item made for that purpose. ? ? ?

## 2022-03-25 NOTE — Progress Notes (Signed)
? ? ?Date:  03/25/2022  ? ?Name:  Samantha Lang   DOB:  08/12/1941   MRN:  [id number] ? ? ?Chief Complaint: Cough ? ?Cough ?This is a recurrent problem. The current episode started more than 1 month ago. The problem has been waxing and waning. The cough is Non-productive. Associated symptoms include chest pain and shortness of breath. Pertinent negatives include no chills, ear pain, fever, nasal congestion or wheezing. The symptoms are aggravated by lying down.  ? ?Lab Results  ?Component Value Date  ? NA 138 09/14/2021  ? K 3.5 09/14/2021  ? CO2 26 09/14/2021  ? GLUCOSE 186 (H) 09/14/2021  ? BUN 22 09/14/2021  ? CREATININE 0.80 09/14/2021  ? CALCIUM 9.6 09/14/2021  ? EGFR 75 09/14/2021  ? GFRNONAA 54 (L) 11/23/2020  ? ?Lab Results  ?Component Value Date  ? CHOL 159 07/09/2021  ? HDL 64 07/09/2021  ? [place] 65 07/09/2021  ? LDLDIRECT 138.3 09/26/2011  ? TRIG 181 (H) 07/09/2021  ? CHOLHDL 2.5 07/09/2021  ? ?Lab Results  ?Component Value Date  ? TSH 3.190 07/09/2021  ? ?Lab Results  ?Component Value Date  ? HGBA1C 7.0 (A) 01/14/2022  ? ?Lab Results  ?Component Value Date  ? WBC 7.4 07/09/2021  ? HGB 13.3 07/09/2021  ? HCT 38.5 07/09/2021  ? MCV 90 07/09/2021  ? PLT 152 07/09/2021  ? ?Lab Results  ?Component Value Date  ? ALT 35 (H) 09/14/2021  ? AST 35 09/14/2021  ? ALKPHOS 84 09/14/2021  ? BILITOT 0.5 09/14/2021  ? ?No results found for: 25OHVITD2, [place], VD25OH  ? ?Review of Systems  ?Constitutional:  Negative for chills, fatigue and fever.  ?HENT:  Negative for ear pain.   ?Respiratory:  Positive for cough and shortness of breath. Negative for chest tightness and wheezing.   ?Cardiovascular:  Positive for chest pain and palpitations.  ?Gastrointestinal:  Negative for constipation and diarrhea.  ? ?Patient Active Problem List  ? Diagnosis Date Noted  ? Nodule of apex of left lung 09/14/2021  ? Biceps tendinitis of right upper extremity 09/02/2021  ? Acquired trigger finger 01/13/2021  ? Radial styloid  tenosynovitis 01/13/2021  ? 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  ? 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  ? LVH (left ventricular hypertrophy) due to hypertensive disease, without heart failure 04/11/2019  ? Foot pain, right 10/10/2018  ? Ganglion cyst of left foot 08/29/2018  ? Xerosis of skin 08/29/2018  ? Palpitations 08/08/2018  ? Colon cancer screening   ? Trochanteric bursitis of left hip 12/07/2016  ? History of endometrial cancer 08/03/2016  ? Edema leg 04/13/2016  ? Type II diabetes mellitus with complication ([place]) 48/18/5631  ? Shoulder pain, left 02/12/2016  ? Acid reflux 10/02/2015  ? 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  ? 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  ? Hyperlipidemia associated with type 2 diabetes mellitus ([place]) 04/08/2015  ? Aortic heart valve narrowing 03/26/2015  ? Bilateral carotid artery stenosis 03/26/2015  ? Anxiety   ? Environmental and seasonal allergies   ? Postmenopausal atrophic vaginitis   ? Depression, major, recurrent, moderate ([place])   ? 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 Essential hypertension   ? ? ?Allergies  ?Allergen Reactions  ? Cephalexin Rash  ? Clarithromycin Rash  ? ? ?Past Surgical History:  ?Procedure Laterality Date  ? ABDOMINAL HYSTERECTOMY  07/2016  ? BREAST BIOPSY Right 03/21/08  ? right, benign   ? COLONOSCOPY WITH PROPOFOL N/A  09/22/2017  ? Procedure: COLONOSCOPY WITH PROPOFOL;  Surgeon: Lucilla Lame, MD;  Location: [facility];  Service: Gastroenterology;  Laterality: N/A;  diabetic  ? EYE SURGERY  Oct 2009  ? for ptosis,  Dr. Rosaria Ferries, eyelid lift  ? POLYPECTOMY  09/22/2017  ? Procedure: POLYPECTOMY;  Surgeon: Lucilla Lame, MD;  Location: [facility];  Service: Gastroenterology;;  ? TUBAL LIGATION    ? ? ?Social History  ? ?Tobacco Use  ? Smoking status: Former  ?  Packs/day: 0.25  ?  Years: 20.00  ?  Pack years: 5.00  ?  Types: Cigarettes  ?  Quit date: 09/25/1988  ?  Years since quitting: 33.5  ?  Smokeless tobacco: Never  ?Vaping Use  ? Vaping Use: Never used  ?Substance Use Topics  ? Alcohol use: Yes  ?  Alcohol/week: 6.0 standard drinks  ?  Types: 2 Glasses of wine, 2 Cans of beer, 2 Shots of liquor per week  ? Drug use: Never  ? ? ? ?Medication list has been reviewed and updated. ? ?Current Meds  ?Medication Sig  ? allopurinol (ZYLOPRIM) 100 MG tablet Take 1 tablet (100 mg total) by mouth daily.  ? amLODipine (NORVASC) 5 MG tablet Take 1 tablet (5 mg total) by mouth 2 (two) times daily.  ? aspirin 81 MG tablet Take 81 mg by mouth daily.  ? atorvastatin (LIPITOR) 10 MG tablet Take 1 tablet (10 mg total) by mouth daily.  ? carvedilol (COREG) 6.25 MG tablet Take 1 tablet (6.25 mg total) by mouth 2 (two) times daily.  ? colchicine 0.6 MG tablet Take 0.6 mg by mouth daily. PRN only, takes rarely  ? diclofenac (VOLTAREN) 50 MG EC tablet Take 1 tablet (50 mg total) by mouth 2 (two) times daily as needed.  ? Multiple Vitamin (MULTIVITAMIN) capsule Take 1 capsule by mouth daily.  ? ONETOUCH ULTRA test strip TEST TWICE DAILY  ? pantoprazole (PROTONIX) 40 MG tablet Take 1 tablet (40 mg total) by mouth daily.  ? psyllium (METAMUCIL SMOOTH TEXTURE) 58.6 % powder Please use one does every other day.  ? sitaGLIPtin (JANUVIA) 100 MG tablet Take 1 tablet (100 mg total) by mouth daily.  ? telmisartan-hydrochlorothiazide (MICARDIS HCT) 80-25 MG tablet Take 1 tablet by mouth daily.  ? venlafaxine XR (EFFEXOR-XR) 150 MG 24 hr capsule TAKE (1) CAPSULE BY MOUTH EVERY DAY  ? VITAMIN D PO Take 5,000 Units by mouth daily.  ? ? ? ?  03/25/2022  ? 10:31 AM 01/14/2022  ? 10:34 AM 09/16/2021  ?  1:36 PM 09/14/2021  ?  8:45 AM  ?GAD 7 : Generalized Anxiety Score  ?Nervous, Anxious, on Edge 1 1 2 1   ?Control/stop worrying 1 0 2 1  ?Worry too much - different things 1 0 2 1  ?Trouble relaxing 1 0 2 1  ?Restless 1 0 2 1  ?Easily annoyed or irritable 1 0 2 1  ?Afraid - awful might happen 1 1 2 1   ?Total GAD 7 Score 7 2 14 7   ?Anxiety  Difficulty Somewhat difficult Somewhat difficult Very difficult   ? ? ? ?  03/25/2022  ? 10:31 AM  ?Depression screen PHQ 2/9  ?Decreased Interest 3  ?Down, Depressed, Hopeless 3  ?PHQ - 2 Score 6  ?Altered sleeping 3  ?Tired, decreased energy 3  ?Change in appetite 3  ?Feeling bad or failure about yourself  0  ?Trouble concentrating 1  ?Moving slowly or fidgety/restless 1  ?Suicidal thoughts  0  ?PHQ-9 Score 17  ?Difficult doing work/chores Not difficult at all  ? ? ?BP Readings from Last 3 Encounters:  ?03/25/22 (!) 140/48  ?02/17/22 (!) 141/68  ?01/14/22 138/82  ? ? ?Physical Exam ?Vitals and nursing note reviewed.  ?Constitutional:   ?   General: She is not in acute distress. ?   Appearance: Normal appearance. She is well-developed.  ?HENT:  ?   Head: Normocephalic and atraumatic.  ?   Right Ear: Tympanic membrane normal.  ?   Left Ear: Tympanic membrane normal.  ?   Nose:  ?   Right Sinus: No maxillary sinus tenderness or frontal sinus tenderness.  ?   Left Sinus: No maxillary sinus tenderness or frontal sinus tenderness.  ?   Mouth/Throat:  ?   Pharynx: Oropharynx is clear.  ?Neck:  ?   Vascular: No carotid bruit.  ?Cardiovascular:  ?   Rate and Rhythm: Normal rate and regular rhythm.  ?Pulmonary:  ?   Effort: Pulmonary effort is normal. No respiratory distress.  ?   Breath sounds: Normal breath sounds. No wheezing or rhonchi.  ?Musculoskeletal:  ?   Cervical back: Normal range of motion.  ?Lymphadenopathy:  ?   Cervical: No cervical adenopathy.  ?Skin: ?   General: Skin is warm and dry.  ?   Findings: No rash.  ?Neurological:  ?   Mental Status: She is alert and oriented to person, place, and time.  ?Psychiatric:     ?   Mood and Affect: Mood normal.     ?   Behavior: Behavior normal.  ? ? ?Wt Readings from Last 3 Encounters:  ?03/25/22 163 lb (73.9 kg)  ?02/17/22 172 lb (78 kg)  ?01/14/22 168 lb 12.8 oz (76.6 kg)  ? ? ?BP (!) 140/48 (BP Location: Right Arm, Cuff Size: Large)   Pulse 86   Temp 97.9 ?F (36.6  ?C) (Oral)   Ht 5' 2"  (1.575 m)   Wt 163 lb (73.9 kg)   SpO2 99%   BMI 29.81 kg/m?  ? ?Assessment and Plan: ?1. Other cough ?Likely PND or LPR ?Trial of Claritin 10 mg at bed, ?- guaiFENesin-codeine (ROBITUSSIN AC) 100-10

## 2022-04-06 DIAGNOSIS — R600 Localized edema: Secondary | ICD-10-CM | POA: Diagnosis not present

## 2022-04-06 DIAGNOSIS — E119 Type 2 diabetes mellitus without complications: Secondary | ICD-10-CM | POA: Diagnosis not present

## 2022-04-06 DIAGNOSIS — I350 Nonrheumatic aortic (valve) stenosis: Secondary | ICD-10-CM | POA: Diagnosis not present

## 2022-04-06 DIAGNOSIS — I119 Hypertensive heart disease without heart failure: Secondary | ICD-10-CM | POA: Diagnosis not present

## 2022-04-06 DIAGNOSIS — I10 Essential (primary) hypertension: Secondary | ICD-10-CM | POA: Diagnosis not present

## 2022-04-06 DIAGNOSIS — E782 Mixed hyperlipidemia: Secondary | ICD-10-CM | POA: Diagnosis not present

## 2022-04-06 DIAGNOSIS — I6523 Occlusion and stenosis of bilateral carotid arteries: Secondary | ICD-10-CM | POA: Diagnosis not present

## 2022-05-02 ENCOUNTER — Telehealth: Payer: Self-pay

## 2022-05-02 NOTE — Telephone Encounter (Signed)
Called pt left VM to call back. ? ?Called pt to let her know that [facility] sent  a fax to [place] that there has been a recall on Pantoprazole 40 MG due to pills looking dirty or discolored. If  pills are discolored pt needs to call [phone].  ? ?[facility] nurse may give results to patient if they return call to clinic, a CRM has been created. ? ?KP ?

## 2022-05-03 ENCOUNTER — Telehealth: Payer: Self-pay | Admitting: Internal Medicine

## 2022-05-03 ENCOUNTER — Encounter: Payer: Self-pay | Admitting: Internal Medicine

## 2022-05-03 NOTE — Telephone Encounter (Signed)
Attempted to call patient with Rx information- left message to call office ?

## 2022-05-03 NOTE — Telephone Encounter (Signed)
Patient returned PCP nurse call. Informed patient of the below. Patient states pills are yellow and does not look like anything is wrong and will keep pills/  ? ? ?Copied from [facility] [phone]. Topic: Quick Sport and exercise psychologist Patient (Clinic Use ONLY) ?>> May 02, 2022  3:22 PM Person, Ival Bible, CMA wrote: ?Reason for CRM: Called pt left VM to call back. ? ?Called pt to let her know that [facility] sent  a fax to [place] that there has been a recall on Pantoprazole 40 MG due to pills looking dirty or discolored. If  pills are discolored pt needs to call [phone].  ? ?KP ?

## 2022-05-03 NOTE — Telephone Encounter (Signed)
Noted  KP 

## 2022-05-04 NOTE — Telephone Encounter (Signed)
Patient called, left VM to return the call to the office. 

## 2022-05-17 ENCOUNTER — Ambulatory Visit (INDEPENDENT_AMBULATORY_CARE_PROVIDER_SITE_OTHER): Payer: Medicare HMO | Admitting: Internal Medicine

## 2022-05-17 ENCOUNTER — Encounter: Payer: Self-pay | Admitting: Internal Medicine

## 2022-05-17 VITALS — BP 146/84 | HR 65 | Ht 62.0 in | Wt 165.0 lb

## 2022-05-17 DIAGNOSIS — E118 Type 2 diabetes mellitus with unspecified complications: Secondary | ICD-10-CM | POA: Diagnosis not present

## 2022-05-17 DIAGNOSIS — I10 Essential (primary) hypertension: Secondary | ICD-10-CM

## 2022-05-17 LAB — POCT GLYCOSYLATED HEMOGLOBIN (HGB A1C): Hemoglobin A1C: 7 % — AB (ref 4.0–5.6)

## 2022-05-17 MED ORDER — CARVEDILOL 12.5 MG PO TABS: 12.5000 mg | ORAL_TABLET | Freq: Two times a day (BID) | ORAL | 1 refills | Status: DC

## 2022-05-17 NOTE — Progress Notes (Signed)
Date:  05/17/2022   Name:  Samantha Lang   DOB:  [birth year]   MRN:  [id number]   Chief Complaint: Diabetes  Hypertension This is a chronic problem. The problem is controlled (she takes her medication but does not check bp). Pertinent negatives include no chest pain, headaches, palpitations or shortness of breath. Past treatments include beta blockers, angiotensin blockers, diuretics and calcium channel blockers.  Diabetes She presents for her follow-up diabetic visit. She has type 2 diabetes mellitus. Her disease course has been stable. Pertinent negatives for hypoglycemia include no dizziness or headaches. Pertinent negatives for diabetes include no chest pain, no fatigue and no weakness. Current diabetic treatment includes oral agent (monotherapy) Celesta Gentile). She participates in exercise intermittently. There is no compliance with monitoring of blood glucose. An ACE inhibitor/angiotensin II receptor blocker is being taken. Eye exam is current.   Lab Results  Component Value Date   NA 138 09/14/2021   K 3.5 09/14/2021   CO2 26 09/14/2021   GLUCOSE 186 (H) 09/14/2021   BUN 22 09/14/2021   CREATININE 0.80 09/14/2021   CALCIUM 9.6 09/14/2021   EGFR 75 09/14/2021   GFRNONAA 54 (L) 11/23/2020   Lab Results  Component Value Date   CHOL 159 07/09/2021   HDL 64 07/09/2021   LDLCALC 65 07/09/2021   LDLDIRECT 138.3 09/26/2011   TRIG 181 (H) 07/09/2021   CHOLHDL 2.5 07/09/2021   Lab Results  Component Value Date   TSH 3.190 07/09/2021   Lab Results  Component Value Date   HGBA1C 7.0 (A) 05/17/2022   Lab Results  Component Value Date   WBC 7.4 07/09/2021   HGB 13.3 07/09/2021   HCT 38.5 07/09/2021   MCV 90 07/09/2021   PLT 152 07/09/2021   Lab Results  Component Value Date   ALT 35 (H) 09/14/2021   AST 35 09/14/2021   ALKPHOS 84 09/14/2021   BILITOT 0.5 09/14/2021   No results found for: 25OHVITD2, 25OHVITD3, VD25OH   Review of Systems  Constitutional:  Negative  for fatigue and unexpected weight change.  HENT:  Negative for nosebleeds.   Eyes:  Negative for visual disturbance.  Respiratory:  Negative for cough, chest tightness, shortness of breath and wheezing.   Cardiovascular:  Negative for chest pain, palpitations and leg swelling.  Gastrointestinal:  Negative for abdominal pain, constipation and diarrhea.  Neurological:  Negative for dizziness, weakness, light-headedness and headaches.   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   LVH (left ventricular hypertrophy) due to hypertensive disease, without heart failure 04/11/2019   Foot pain, right 10/10/2018   Ganglion cyst of left foot 08/29/2018   Xerosis of skin 08/29/2018   Palpitations 08/08/2018   Colon cancer screening    Trochanteric bursitis of left hip 12/07/2016   History of endometrial cancer 08/03/2016   Edema leg 04/13/2016   Type II diabetes mellitus with complication ([place]) 25/36/6440   Shoulder pain, left 02/12/2016   Acid reflux 10/02/2015   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   Hyperlipidemia associated with type 2 diabetes mellitus ([place]) 04/08/2015   Aortic heart valve narrowing 03/26/2015   Bilateral carotid artery stenosis 03/26/2015   Anxiety  Environmental and seasonal allergies    Postmenopausal atrophic vaginitis    Depression, major, recurrent, moderate (HCC)    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 Rosaria Ferries, eyelid lift   POLYPECTOMY  09/22/2017   Procedure: POLYPECTOMY;  Surgeon: Lucilla Lame, MD;  Location: [facility];  Service: Gastroenterology;;   TUBAL LIGATION      Social History   Tobacco Use   Smoking status: Former    Packs/day: 0.25    Years: 20.00    Pack years: 5.00    Types: Cigarettes    Quit date: 09/25/1988    Years since quitting: 33.6   Smokeless tobacco: Never  Vaping Use   Vaping Use: Never used  Substance Use Topics   Alcohol use: Yes    Alcohol/week: 6.0 standard drinks    Types: 2 Glasses of wine, 2 Cans of beer, 2 Shots of liquor per week   Drug use: Never     Medication list has been reviewed and updated.  Current Meds  Medication Sig   allopurinol (ZYLOPRIM) 100 MG tablet Take 1 tablet (100 mg total) by mouth daily.   amLODipine (NORVASC) 5 MG tablet Take 1 tablet (5 mg total) by mouth 2 (two) times daily.   aspirin 81 MG tablet Take 81 mg by mouth daily.   atorvastatin (LIPITOR) 10 MG tablet Take 1 tablet (10 mg total) by mouth daily.   diclofenac (VOLTAREN) 50 MG EC tablet Take 1 tablet (50 mg total) by mouth 2 (two) times daily as needed.   Multiple Vitamin (MULTIVITAMIN) capsule Take 1 capsule by mouth daily.   ONETOUCH ULTRA test strip TEST TWICE DAILY   pantoprazole (PROTONIX) 40 MG tablet Take 1 tablet (40 mg total) by mouth daily.   psyllium (METAMUCIL SMOOTH TEXTURE) 58.6 % powder Please use one does every other day.   sitaGLIPtin (JANUVIA) 100 MG tablet Take 1 tablet (100 mg total) by mouth daily.   telmisartan-hydrochlorothiazide (MICARDIS HCT) 80-25 MG tablet Take 1 tablet by mouth daily.   venlafaxine XR (EFFEXOR-XR) 150 MG 24 hr capsule TAKE (1) CAPSULE BY  MOUTH EVERY DAY   VITAMIN D PO Take 5,000 Units by mouth daily.   [DISCONTINUED] carvedilol (COREG) 6.25 MG tablet Take 1 tablet (6.25 mg total) by mouth 2 (two) times daily.   [DISCONTINUED] colchicine 0.6 MG tablet Take 0.6 mg by mouth daily. PRN only, takes rarely   [DISCONTINUED] guaiFENesin-codeine (ROBITUSSIN AC) 100-10 MG/5ML syrup Take 5 mLs by mouth 3 (three) times daily as needed for cough.       05/17/2022   10:35 AM 03/25/2022   10:31 AM 01/14/2022   10:34 AM 09/16/2021    1:36 PM  GAD 7 : Generalized Anxiety Score  Nervous, Anxious, on Edge 1 1 1 2   Control/stop worrying 1 1 0 2  Worry too much - different things 1 1 0 2  Trouble relaxing 1 1 0 2  Restless 1 1  0 2  Easily annoyed or irritable 1 1 0 2  Afraid - awful might happen 1 1 1 2   Total GAD 7 Score 7 7 2 14   Anxiety Difficulty Not difficult at all Somewhat difficult Somewhat difficult Very difficult       05/17/2022   10:35 AM  Depression screen PHQ 2/9  Decreased Interest 1  Down, Depressed, Hopeless 3  PHQ - 2 Score 4  Altered sleeping 3  Tired, decreased energy 2  Change in appetite 0  Feeling bad or failure about yourself  0  Trouble concentrating 1  Moving slowly or fidgety/restless 1  Suicidal thoughts 0  PHQ-9 Score 11  Difficult doing work/chores Somewhat difficult    BP Readings from Last 3 Encounters:  05/17/22 (!) 146/84  03/25/22 (!) 140/48  02/17/22 (!) 141/68    Physical Exam Vitals and nursing note reviewed.  Constitutional:      General: She is not in acute distress.    Appearance: She is well-developed.  HENT:     Head: Normocephalic and atraumatic.  Cardiovascular:     Rate and Rhythm: Normal rate and regular rhythm.     Pulses: Normal pulses.  Pulmonary:     Effort: Pulmonary effort is normal. No respiratory distress.     Breath sounds: No wheezing or rhonchi.  Musculoskeletal:        General: Normal range of motion.     Right lower leg: No edema.     Left lower leg:  No edema.  Skin:    General: Skin is warm and dry.     Capillary Refill: Capillary refill takes less than 2 seconds.     Findings: No rash.  Neurological:     Mental Status: She is alert and oriented to person, place, and time.  Psychiatric:        Mood and Affect: Mood normal.        Behavior: Behavior normal.    Wt Readings from Last 3 Encounters:  05/17/22 165 lb (74.8 kg)  03/25/22 163 lb (73.9 kg)  02/17/22 172 lb (78 kg)    BP (!) 146/84 (BP Location: Right Arm, Cuff Size: Large)   Pulse 65   Ht 5' 2"  (1.575 m)   Wt 165 lb (74.8 kg)   SpO2 98%   BMI 30.18 kg/m   Assessment and Plan: 1. Type II diabetes mellitus with complication (HCC) Clinically stable by exam and report without s/s of hypoglycemia on Januvia DM complicated by hypertension and dyslipidemia. Tolerating medications well without side effects or other concerns. - POCT glycosylated hemoglobin (Hb A1C)= 7.0  2. Essential hypertension BP not controlled on 4 medications. Will increase Coreg to 12.5 mg bid Recheck in 6 weeks - carvedilol (COREG) 12.5 MG tablet; Take 1 tablet (12.5 mg total) by mouth 2 (two) times daily.  Dispense: 180 tablet; Refill: 1   Partially dictated using Editor, commissioning. Any errors are unintentional.  Halina Maidens, MD [facility]  05/17/2022

## 2022-05-17 NOTE — Patient Instructions (Signed)
Double Carvedilol to 12.5 mg twice a day until you get the new Rx

## 2022-05-31 ENCOUNTER — Ambulatory Visit
Admission: RE | Admit: 2022-05-31 | Discharge: 2022-05-31 | Disposition: A | Discharge status: Home / Self Care | Payer: Medicare HMO | Source: Ambulatory Visit | Attending: Internal Medicine | Admitting: Internal Medicine

## 2022-05-31 DIAGNOSIS — Z1231 Encounter for screening mammogram for malignant neoplasm of breast: Secondary | ICD-10-CM | POA: Diagnosis not present

## 2022-06-03 ENCOUNTER — Other Ambulatory Visit: Payer: Self-pay | Admitting: Internal Medicine

## 2022-06-03 DIAGNOSIS — M10079 Idiopathic gout, unspecified ankle and foot: Secondary | ICD-10-CM

## 2022-06-03 NOTE — Telephone Encounter (Signed)
Requested Prescriptions  Pending Prescriptions Disp Refills  . allopurinol (ZYLOPRIM) 100 MG tablet [Pharmacy Med Name: ALLOPURINOL 100 MG TAB] 90 tablet 0    Sig: TAKE (1) [place] DAY     Endocrinology:  Gout Agents - allopurinol Failed - 06/03/2022 12:17 PM      Failed - Uric Acid in normal range and within 360 days    Uric Acid  Date Value Ref Range Status  10/12/2018 7.0 2.5 - 7.1 mg/dL Final    Comment:               Therapeutic target for gout patients: <6.0         Passed - Cr in normal range and within 360 days    Creatinine, Ser  Date Value Ref Range Status  09/14/2021 0.80 0.57 - 1.00 mg/dL Final         Passed - Valid encounter within last 12 months    Recent Outpatient Visits          2 weeks ago Type II diabetes mellitus with complication [facility])   [facility] Glean Hess, MD   2 months ago Other cough   [facility] Glean Hess, MD   4 months ago Type II diabetes mellitus with complication [facility])   [facility] Glean Hess, MD   8 months ago Right rotator cuff tear arthropathy   [facility] Montel Culver, MD   8 months ago Annual physical exam   [facility] Glean Hess, MD      Future Appointments            In 2 weeks Army Melia Jesse Sans, MD [facility], [place]   In 2 months Glean Hess, MD [facility], [place] within normal limits and completed in the last 12 months    WBC  Date Value Ref Range Status  07/09/2021 7.4 3.4 - 10.8 x10E3/uL Final   RBC  Date Value Ref Range Status  07/09/2021 4.30 3.77 - 5.28 x10E6/uL Final   Hemoglobin  Date Value Ref Range Status  07/09/2021 13.3 11.1 - 15.9 g/dL Final   Hematocrit  Date Value Ref Range Status  07/09/2021 38.5 34.0 - 46.6 % Final   MCHC  Date Value Ref Range Status  07/09/2021 34.5 31.5 - 35.7 g/dL Final   [facility]  Date Value Ref Range Status  07/09/2021 30.9 26.6 -  33.0 pg Final   MCV  Date Value Ref Range Status  07/09/2021 90 79 - 97 fL Final   No results found for: "PLTCOUNTKUC", "LABPLAT", "POCPLA" RDW  Date Value Ref Range Status  07/09/2021 12.9 11.7 - 15.4 % Final

## 2022-06-17 ENCOUNTER — Other Ambulatory Visit: Payer: Self-pay | Admitting: Internal Medicine

## 2022-06-17 MED ORDER — TELMISARTAN-HCTZ 80-25 MG PO TABS: 1.0000 | ORAL_TABLET | Freq: Every day | ORAL | 1 refills | Status: DC

## 2022-06-20 ENCOUNTER — Ambulatory Visit: Payer: Medicare HMO | Admitting: Internal Medicine

## 2022-06-20 NOTE — Progress Notes (Deleted)
Date:  06/20/2022   Name:  Samantha Lang   DOB:  1941/11/26   MRN:  [id number]   Chief Complaint: No chief complaint on file.  Hypertension This is a chronic problem. The problem is uncontrolled. Pertinent negatives include no chest pain, headaches, palpitations or shortness of breath. Past treatments include calcium channel blockers, angiotensin blockers, diuretics and beta blockers (coreg dose increased 6 weeks ago). Hypertensive end-organ damage includes CAD/MI. There is no history of kidney disease or CVA.    Lab Results  Component Value Date   NA 138 09/14/2021   K 3.5 09/14/2021   CO2 26 09/14/2021   GLUCOSE 186 (H) 09/14/2021   BUN 22 09/14/2021   CREATININE 0.80 09/14/2021   CALCIUM 9.6 09/14/2021   EGFR 75 09/14/2021   GFRNONAA 54 (L) 11/23/2020   Lab Results  Component Value Date   CHOL 159 07/09/2021   HDL 64 07/09/2021   LDLCALC 65 07/09/2021   LDLDIRECT 138.3 09/26/2011   TRIG 181 (H) 07/09/2021   CHOLHDL 2.5 07/09/2021   Lab Results  Component Value Date   TSH 3.190 07/09/2021   Lab Results  Component Value Date   HGBA1C 7.0 (A) 05/17/2022   Lab Results  Component Value Date   WBC 7.4 07/09/2021   HGB 13.3 07/09/2021   HCT 38.5 07/09/2021   MCV 90 07/09/2021   PLT 152 07/09/2021   Lab Results  Component Value Date   ALT 35 (H) 09/14/2021   AST 35 09/14/2021   ALKPHOS 84 09/14/2021   BILITOT 0.5 09/14/2021   No results found for: "25OHVITD2", "25OHVITD3", "VD25OH"   Review of Systems  Constitutional:  Negative for fatigue and unexpected weight change.  HENT:  Negative for nosebleeds.   Eyes:  Negative for visual disturbance.  Respiratory:  Negative for cough, chest tightness, shortness of breath and wheezing.   Cardiovascular:  Negative for chest pain, palpitations and leg swelling.  Gastrointestinal:  Negative for abdominal pain, constipation and diarrhea.  Neurological:  Negative for dizziness, weakness, light-headedness and  headaches.    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   LVH (left ventricular hypertrophy) due to hypertensive disease, without heart failure 04/11/2019   Foot pain, right 10/10/2018   Ganglion cyst of left foot 08/29/2018   Xerosis of skin 08/29/2018   Palpitations 08/08/2018   Colon cancer screening    Trochanteric bursitis of left hip 12/07/2016   History of endometrial cancer 08/03/2016   Edema leg 04/13/2016   Type II diabetes mellitus with complication ([place]) 40/98/1191   Shoulder pain, left 02/12/2016   Acid reflux 10/02/2015   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   Hyperlipidemia associated with type 2 diabetes mellitus ([place]) 04/08/2015   Aortic heart valve narrowing 03/26/2015   Bilateral carotid artery stenosis 03/26/2015   Anxiety    Environmental and seasonal allergies    Postmenopausal atrophic vaginitis    Depression, major, recurrent, moderate (HCC)    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 Rosaria Ferries, eyelid lift   POLYPECTOMY  09/22/2017   Procedure: POLYPECTOMY;  Surgeon: Lucilla Lame, MD;  Location: [place];  Service: Gastroenterology;;    TUBAL LIGATION      Social History   Tobacco Use   Smoking status: Former    Packs/day: 0.25    Years: 20.00    Total pack years: 5.00    Types: Cigarettes    Quit date: 09/25/1988    Years since quitting: 33.7   Smokeless tobacco: Never  Vaping Use   Vaping Use: Never used  Substance Use Topics   Alcohol use: Yes    Alcohol/week: 6.0 standard drinks of alcohol    Types: 2 Glasses of wine, 2 Cans of beer, 2 Shots of liquor per week   Drug use: Never     Medication list has been reviewed and updated.  No outpatient medications have been marked as taking for the 06/20/22 encounter (Appointment) with Glean Hess, MD.       05/17/2022   10:35 AM 03/25/2022   10:31 AM 01/14/2022   10:34 AM 09/16/2021    1:36 PM  GAD 7 : Generalized Anxiety Score  Nervous, Anxious, on Edge 1 1 1 2   Control/stop worrying 1 1 0 2  Worry too much - different things 1 1 0 2  Trouble relaxing 1 1 0 2  Restless 1 1 0 2  Easily annoyed or irritable 1 1 0 2  Afraid - awful might happen 1 1 1 2   Total GAD 7 Score 7 7 2 14   Anxiety Difficulty Not difficult at all Somewhat difficult Somewhat difficult Very difficult       05/17/2022   10:35 AM  Depression screen PHQ 2/9  Decreased Interest 1  Down, Depressed, Hopeless 3  PHQ - 2 Score 4  Altered sleeping 3  Tired, decreased energy 2  Change in appetite 0  Feeling bad or failure about yourself  0  Trouble concentrating 1  Moving slowly or fidgety/restless 1  Suicidal thoughts 0  PHQ-9 Score 11  Difficult doing work/chores Somewhat difficult    BP Readings from Last 3 Encounters:  05/17/22 (!) 146/84  03/25/22 (!) 140/48  02/17/22 (!) 141/68    Physical Exam Vitals and nursing note reviewed.  Constitutional:      General: She is not in acute distress.    Appearance: She is well-developed.  HENT:     Head: Normocephalic and atraumatic.  Pulmonary:     Effort: Pulmonary effort is normal. No respiratory distress.  Skin:     General: Skin is warm and dry.     Findings: No rash.  Neurological:     Mental Status: She is alert and oriented to person, place, and time.  Psychiatric:        Mood and Affect: Mood normal.        Behavior: Behavior normal.     Wt Readings from Last 3 Encounters:  05/17/22 165 lb (74.8 kg)  03/25/22 163 lb (73.9 kg)  02/17/22 172 lb (78 kg)    There were no vitals taken for this visit.  Assessment and  Plan:

## 2022-06-23 ENCOUNTER — Encounter: Payer: Self-pay | Admitting: Internal Medicine

## 2022-06-23 ENCOUNTER — Ambulatory Visit (INDEPENDENT_AMBULATORY_CARE_PROVIDER_SITE_OTHER): Payer: Medicare HMO | Admitting: Internal Medicine

## 2022-06-23 VITALS — BP 134/62 | HR 88 | Ht 62.0 in | Wt 168.0 lb

## 2022-06-23 DIAGNOSIS — I10 Essential (primary) hypertension: Secondary | ICD-10-CM | POA: Diagnosis not present

## 2022-06-23 NOTE — Progress Notes (Signed)
Date:  06/23/2022   Name:  Samantha Lang   DOB:  May 18, 1941   MRN:  [id number]   Chief Complaint: Hypertension  Hypertension This is a chronic problem. The problem is resistant. Pertinent negatives include no chest pain, headaches, palpitations or shortness of breath. Past treatments include beta blockers, angiotensin blockers, calcium channel blockers and diuretics (increased dose of Coreg last visit). The current treatment provides moderate improvement. There are no compliance problems.  Hypertensive end-organ damage includes kidney disease. There is no history of CAD/MI or CVA.    Lab Results  Component Value Date   NA 138 09/14/2021   K 3.5 09/14/2021   CO2 26 09/14/2021   GLUCOSE 186 (H) 09/14/2021   BUN 22 09/14/2021   CREATININE 0.80 09/14/2021   CALCIUM 9.6 09/14/2021   EGFR 75 09/14/2021   GFRNONAA 54 (L) 11/23/2020   Lab Results  Component Value Date   CHOL 159 07/09/2021   HDL 64 07/09/2021   LDLCALC 65 07/09/2021   LDLDIRECT 138.3 09/26/2011   TRIG 181 (H) 07/09/2021   CHOLHDL 2.5 07/09/2021   Lab Results  Component Value Date   TSH 3.190 07/09/2021   Lab Results  Component Value Date   HGBA1C 7.0 (A) 05/17/2022   Lab Results  Component Value Date   WBC 7.4 07/09/2021   HGB 13.3 07/09/2021   HCT 38.5 07/09/2021   MCV 90 07/09/2021   PLT 152 07/09/2021   Lab Results  Component Value Date   ALT 35 (H) 09/14/2021   AST 35 09/14/2021   ALKPHOS 84 09/14/2021   BILITOT 0.5 09/14/2021   No results found for: "25OHVITD2", "25OHVITD3", "VD25OH"   Review of Systems  Constitutional:  Negative for fatigue and unexpected weight change.  HENT:  Negative for nosebleeds.   Eyes:  Negative for visual disturbance.  Respiratory:  Negative for cough, chest tightness, shortness of breath and wheezing.   Cardiovascular:  Negative for chest pain, palpitations and leg swelling.  Gastrointestinal:  Negative for abdominal pain, constipation and diarrhea.   Neurological:  Negative for dizziness, weakness, light-headedness and headaches.    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   LVH (left ventricular hypertrophy) due to hypertensive disease, without heart failure 04/11/2019   Foot pain, right 10/10/2018   Ganglion cyst of left foot 08/29/2018   Xerosis of skin 08/29/2018   Palpitations 08/08/2018   Colon cancer screening    Trochanteric bursitis of left hip 12/07/2016   History of endometrial cancer 08/03/2016   Edema leg 04/13/2016   Type II diabetes mellitus with complication ([place]) 41/55/1614   Shoulder pain, left 02/12/2016   Acid reflux 10/02/2015   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   Hyperlipidemia associated with type 2 diabetes mellitus ([place]) 04/08/2015   Aortic heart valve narrowing 03/26/2015   Bilateral carotid artery stenosis 03/26/2015   Anxiety    Environmental and seasonal allergies    Postmenopausal atrophic vaginitis    Depression, major, recurrent, moderate (HCC)    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 Rosaria Ferries, eyelid lift   POLYPECTOMY   09/22/2017   Procedure: POLYPECTOMY;  Surgeon: Lucilla Lame, MD;  Location: [place];  Service: Gastroenterology;;   TUBAL LIGATION      Social History   Tobacco Use   Smoking status: Former    Packs/day: 0.25    Years: 20.00    Total pack years: 5.00    Types: Cigarettes    Quit date: 09/25/1988    Years since quitting: 33.7   Smokeless tobacco: Never  Vaping Use   Vaping Use: Never used  Substance Use Topics   Alcohol use: Yes    Alcohol/week: 6.0 standard drinks of alcohol    Types: 2 Glasses of wine, 2 Cans of beer, 2 Shots of liquor per week   Drug use: Never     Medication list has been reviewed and updated.  Current Meds  Medication Sig   allopurinol (ZYLOPRIM) 100 MG tablet TAKE (1) TABLET BY MOUTH EVERY DAY   amLODipine (NORVASC) 5 MG tablet Take 1 tablet (5 mg total) by mouth 2 (two) times daily.   aspirin 81 MG tablet Take 81 mg by mouth daily.   atorvastatin (LIPITOR) 10 MG tablet Take 1 tablet (10 mg total) by mouth daily.   carvedilol (COREG) 12.5 MG tablet Take 1 tablet (12.5 mg total) by mouth 2 (two) times daily.   diclofenac (VOLTAREN) 50 MG EC tablet Take 1 tablet (50 mg total) by mouth 2 (two) times daily as needed.   methocarbamol (ROBAXIN) 500 MG tablet as needed.   Multiple Vitamin (MULTIVITAMIN) capsule Take 1 capsule by mouth daily.   ONETOUCH ULTRA test strip TEST TWICE DAILY   pantoprazole (PROTONIX) 40 MG tablet Take 1 tablet (40 mg total) by mouth daily.   psyllium (METAMUCIL SMOOTH TEXTURE) 58.6 % powder Please use one does every other day.   sitaGLIPtin (JANUVIA) 100 MG tablet Take 1 tablet (100 mg total) by mouth daily.   telmisartan-hydrochlorothiazide (MICARDIS HCT) 80-25 MG tablet Take 1 tablet by mouth daily.   venlafaxine XR (EFFEXOR-XR) 150 MG 24 hr capsule TAKE (1) CAPSULE BY MOUTH EVERY DAY   VITAMIN D PO Take 5,000 Units by mouth daily.       06/23/2022   11:05 AM 05/17/2022   10:35 AM 03/25/2022   10:31 AM 01/14/2022    10:34 AM  GAD 7 : Generalized Anxiety Score  Nervous, Anxious, on Edge _0 Control/stop worrying _1 0  Worry too much - different things _2 0  Trouble relaxing _3 0  Restless _4 0  Easily annoyed or irritable _5 0  Afraid - awful might happen _6 Total GAD 7 Score _7 Anxiety Difficulty Somewhat difficult Not difficult at all Somewhat difficult Somewhat difficult       06/23/2022   11:05 AM 05/17/2022   10:35 AM 03/25/2022   10:31 AM  Depression screen PHQ 2/9  Decreased Interest  _0 Down, Depressed, Hopeless _1 PHQ - 2 Score _2 Altered sleeping _3 Tired, decreased energy _4 Change in appetite 1 0 3  Feeling bad or failure about yourself  1 0 0  Trouble concentrating _5 Moving slowly or fidgety/restless _6 Suicidal thoughts 1 0 0  PHQ-9 Score _7 Difficult doing work/chores Somewhat difficult Somewhat difficult Not difficult at all    BP Readings from Last 3 Encounters:  06/23/22 134/62  05/17/22 (!) 146/84  03/25/22 (!) 140/48    Physical Exam Vitals and nursing note reviewed.  Constitutional:      General: She is not in acute distress.    Appearance: Normal appearance. She is well-developed.  HENT:     Head: Normocephalic and atraumatic.  Cardiovascular:     Rate and Rhythm: Normal rate and regular rhythm.  Pulmonary:     Effort: Pulmonary effort is normal. No respiratory distress.     Breath sounds: No wheezing or rhonchi.  Musculoskeletal:        General: Swelling (mild soft tissue swelling around the ankles) present.     Cervical back: Normal range of motion.     Right lower leg: No edema.     Left lower leg: No edema.  Lymphadenopathy:     Cervical: No cervical adenopathy.  Skin:    General: Skin is warm and dry.     Findings: No rash.  Neurological:     General: No focal deficit present.     Mental Status: She is alert and oriented to person, place, and time.  Psychiatric:        Mood  and Affect: Mood normal.        Behavior: Behavior normal.     Wt Readings from Last 3 Encounters:  06/23/22 168 lb (76.2 kg)  05/17/22 165 lb (74.8 kg)  03/25/22 163 lb (73.9 kg)    BP 134/62 (BP Location: Right Arm, Cuff Size: Normal)   Pulse 88   Ht 5' 2" (1.575 m)   Wt 168 lb (76.2 kg)   SpO2 95%   BMI 30.73 kg/m   Assessment and Plan: 1. Essential hypertension Clinically improved exam with well controlled BP after Coreg dose change. Tolerating medications without side effects at this time. Pt to continue current regimen and low sodium diet; benefits of regular exercise as able discussed.   Partially dictated using Editor, commissioning. Any errors are unintentional.  Halina Maidens, MD [facility]  06/23/2022

## 2022-07-04 DIAGNOSIS — E119 Type 2 diabetes mellitus without complications: Secondary | ICD-10-CM | POA: Diagnosis not present

## 2022-07-04 DIAGNOSIS — Z961 Presence of intraocular lens: Secondary | ICD-10-CM | POA: Diagnosis not present

## 2022-07-04 LAB — HM DIABETES EYE EXAM

## 2022-07-12 ENCOUNTER — Ambulatory Visit (INDEPENDENT_AMBULATORY_CARE_PROVIDER_SITE_OTHER): Payer: Medicare HMO | Admitting: Internal Medicine

## 2022-07-12 ENCOUNTER — Encounter: Payer: Self-pay | Admitting: Internal Medicine

## 2022-07-12 VITALS — BP 152/64 | HR 69 | Ht 62.0 in | Wt 167.0 lb

## 2022-07-12 DIAGNOSIS — T148XXA Other injury of unspecified body region, initial encounter: Secondary | ICD-10-CM

## 2022-07-12 DIAGNOSIS — I10 Essential (primary) hypertension: Secondary | ICD-10-CM

## 2022-07-12 NOTE — Progress Notes (Signed)
Date:  07/12/2022   Name:  Samantha Lang   DOB:  [birth year]   MRN:  [id number]   Chief Complaint: Bleeding/Bruising (Worsening on left arm.)  Hypertension This is a chronic problem. The problem has been waxing and waning since onset. Pertinent negatives include no chest pain, headaches, palpitations or shortness of breath. There are no associated agents to hypertension. Past treatments include angiotensin blockers and diuretics.  Bruising -  having more lesions on both arms, non tender but recurrent.  She is taking baby ASA 81 mg daily and a multivitamin.   Lab Results  Component Value Date   NA 138 09/14/2021   K 3.5 09/14/2021   CO2 26 09/14/2021   GLUCOSE 186 (H) 09/14/2021   BUN 22 09/14/2021   CREATININE 0.80 09/14/2021   CALCIUM 9.6 09/14/2021   EGFR 75 09/14/2021   GFRNONAA 54 (L) 11/23/2020   Lab Results  Component Value Date   CHOL 159 07/09/2021   HDL 64 07/09/2021   LDLCALC 65 07/09/2021   LDLDIRECT 138.3 09/26/2011   TRIG 181 (H) 07/09/2021   CHOLHDL 2.5 07/09/2021   Lab Results  Component Value Date   TSH 3.190 07/09/2021   Lab Results  Component Value Date   HGBA1C 7.0 (A) 05/17/2022   Lab Results  Component Value Date   WBC 7.4 07/09/2021   HGB 13.3 07/09/2021   HCT 38.5 07/09/2021   MCV 90 07/09/2021   PLT 152 07/09/2021   Lab Results  Component Value Date   ALT 35 (H) 09/14/2021   AST 35 09/14/2021   ALKPHOS 84 09/14/2021   BILITOT 0.5 09/14/2021   No results found for: "25OHVITD2", "25OHVITD3", "VD25OH"   Review of Systems  Constitutional:  Negative for fatigue, fever and unexpected weight change.  HENT:  Negative for nosebleeds.   Eyes:  Negative for visual disturbance.  Respiratory:  Negative for cough, chest tightness, shortness of breath and wheezing.   Cardiovascular:  Negative for chest pain, palpitations and leg swelling.  Gastrointestinal:  Negative for abdominal pain, constipation and diarrhea.  Neurological:  Negative  for dizziness, weakness, light-headedness and headaches.    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   LVH (left ventricular hypertrophy) due to hypertensive disease, without heart failure 04/11/2019   Foot pain, right 10/10/2018   Ganglion cyst of left foot 08/29/2018   Xerosis of skin 08/29/2018   Palpitations 08/08/2018   Colon cancer screening    Trochanteric bursitis of left hip 12/07/2016   History of endometrial cancer 08/03/2016   Edema leg 04/13/2016   Type II diabetes mellitus with complication ([place]) 56/25/6389   Shoulder pain, left 02/12/2016   Acid reflux 10/02/2015   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   Hyperlipidemia associated with type 2 diabetes mellitus ([place]) 04/08/2015   Aortic heart valve narrowing 03/26/2015   Bilateral carotid artery stenosis 03/26/2015   Anxiety    Environmental and seasonal allergies    Postmenopausal atrophic vaginitis    Depression, major, recurrent, moderate (HCC)    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 Rosaria Ferries, eyelid lift   POLYPECTOMY  09/22/2017   Procedure:  POLYPECTOMY;  Surgeon: Lucilla Lame, MD;  Location: [place];  Service: Gastroenterology;;   TUBAL LIGATION      Social History   Tobacco Use   Smoking status: Former    Packs/day: 0.25    Years: 20.00    Total pack years: 5.00    Types: Cigarettes    Quit date: 09/25/1988    Years since quitting: 33.8   Smokeless tobacco: Never  Vaping Use   Vaping Use: Never used  Substance Use Topics   Alcohol use: Yes    Alcohol/week: 6.0 standard drinks of alcohol    Types: 2 Glasses of wine, 2 Cans of beer, 2 Shots of liquor per week   Drug use: Never     Medication list has been reviewed and updated.  Current Meds  Medication Sig   allopurinol (ZYLOPRIM) 100 MG tablet TAKE (1) TABLET BY MOUTH EVERY DAY   amLODipine (NORVASC) 5 MG tablet Take 1 tablet (5 mg total) by mouth 2 (two) times daily.   aspirin 81 MG tablet Take 81 mg by mouth daily.   atorvastatin (LIPITOR) 10 MG tablet Take 1 tablet (10 mg total) by mouth daily.   carvedilol (COREG) 12.5 MG tablet Take 1 tablet (12.5 mg total) by mouth 2 (two) times daily.   diclofenac (VOLTAREN) 50 MG EC tablet Take 1 tablet (50 mg total) by mouth 2 (two) times daily as needed.   methocarbamol (ROBAXIN) 500 MG tablet as needed.   Multiple Vitamin (MULTIVITAMIN) capsule Take 1 capsule by mouth daily.   ONETOUCH ULTRA test strip TEST TWICE DAILY   pantoprazole (PROTONIX) 40 MG tablet Take 1 tablet (40 mg total) by mouth daily.   psyllium (METAMUCIL SMOOTH TEXTURE) 58.6 % powder Please use one does every other day.   sitaGLIPtin (JANUVIA) 100 MG tablet Take 1 tablet (100 mg total) by mouth daily.   telmisartan-hydrochlorothiazide (MICARDIS HCT) 80-25 MG tablet Take 1 tablet by mouth daily.   venlafaxine XR (EFFEXOR-XR) 150 MG 24 hr capsule TAKE (1) CAPSULE BY MOUTH EVERY DAY   VITAMIN D PO Take 5,000 Units by mouth daily.       07/12/2022    8:54 AM 06/23/2022   11:05 AM 05/17/2022   10:35 AM 03/25/2022   10:31 AM  GAD 7 :  Generalized Anxiety Score  Nervous, Anxious, on Edge 1 1 1 1   Control/stop worrying 1 1 1 1   Worry too much - different things 1 1 1 1   Trouble relaxing 1 1 1 1   Restless 1 1 1 1   Easily annoyed or irritable 1 1 1 1   Afraid - awful might happen 1 1 1 1   Total GAD 7 Score 7 7 7 7   Anxiety Difficulty Somewhat difficult Somewhat difficult Not difficult at all Somewhat difficult       07/12/2022    8:54 AM 06/23/2022   11:05 AM 05/17/2022   10:35 AM  Depression  screen PHQ 2/9  Decreased Interest 1 1 1   Down, Depressed, Hopeless 1 1 3   PHQ - 2 Score 2 2 4   Altered sleeping 1 1 3   Tired, decreased energy 1 1 2   Change in appetite 1 1 0  Feeling bad or failure about yourself  1 1 0  Trouble concentrating 1 1 1   Moving slowly or fidgety/restless 1 1 1   Suicidal thoughts 1 1 0  PHQ-9 Score 9 9 11   Difficult doing work/chores Somewhat difficult Somewhat difficult Somewhat difficult    BP Readings from Last 3 Encounters:  07/12/22 (!) 152/64  06/23/22 134/62  05/17/22 (!) 146/84    Physical Exam Vitals and nursing note reviewed.  Constitutional:      General: She is not in acute distress.    Appearance: Normal appearance. She is well-developed.  HENT:     Head: Normocephalic and atraumatic.  Cardiovascular:     Rate and Rhythm: Normal rate and regular rhythm.  Pulmonary:     Effort: Pulmonary effort is normal. No respiratory distress.     Breath sounds: No rhonchi.  Musculoskeletal:     Cervical back: Normal range of motion.  Skin:    General: Skin is warm and dry.     Findings: Bruising (several small petechiae on left forearm - benign appearance) present. No rash.  Neurological:     Mental Status: She is alert and oriented to person, place, and time.  Psychiatric:        Mood and Affect: Mood normal.        Behavior: Behavior normal.     Wt Readings from Last 3 Encounters:  07/12/22 167 lb (75.8 kg)  06/23/22 168 lb (76.2 kg)  05/17/22 165 lb (74.8 kg)    BP  (!) 152/64   Pulse 69   Ht 5' 2"  (1.575 m)   Wt 167 lb (75.8 kg)   SpO2 93%   BMI 30.54 kg/m   Assessment and Plan: 1. Bruising Suspect senile ecchymoses  Patient is reassured but will get CBC Can add Vitamin C 500 mg daily  - CBC with Differential/Platelet  2. Essential hypertension BP elevated today due to worry over the skin lesions Will continue current medications and change at next visit if needed   Partially dictated using [place]. Any errors are unintentional.  Halina Maidens, MD [facility]  07/12/2022

## 2022-07-13 LAB — CBC WITH DIFFERENTIAL/PLATELET
Basophils Absolute: 0.1 10*3/uL (ref 0.0–0.2)
Basos: 1 %
EOS (ABSOLUTE): 0.4 10*3/uL (ref 0.0–0.4)
Eos: 7 %
Hematocrit: 35.2 % (ref 34.0–46.6)
Hemoglobin: 12 g/dL (ref 11.1–15.9)
Immature Grans (Abs): 0 10*3/uL (ref 0.0–0.1)
Immature Granulocytes: 0 %
Lymphocytes Absolute: 1.8 10*3/uL (ref 0.7–3.1)
Lymphs: 33 %
MCH: 31.3 pg (ref 26.6–33.0)
MCHC: 34.1 g/dL (ref 31.5–35.7)
MCV: 92 fL (ref 79–97)
Monocytes Absolute: 0.5 10*3/uL (ref 0.1–0.9)
Monocytes: 9 %
Neutrophils Absolute: 2.8 10*3/uL (ref 1.4–7.0)
Neutrophils: 50 %
RBC: 3.84 x10E6/uL (ref 3.77–5.28)
RDW: 12.1 % (ref 11.7–15.4)
WBC: 5.4 10*3/uL (ref 3.4–10.8)

## 2022-08-31 ENCOUNTER — Encounter: Payer: Self-pay | Admitting: Internal Medicine

## 2022-08-31 ENCOUNTER — Ambulatory Visit (INDEPENDENT_AMBULATORY_CARE_PROVIDER_SITE_OTHER): Payer: Medicare HMO | Admitting: Internal Medicine

## 2022-08-31 VITALS — BP 126/50 | HR 57 | Ht 62.0 in | Wt 168.0 lb

## 2022-08-31 DIAGNOSIS — K219 Gastro-esophageal reflux disease without esophagitis: Secondary | ICD-10-CM

## 2022-08-31 DIAGNOSIS — E1169 Type 2 diabetes mellitus with other specified complication: Secondary | ICD-10-CM | POA: Diagnosis not present

## 2022-08-31 DIAGNOSIS — I10 Essential (primary) hypertension: Secondary | ICD-10-CM | POA: Diagnosis not present

## 2022-08-31 DIAGNOSIS — Z0000 Encounter for general adult medical examination without abnormal findings: Secondary | ICD-10-CM

## 2022-08-31 DIAGNOSIS — F331 Major depressive disorder, recurrent, moderate: Secondary | ICD-10-CM | POA: Diagnosis not present

## 2022-08-31 DIAGNOSIS — E118 Type 2 diabetes mellitus with unspecified complications: Secondary | ICD-10-CM | POA: Diagnosis not present

## 2022-08-31 DIAGNOSIS — E785 Hyperlipidemia, unspecified: Secondary | ICD-10-CM | POA: Diagnosis not present

## 2022-08-31 DIAGNOSIS — R69 Illness, unspecified: Secondary | ICD-10-CM | POA: Diagnosis not present

## 2022-08-31 MED ORDER — AMLODIPINE BESYLATE 5 MG PO TABS: 5.0000 mg | ORAL_TABLET | Freq: Two times a day (BID) | ORAL | 1 refills | Status: DC

## 2022-08-31 MED ORDER — ATORVASTATIN CALCIUM 10 MG PO TABS: 10.0000 mg | ORAL_TABLET | Freq: Every day | ORAL | 1 refills | Status: DC

## 2022-08-31 MED ORDER — PANTOPRAZOLE SODIUM 40 MG PO TBEC: 40.0000 mg | DELAYED_RELEASE_TABLET | Freq: Every day | ORAL | 1 refills | Status: DC

## 2022-08-31 MED ORDER — VENLAFAXINE HCL ER 150 MG PO CP24: 150.0000 mg | ORAL_CAPSULE | Freq: Every day | ORAL | 1 refills | Status: DC

## 2022-08-31 NOTE — Progress Notes (Signed)
Date:  08/31/2022   Name:  Samantha Lang   DOB:  1940-12-29   MRN:  [id number]   Chief Complaint: Annual Exam (Breast exam no pap ) Samantha Lang is a 81 y.o. female who presents today for her Complete Annual Exam. She feels well. She reports exercising none. She reports she is sleeping poorly. Breast complaints both nipples itch- right one more than left.  Mammogram: 05/2022 DEXA: 02/2020 Normal Colonoscopy: 2018  There are no preventive care reminders to display for this patient.   Immunization History  Administered Date(s) Administered   Fluad Quad(high Dose 65+) 10/09/2019   Influenza, High Dose Seasonal PF 11/12/2018   Influenza,inj,Quad PF,6+ Mos 10/02/2015, 11/07/2016, 11/01/2017   Pneumococcal Conjugate-13 02/05/2014   Pneumococcal Polysaccharide-23 06/09/2008   Tdap 06/09/2010, 10/28/2020    Hypertension This is a chronic problem. The problem is controlled (130/60). Pertinent negatives include no chest pain, headaches, palpitations or shortness of breath. Past treatments include angiotensin blockers, diuretics, calcium channel blockers and beta blockers.  Hyperlipidemia This is a chronic problem. The problem is controlled. Pertinent negatives include no chest pain or shortness of breath. Current antihyperlipidemic treatment includes statins. The current treatment provides significant improvement of lipids.  Depression        This is a chronic problem.The problem is unchanged.  Associated symptoms include no fatigue and no headaches.  Past treatments include SNRIs - Serotonin and norepinephrine reuptake inhibitors.  Compliance with treatment is good. Diabetes She presents for her follow-up diabetic visit. She has type 2 diabetes mellitus. Her disease course has been stable. Pertinent negatives for hypoglycemia include no dizziness, headaches, nervousness/anxiousness or tremors. Pertinent negatives for diabetes include no chest pain, no fatigue, no polydipsia and no  polyuria. Current diabetic treatment includes oral agent (monotherapy) Celesta Gentile).    Lab Results  Component Value Date   NA 138 09/14/2021   K 3.5 09/14/2021   CO2 26 09/14/2021   GLUCOSE 186 (H) 09/14/2021   BUN 22 09/14/2021   CREATININE 0.80 09/14/2021   CALCIUM 9.6 09/14/2021   EGFR 75 09/14/2021   GFRNONAA 54 (L) 11/23/2020   Lab Results  Component Value Date   CHOL 159 07/09/2021   HDL 64 07/09/2021   LDLCALC 65 07/09/2021   LDLDIRECT 138.3 09/26/2011   TRIG 181 (H) 07/09/2021   CHOLHDL 2.5 07/09/2021   Lab Results  Component Value Date   TSH 3.190 07/09/2021   Lab Results  Component Value Date   HGBA1C 7.0 (A) 05/17/2022   Lab Results  Component Value Date   WBC 5.4 07/12/2022   HGB 12.0 07/12/2022   HCT 35.2 07/12/2022   MCV 92 07/12/2022   PLT CANCELED 07/12/2022   Lab Results  Component Value Date   ALT 35 (H) 09/14/2021   AST 35 09/14/2021   ALKPHOS 84 09/14/2021   BILITOT 0.5 09/14/2021   No results found for: "25OHVITD2", "25OHVITD3", "VD25OH"   Review of Systems  Constitutional:  Negative for chills, fatigue and fever.  HENT:  Negative for congestion, hearing loss, tinnitus, trouble swallowing and voice change.   Eyes:  Negative for visual disturbance.  Respiratory:  Negative for cough, chest tightness, shortness of breath and wheezing.   Cardiovascular:  Negative for chest pain, palpitations and leg swelling.  Gastrointestinal:  Positive for constipation. Negative for abdominal pain, diarrhea and vomiting.  Endocrine: Negative for polydipsia and polyuria.  Genitourinary:  Negative for dysuria, frequency, genital sores, vaginal bleeding and vaginal discharge.  Musculoskeletal:  Positive  for arthralgias. Negative for gait problem and joint swelling.  Skin:  Negative for color change and rash.  Neurological:  Negative for dizziness, tremors, light-headedness and headaches.  Hematological:  Negative for adenopathy. Does not bruise/bleed easily.   Psychiatric/Behavioral:  Positive for depression and sleep disturbance. Negative for dysphoric mood. The patient is not nervous/anxious.     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   LVH (left ventricular hypertrophy) due to hypertensive disease, without heart failure 04/11/2019   Foot pain, right 10/10/2018   Ganglion cyst of left foot 08/29/2018   Xerosis of skin 08/29/2018   Palpitations 08/08/2018   Colon cancer screening    Trochanteric bursitis of left hip 12/07/2016   History of endometrial cancer 08/03/2016   Edema leg 04/13/2016   Type II diabetes mellitus with complication ([place]) 68/10/5725   Shoulder pain, left 02/12/2016   Acid reflux 10/02/2015   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   Hyperlipidemia associated with type 2 diabetes mellitus ([place]) 04/08/2015   Aortic heart valve narrowing 03/26/2015   Bilateral carotid artery stenosis 03/26/2015   Anxiety    Environmental and seasonal allergies    Postmenopausal atrophic vaginitis    Depression, major, recurrent, moderate (HCC)    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 Rosaria Ferries, eyelid lift   POLYPECTOMY  09/22/2017   Procedure: POLYPECTOMY;  Surgeon: Lucilla Lame, MD;  Location: [facility];  Service: Gastroenterology;;   TUBAL LIGATION      Social History   Tobacco Use   Smoking status: Former    Packs/day: 0.25    Years: 20.00    Total pack years: 5.00    Types: Cigarettes    Quit date: 09/25/1988    Years since quitting: 33.9   Smokeless tobacco: Never  Vaping Use   Vaping Use: Never used  Substance Use Topics   Alcohol use: Yes    Alcohol/week: 6.0 standard drinks of alcohol    Types: 2 Glasses of wine, 2 Cans of beer, 2 Shots of liquor per week   Drug use: Never     Medication list has been reviewed and updated.  Current Meds  Medication Sig   allopurinol (ZYLOPRIM) 100 MG tablet TAKE (1) TABLET BY MOUTH EVERY DAY   amLODipine (NORVASC) 5 MG tablet Take 1 tablet (5 mg total) by mouth 2 (two) times daily.   aspirin 81 MG tablet Take 81 mg by mouth daily.   atorvastatin (LIPITOR) 10 MG tablet Take 1 tablet (10 mg total) by mouth daily.   carvedilol (COREG) 12.5 MG tablet Take  1 tablet (12.5 mg total) by mouth 2 (two) times daily.   diclofenac (VOLTAREN) 50 MG EC tablet Take 1 tablet (50 mg total) by mouth 2 (two) times daily as needed.   methocarbamol (ROBAXIN) 500 MG tablet as needed.   Multiple Vitamin (MULTIVITAMIN) capsule Take 1 capsule by mouth daily.   pantoprazole (PROTONIX) 40 MG tablet Take 1 tablet (40 mg total) by mouth daily.   psyllium (METAMUCIL SMOOTH TEXTURE) 58.6 % powder Please use one does every other day.   sitaGLIPtin (JANUVIA) 100 MG tablet Take 1 tablet (100 mg total) by mouth daily.   telmisartan-hydrochlorothiazide (MICARDIS HCT) 80-25 MG tablet Take 1 tablet by mouth daily.   venlafaxine XR (EFFEXOR-XR) 150 MG 24 hr capsule TAKE (1) CAPSULE BY MOUTH EVERY DAY   VITAMIN D PO Take 5,000 Units by mouth daily.       08/31/2022   10:59 AM 07/12/2022    8:54 AM 06/23/2022   11:05 AM  05/17/2022   10:35 AM  GAD 7 : Generalized Anxiety Score  Nervous, Anxious, on Edge 1 1 1 1   Control/stop worrying 1 1 1 1   Worry too much - different things 1 1 1 1   Trouble relaxing 1 1 1 1   Restless 1 1 1 1   Easily annoyed or irritable 1 1 1 1   Afraid - awful might happen 2 1 1 1   Total GAD 7 Score 8 7 7 7   Anxiety Difficulty Very difficult Somewhat difficult Somewhat difficult Not difficult at all       08/31/2022   10:59 AM 07/12/2022    8:54 AM 06/23/2022   11:05 AM  Depression screen PHQ 2/9  Decreased Interest 1 1 1   Down, Depressed, Hopeless 1 1 1   PHQ - 2 Score 2 2 2   Altered sleeping 2 1 1   Tired, decreased energy 1 1 1   Change in appetite 1 1 1   Feeling bad or failure about yourself  1 1 1   Trouble concentrating 0 1 1  Moving slowly or fidgety/restless 1 1 1   Suicidal thoughts 0 1 1  PHQ-9 Score 8 9 9   Difficult doing work/chores Not difficult at all Somewhat difficult Somewhat difficult    BP Readings from Last 3 Encounters:  08/31/22 (!) 126/50  07/12/22 (!) 152/64  06/23/22 134/62    Physical Exam Vitals and nursing note reviewed.  Constitutional:      General: She is not in acute distress.    Appearance: She is well-developed.  HENT:     Head: Normocephalic and atraumatic.     Right Ear: Tympanic membrane and ear canal normal.     Left Ear: Tympanic membrane and ear canal normal.     Nose:     Right Sinus: No maxillary sinus tenderness.     Left Sinus: No maxillary sinus tenderness.  Eyes:     General: No scleral icterus.       Right eye: No discharge.        Left eye: No discharge.     Conjunctiva/sclera: Conjunctivae normal.  Neck:     Thyroid: No thyromegaly.     Vascular: No carotid bruit.  Cardiovascular:     Rate and Rhythm: Normal rate and regular rhythm.     Pulses: Normal pulses.     Heart sounds: Normal heart sounds.  Pulmonary:     Effort: Pulmonary effort is normal. No respiratory distress.     Breath sounds: No wheezing.   Chest:  Breasts:  Right: No mass, nipple discharge, skin change or tenderness.     Left: No mass, nipple discharge, skin change or tenderness.  Abdominal:     General: Bowel sounds are normal.     Palpations: Abdomen is soft.     Tenderness: There is no abdominal tenderness.  Musculoskeletal:     Cervical back: Normal range of motion. No erythema.     Right lower leg: No edema.     Left lower leg: No edema.  Lymphadenopathy:     Cervical: No cervical adenopathy.  Skin:    General: Skin is warm and dry.     Findings: No rash.  Neurological:     Mental Status: She is alert and oriented to person, place, and time.     Cranial Nerves: No cranial nerve deficit.     Sensory: No sensory deficit.     Deep Tendon Reflexes: Reflexes are normal and symmetric.  Psychiatric:        Attention and Perception: Attention normal.        Mood and Affect: Mood normal.     Wt Readings from Last 3 Encounters:  08/31/22 168 lb (76.2 kg)  07/12/22 167 lb (75.8 kg)  06/23/22 168 lb (76.2 kg)    BP (!) 126/50 (BP Location: Right Arm, Cuff Size: Normal)   Pulse (!) 57   Ht 5' 2"  (1.575 m)   Wt 168 lb (76.2 kg)   SpO2 97%   BMI 30.73 kg/m   Assessment and Plan: 1. Annual physical exam Normal exam for age. Mild memory issues are likely due to age. Recommend brain exercises. She declines all immunizations - flu, zoster, covid  2. Essential hypertension Clinically stable exam with well controlled BP. Tolerating medications without side effects at this time. Pt to continue current regimen and low sodium diet; benefits of regular exercise as able discussed. - CBC with Differential/Platelet - Comprehensive metabolic panel - Urinalysis, Routine w reflex microscopic - amLODipine (NORVASC) 5 MG tablet; Take 1 tablet (5 mg total) by mouth 2 (two) times daily.  Dispense: 180 tablet; Refill: 1  3. Hyperlipidemia associated with type 2 diabetes mellitus ([place]) Tolerating statin medication  without side effects at this time LDL is at goal of < 70 on current dose Continue same therapy without change at this time. - Lipid panel - atorvastatin (LIPITOR) 10 MG tablet; Take 1 tablet (10 mg total) by mouth daily.  Dispense: 90 tablet; Refill: 1  4. Type II diabetes mellitus with complication (HCC) Clinically stable by exam and report without s/s of hypoglycemia. DM complicated by hypertension and dyslipidemia. Tolerating medications well without side effects or other concerns. - Hemoglobin A1c - Microalbumin / creatinine urine ratio  5. Depression, major, recurrent, moderate (HCC) Clinically stable on current regimen with good control of symptoms, No SI or HI. Will continue current therapy. - TSH - venlafaxine XR (EFFEXOR-XR) 150 MG 24 hr capsule; Take 1 capsule (150 mg total) by mouth daily with breakfast.  Dispense: 90 capsule; Refill: 1  6. Gastroesophageal reflux disease, unspecified whether esophagitis present Symptoms well controlled on daily PPI No red flag signs such as weight loss, n/v, melena Will continue pantoprazole - pantoprazole (PROTONIX) 40 MG tablet; Take 1 tablet (40 mg total) by mouth daily.  Dispense: 90 tablet; Refill: 1   Partially dictated using Editor, commissioning. Any errors are unintentional.  Halina Maidens, MD [facility]  08/31/2022

## 2022-09-02 LAB — COMPREHENSIVE METABOLIC PANEL
ALT: 24 IU/L (ref 0–32)
AST: 24 IU/L (ref 0–40)
Albumin/Globulin Ratio: 1.6 (ref 1.2–2.2)
Albumin: 4.4 g/dL (ref 3.8–4.8)
Alkaline Phosphatase: 85 IU/L (ref 44–121)
BUN/Creatinine Ratio: 23 (ref 12–28)
BUN: 19 mg/dL (ref 8–27)
Bilirubin Total: 0.6 mg/dL (ref 0.0–1.2)
CO2: 25 mmol/L (ref 20–29)
Calcium: 9.5 mg/dL (ref 8.7–10.3)
Chloride: 97 mmol/L (ref 96–106)
Creatinine, Ser: 0.82 mg/dL (ref 0.57–1.00)
Globulin, Total: 2.7 g/dL (ref 1.5–4.5)
Glucose: 146 mg/dL — ABNORMAL HIGH (ref 70–99)
Potassium: 3.6 mmol/L (ref 3.5–5.2)
Sodium: 138 mmol/L (ref 134–144)
Total Protein: 7.1 g/dL (ref 6.0–8.5)
eGFR: 73 mL/min/{1.73_m2} (ref >59)

## 2022-09-02 LAB — URINALYSIS, ROUTINE W REFLEX MICROSCOPIC
Bilirubin, UA: NEGATIVE
Glucose, UA: NEGATIVE
Ketones, UA: NEGATIVE
Leukocytes,UA: NEGATIVE
Nitrite, UA: NEGATIVE
RBC, UA: NEGATIVE
Specific Gravity, UA: 1.015 (ref 1.005–1.030)
Urobilinogen, Ur: 0.2 mg/dL (ref 0.2–1.0)
pH, UA: 5.5 (ref 5.0–7.5)

## 2022-09-02 LAB — CBC WITH DIFFERENTIAL/PLATELET
Basophils Absolute: 0.1 10*3/uL (ref 0.0–0.2)
Basos: 1 %
EOS (ABSOLUTE): 0.5 10*3/uL — ABNORMAL HIGH (ref 0.0–0.4)
Eos: 7 %
Hematocrit: 35.7 % (ref 34.0–46.6)
Hemoglobin: 12.2 g/dL (ref 11.1–15.9)
Immature Grans (Abs): 0 10*3/uL (ref 0.0–0.1)
Immature Granulocytes: 1 %
Lymphocytes Absolute: 2.2 10*3/uL (ref 0.7–3.1)
Lymphs: 34 %
MCH: 31.4 pg (ref 26.6–33.0)
MCHC: 34.2 g/dL (ref 31.5–35.7)
MCV: 92 fL (ref 79–97)
Monocytes Absolute: 0.5 10*3/uL (ref 0.1–0.9)
Monocytes: 8 %
Neutrophils Absolute: 3.2 10*3/uL (ref 1.4–7.0)
Neutrophils: 49 %
RBC: 3.89 x10E6/uL (ref 3.77–5.28)
RDW: 12.6 % (ref 11.7–15.4)
WBC: 6.5 10*3/uL (ref 3.4–10.8)

## 2022-09-02 LAB — LIPID PANEL
Chol/HDL Ratio: 3.1 ratio (ref 0.0–4.4)
Cholesterol, Total: 192 mg/dL (ref 100–199)
HDL: 61 mg/dL (ref >39)
LDL Chol Calc (NIH): 99 mg/dL (ref 0–99)
Triglycerides: 190 mg/dL — ABNORMAL HIGH (ref 0–149)
VLDL Cholesterol Cal: 32 mg/dL (ref 5–40)

## 2022-09-02 LAB — MICROSCOPIC EXAMINATION
Bacteria, UA: NONE SEEN
Casts: NONE SEEN /lpf
RBC, Urine: NONE SEEN /hpf (ref 0–2)
WBC, UA: NONE SEEN /hpf (ref 0–5)

## 2022-09-02 LAB — TSH: TSH: 3.33 u[IU]/mL (ref 0.450–4.500)

## 2022-09-02 LAB — HEMOGLOBIN A1C
Est. average glucose Bld gHb Est-mCnc: 148 mg/dL
Hgb A1c MFr Bld: 6.8 % — ABNORMAL HIGH (ref 4.8–5.6)

## 2022-09-02 LAB — MICROALBUMIN / CREATININE URINE RATIO
Creatinine, Urine: 73.3 mg/dL
Microalb/Creat Ratio: 329 mg/g creat — ABNORMAL HIGH (ref 0–29)
Microalbumin, Urine: 241.4 ug/mL

## 2022-09-21 IMAGING — MG MM DIGITAL SCREENING BILAT W/ TOMO AND CAD
8 series · 8 of 24 positions shown · non-contrast
Comparison: Previous exam(s).

CLINICAL DATA: Screening.

EXAM:
DIGITAL SCREENING BILATERAL MAMMOGRAM WITH TOMOSYNTHESIS AND CAD
TECHNIQUE: Bilateral screening digital craniocaudal and mediolateral oblique
mammograms were obtained. Bilateral screening digital breast
tomosynthesis was performed. The images were evaluated with
computer-aided detection.

[L MLO synth-2D]
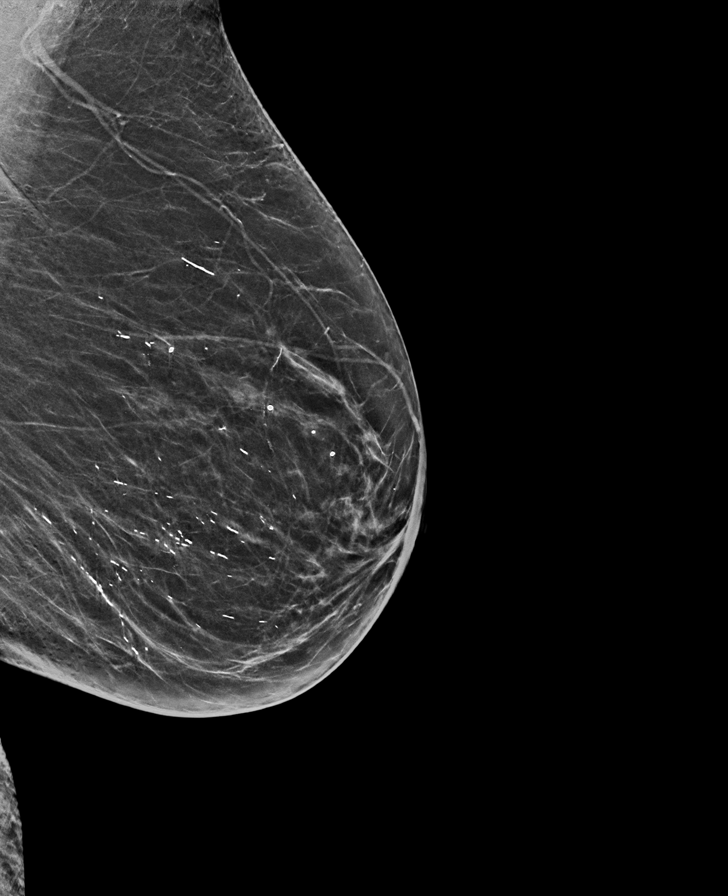

[L CC synth-2D]
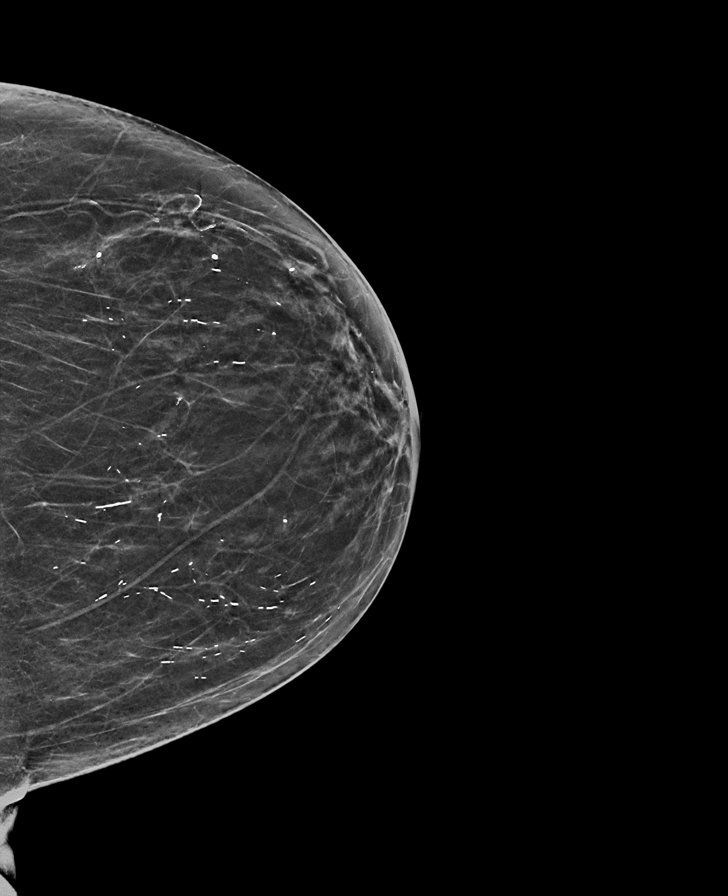

[R MLO synth-2D]
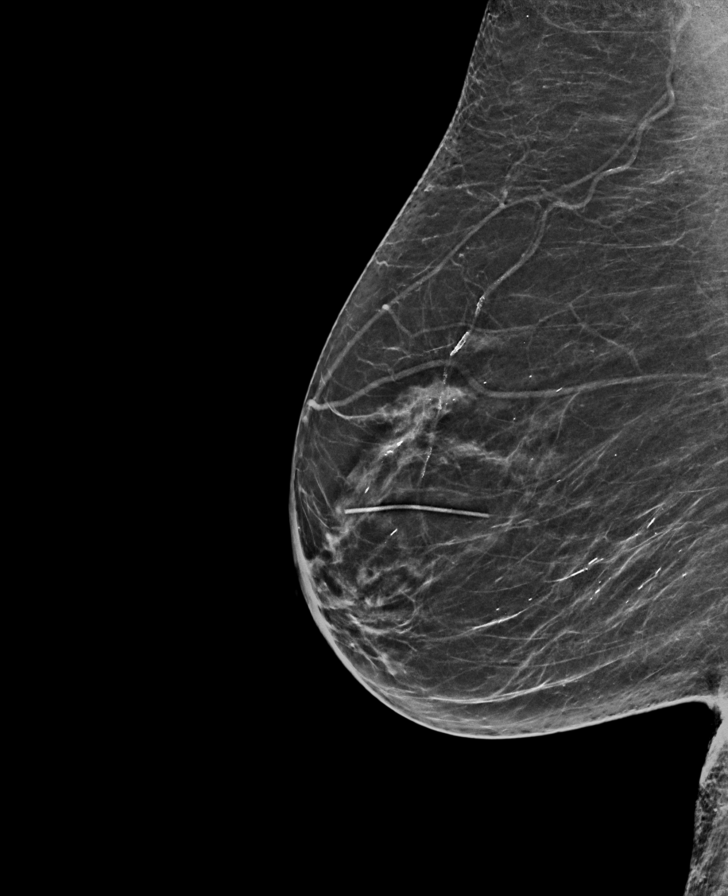

[R CC synth-2D]
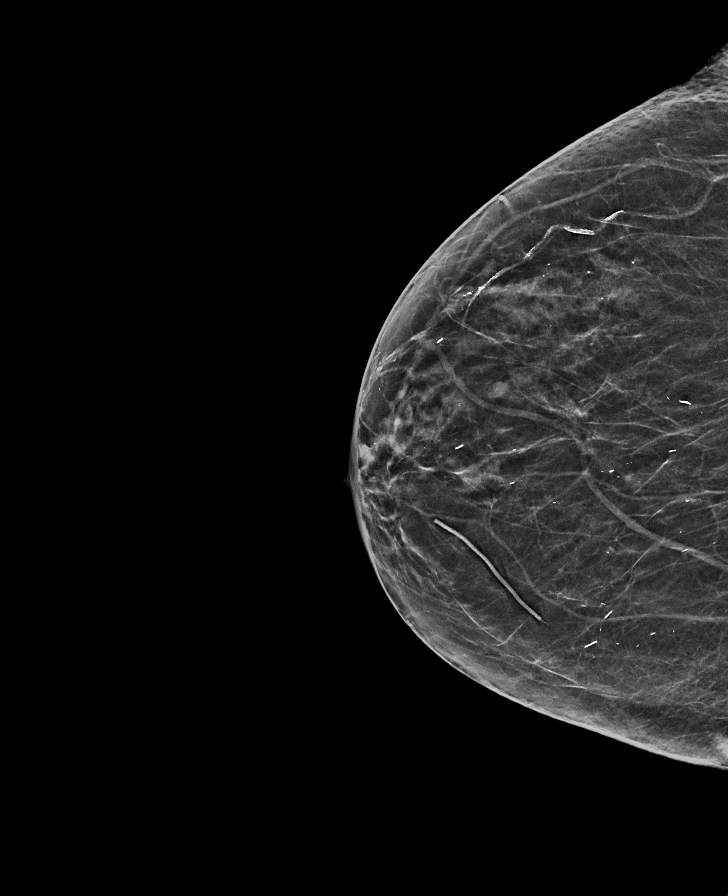

[R MLO tomo · tomo slice 30/59.0]
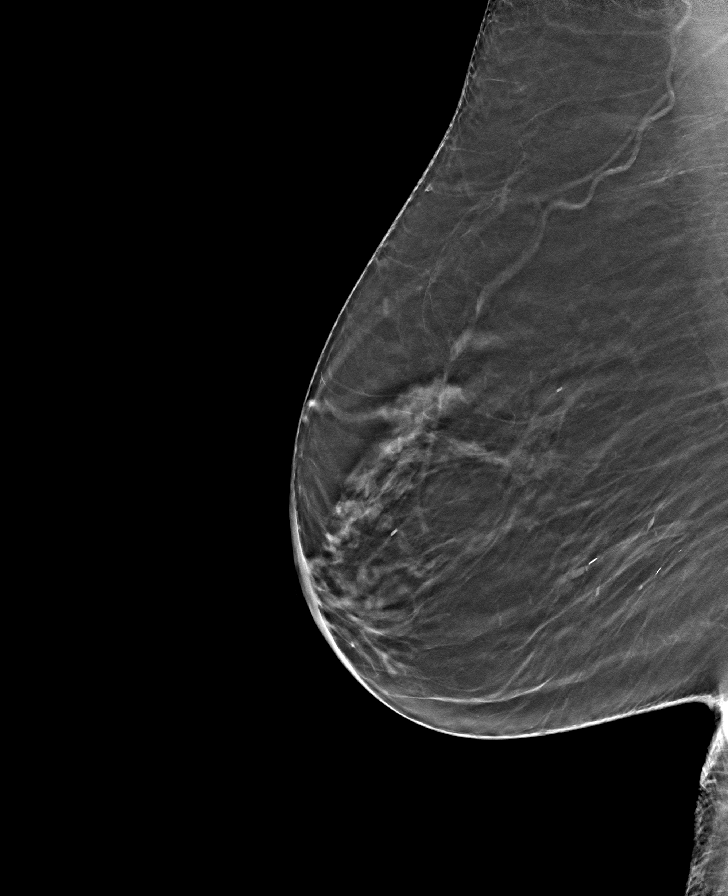

[R CC tomo · tomo slice 27/53.0]
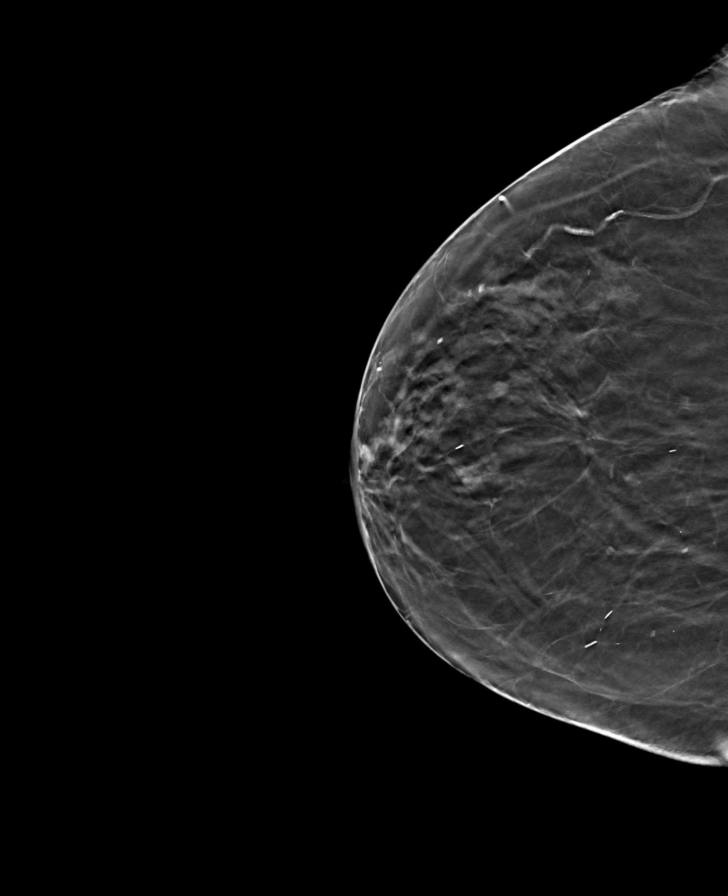

[L MLO tomo · tomo slice 31/60.0]
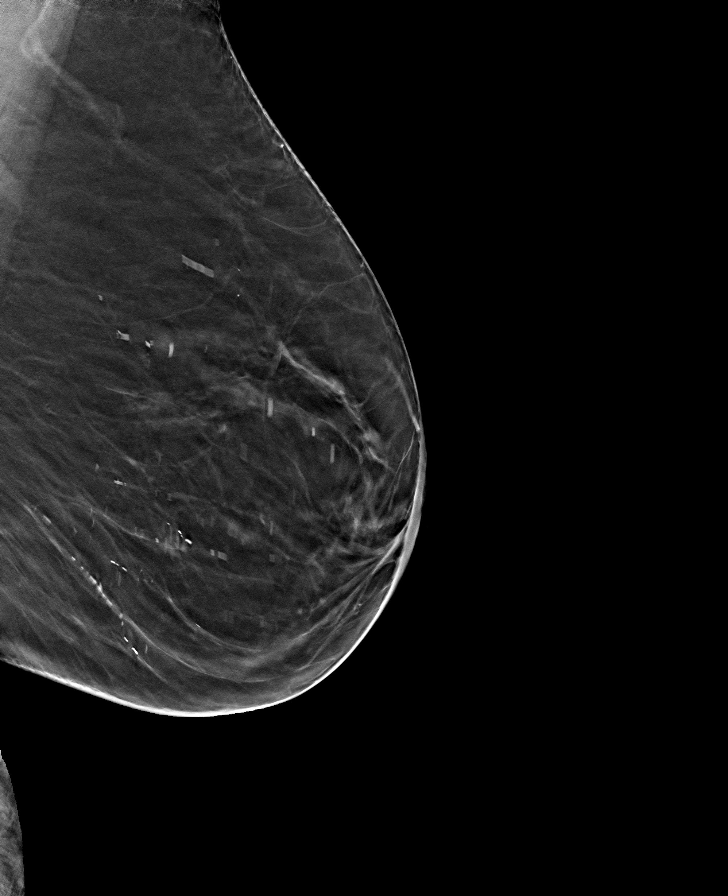

[L CC tomo · tomo slice 27/54.0]
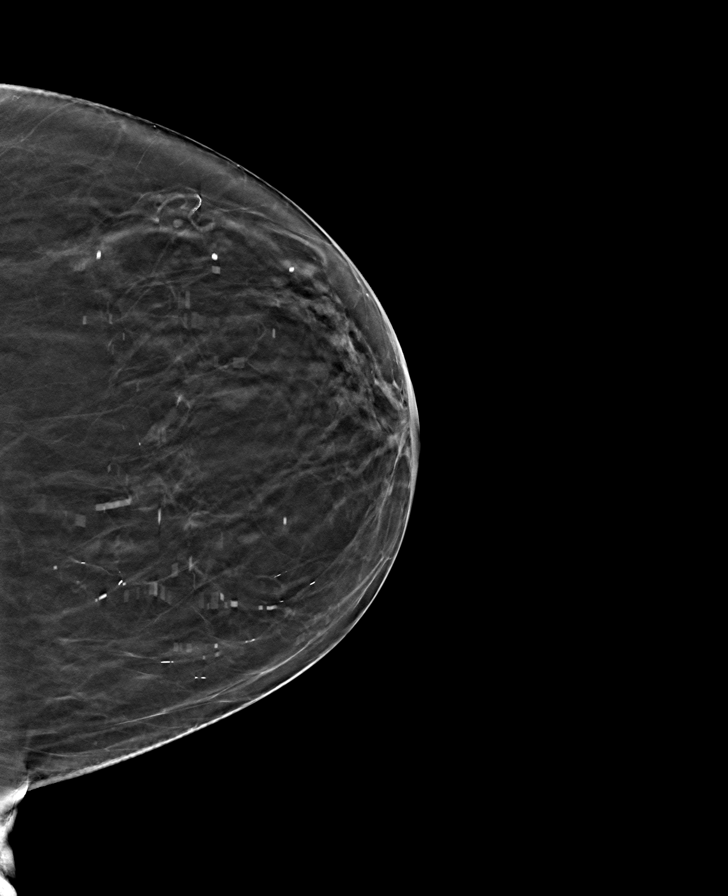

[8 of 24 positions shown; findings below may reference images not displayed]

ACR Breast Density Category b: There are scattered areas of
fibroglandular density.
FINDINGS: There are no findings suspicious for malignancy. Stable postsurgical
changes.
IMPRESSION: No mammographic evidence of malignancy. A result letter of this
screening mammogram will be mailed directly to the patient.

RECOMMENDATION:
Screening mammogram in one year. (Code:[id number]-[id number])

BI-RADS CATEGORY  2: Benign.

## 2022-10-14 ENCOUNTER — Encounter: Payer: Self-pay | Admitting: Internal Medicine

## 2022-10-14 NOTE — Telephone Encounter (Signed)
Please review.  KP

## 2022-11-29 ENCOUNTER — Telehealth: Payer: Self-pay | Admitting: Internal Medicine

## 2022-11-29 ENCOUNTER — Other Ambulatory Visit: Payer: Self-pay

## 2022-11-29 DIAGNOSIS — E1169 Type 2 diabetes mellitus with other specified complication: Secondary | ICD-10-CM

## 2022-11-29 MED ORDER — ATORVASTATIN CALCIUM 20 MG PO TABS: 20.0000 mg | ORAL_TABLET | Freq: Every day | ORAL | 0 refills | Status: DC

## 2022-11-29 MED ORDER — ATORVASTATIN CALCIUM 20 MG PO TABS: 10.0000 mg | ORAL_TABLET | Freq: Every day | ORAL | 0 refills | Status: DC

## 2022-11-29 NOTE — Telephone Encounter (Signed)
Resent Rx for 20 mg daily. Patient informed over the phone.  Samantha Lang

## 2022-11-29 NOTE — Telephone Encounter (Signed)
Copied from [place] [id number]. Topic: General - Other >> Nov 29, 2022  3:10 PM Samantha Lang wrote: Pt states provider was to increase her atorvastatin (LIPITOR)   Pt inquiring if provider is going to send in a new Rx  PT requesting a cb from [facility]

## 2022-12-02 ENCOUNTER — Other Ambulatory Visit: Payer: Self-pay | Admitting: Internal Medicine

## 2022-12-02 DIAGNOSIS — M10079 Idiopathic gout, unspecified ankle and foot: Secondary | ICD-10-CM

## 2023-01-04 ENCOUNTER — Encounter: Payer: Self-pay | Admitting: Internal Medicine

## 2023-01-04 ENCOUNTER — Ambulatory Visit (INDEPENDENT_AMBULATORY_CARE_PROVIDER_SITE_OTHER): Payer: Medicare HMO | Admitting: Internal Medicine

## 2023-01-04 VITALS — BP 128/54 | HR 74 | Ht 62.0 in | Wt 167.0 lb

## 2023-01-04 DIAGNOSIS — E1169 Type 2 diabetes mellitus with other specified complication: Secondary | ICD-10-CM | POA: Diagnosis not present

## 2023-01-04 DIAGNOSIS — R69 Illness, unspecified: Secondary | ICD-10-CM | POA: Diagnosis not present

## 2023-01-04 DIAGNOSIS — I6523 Occlusion and stenosis of bilateral carotid arteries: Secondary | ICD-10-CM | POA: Diagnosis not present

## 2023-01-04 DIAGNOSIS — E785 Hyperlipidemia, unspecified: Secondary | ICD-10-CM | POA: Diagnosis not present

## 2023-01-04 DIAGNOSIS — E118 Type 2 diabetes mellitus with unspecified complications: Secondary | ICD-10-CM | POA: Diagnosis not present

## 2023-01-04 DIAGNOSIS — R911 Solitary pulmonary nodule: Secondary | ICD-10-CM

## 2023-01-04 DIAGNOSIS — I10 Essential (primary) hypertension: Secondary | ICD-10-CM | POA: Diagnosis not present

## 2023-01-04 DIAGNOSIS — F331 Major depressive disorder, recurrent, moderate: Secondary | ICD-10-CM

## 2023-01-04 MED ORDER — TELMISARTAN-HCTZ 80-25 MG PO TABS: 1.0000 | ORAL_TABLET | Freq: Every day | ORAL | 1 refills | Status: DC

## 2023-01-04 MED ORDER — SITAGLIPTIN PHOSPHATE 100 MG PO TABS: 100.0000 mg | ORAL_TABLET | Freq: Every day | ORAL | 1 refills | Status: DC

## 2023-01-04 MED ORDER — CARVEDILOL 12.5 MG PO TABS: 12.5000 mg | ORAL_TABLET | Freq: Two times a day (BID) | ORAL | 1 refills | Status: DC

## 2023-01-04 MED ORDER — ATORVASTATIN CALCIUM 20 MG PO TABS: 20.0000 mg | ORAL_TABLET | Freq: Every day | ORAL | 1 refills | Status: DC

## 2023-01-04 NOTE — Assessment & Plan Note (Signed)
Clinically stable by exam and report without s/s of hypoglycemia. DM complicated by hypertension and dyslipidemia. Tolerating medications -  Januvia. Last A1C 6.8

## 2023-01-04 NOTE — Progress Notes (Signed)
Date:  01/04/2023   Name:  Samantha Lang   DOB:  [birth year]   MRN:  [id number]   Chief Complaint: Hypertension and Diabetes  Diabetes She presents for her follow-up diabetic visit. She has type 2 diabetes mellitus. Her disease course has been stable. Pertinent negatives for hypoglycemia include no headaches or tremors. Pertinent negatives for diabetes include no chest pain, no fatigue, no polydipsia and no polyuria. Current diabetic treatment includes oral agent (monotherapy) (januvia alone). She is compliant with treatment all of the time. There is no compliance with monitoring of blood glucose. An ACE inhibitor/angiotensin II receptor blocker is being taken. Eye exam is current.  Hyperlipidemia This is a chronic problem. The problem is uncontrolled. Pertinent negatives include no chest pain or shortness of breath. Current antihyperlipidemic treatment includes statins (dose increased to 20 mg in September). The current treatment provides moderate improvement of lipids. There are no compliance problems.   Hypertension This is a chronic problem. The problem is controlled. Pertinent negatives include no chest pain, headaches, palpitations or shortness of breath. Past treatments include angiotensin blockers, beta blockers, calcium channel blockers and diuretics. The current treatment provides significant improvement.    Lab Results  Component Value Date   NA 138 08/31/2022   K 3.6 08/31/2022   CO2 25 08/31/2022   GLUCOSE 146 (H) 08/31/2022   BUN 19 08/31/2022   CREATININE 0.82 08/31/2022   CALCIUM 9.5 08/31/2022   EGFR 73 08/31/2022   GFRNONAA 54 (L) 11/23/2020   Lab Results  Component Value Date   CHOL 192 08/31/2022   HDL 61 08/31/2022   LDLCALC 99 08/31/2022   LDLDIRECT 138.3 09/26/2011   TRIG 190 (H) 08/31/2022   CHOLHDL 3.1 08/31/2022   Lab Results  Component Value Date   TSH 3.330 08/31/2022   Lab Results  Component Value Date   HGBA1C 6.8 (H) 08/31/2022   Lab  Results  Component Value Date   WBC 6.5 08/31/2022   HGB 12.2 08/31/2022   HCT 35.7 08/31/2022   MCV 92 08/31/2022   PLT CANCELED 08/31/2022   Lab Results  Component Value Date   ALT 24 08/31/2022   AST 24 08/31/2022   ALKPHOS 85 08/31/2022   BILITOT 0.6 08/31/2022   No results found for: "25OHVITD2", "25OHVITD3", "VD25OH"   Review of Systems  Constitutional:  Negative for appetite change, fatigue, fever and unexpected weight change.  HENT:  Negative for tinnitus and trouble swallowing.   Eyes:  Negative for visual disturbance.  Respiratory:  Negative for cough, chest tightness and shortness of breath.   Cardiovascular:  Negative for chest pain, palpitations and leg swelling.  Gastrointestinal:  Negative for abdominal pain.  Endocrine: Negative for polydipsia and polyuria.  Genitourinary:  Negative for dysuria and hematuria.  Musculoskeletal:  Negative for arthralgias.  Neurological:  Negative for tremors, numbness and headaches.  Psychiatric/Behavioral:  Negative for dysphoric mood.     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   LVH (left ventricular hypertrophy) due to hypertensive disease, without heart failure 04/11/2019   Ganglion cyst of left foot 08/29/2018   Xerosis of skin 08/29/2018   Colon cancer screening    History of endometrial cancer 08/03/2016   Type II diabetes mellitus with complication ([place]) 12/28/7251   Acid reflux  10/02/2015   Hyperlipidemia associated with type 2 diabetes mellitus ([place]) 04/08/2015   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   Bilateral carotid artery stenosis 03/26/2015   Anxiety    Environmental and seasonal allergies    Postmenopausal atrophic vaginitis    Depression, major, recurrent, moderate (HCC)    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 Rosaria Ferries, eyelid lift   POLYPECTOMY  09/22/2017   Procedure: POLYPECTOMY;  Surgeon: Lucilla Lame, MD;  Location: [place];  Service: Gastroenterology;;   TUBAL LIGATION      Social History   Tobacco Use   Smoking status: Former    Packs/day: 0.25    Years: 20.00    Total pack years: 5.00    Types: Cigarettes    Quit date: 09/25/1988    Years since quitting: 34.2   Smokeless tobacco: Never  Vaping Use   Vaping Use: Never used  Substance Use Topics   Alcohol use: Yes    Alcohol/week: 6.0 standard drinks of alcohol    Types: 2 Glasses of wine, 2 Cans of beer, 2 Shots of liquor per week   Drug use: Never     Medication list has been reviewed and updated.  Current Meds  Medication Sig   allopurinol (ZYLOPRIM) 100 MG tablet TAKE (1) TABLET BY MOUTH EVERY DAY   amLODipine (NORVASC) 5 MG tablet Take 1 tablet (5 mg total) by mouth 2 (two) times daily.   aspirin 81 MG tablet Take 81 mg by mouth daily.   diclofenac (VOLTAREN) 50 MG EC tablet Take 1 tablet (50 mg total) by mouth 2 (two) times daily as needed.   methocarbamol (ROBAXIN) 500 MG tablet as needed.   Multiple Vitamin (MULTIVITAMIN) capsule Take 1 capsule by mouth daily.   ONETOUCH ULTRA test strip TEST TWICE DAILY   pantoprazole (PROTONIX) 40 MG tablet Take 1 tablet (40 mg total) by mouth daily.   psyllium (METAMUCIL SMOOTH TEXTURE) 58.6 % powder Please use one does every other day.   venlafaxine XR  (EFFEXOR-XR) 150 MG 24 hr capsule Take 1 capsule (150 mg total) by mouth daily with breakfast.   VITAMIN D PO Take 5,000 Units by mouth daily.   [DISCONTINUED] atorvastatin (LIPITOR) 20 MG tablet Take 1 tablet (20 mg total) by mouth daily.   [DISCONTINUED] carvedilol (COREG) 12.5 MG tablet Take 1 tablet (12.5 mg total) by mouth 2 (two) times daily.   [DISCONTINUED] sitaGLIPtin (JANUVIA) 100 MG tablet Take 1 tablet (100 mg total) by mouth daily.   [DISCONTINUED] telmisartan-hydrochlorothiazide (MICARDIS HCT) 80-25 MG tablet Take 1 tablet by mouth daily.       01/04/2023   10:32 AM 08/31/2022   10:59 AM 07/12/2022    8:54 AM 06/23/2022   11:05 AM  GAD 7 : Generalized Anxiety Score  Nervous, Anxious, on Edge 0 '1 1 1  '$ Control/stop worrying 0 '1 1 1  '$ Worry too much - different things 0 '1 1 1  '$ Trouble  relaxing 0 '1 1 1  '$ Restless 0 '1 1 1  '$ Easily annoyed or irritable '1 1 1 1  '$ Afraid - awful might happen 0 '2 1 1  '$ Total GAD 7 Score '1 8 7 7  '$ Anxiety Difficulty Not difficult at all Very difficult Somewhat difficult Somewhat difficult       01/04/2023   10:31 AM 08/31/2022   10:59 AM 07/12/2022    8:54 AM  Depression screen PHQ 2/9  Decreased Interest '2 1 1  '$ Down, Depressed, Hopeless '2 1 1  '$ PHQ - 2 Score '4 2 2  '$ Altered sleeping '2 2 1  '$ Tired, decreased energy 0 1 1  Change in appetite 0 1 1  Feeling bad or failure about yourself  0 1 1  Trouble concentrating 0 0 1  Moving slowly or fidgety/restless 0 1 1  Suicidal thoughts 0 0 1  PHQ-9 Score '6 8 9  '$ Difficult doing work/chores Not difficult at all Not difficult at all Somewhat difficult    BP Readings from Last 3 Encounters:  01/04/23 (!) 128/54  08/31/22 (!) 126/50  07/12/22 (!) 152/64    Physical Exam Vitals and nursing note reviewed.  Constitutional:      General: She is not in acute distress.    Appearance: Normal appearance. She is well-developed.  HENT:     Head: Normocephalic and atraumatic.  Neck:     Vascular: No carotid  bruit.  Cardiovascular:     Rate and Rhythm: Normal rate and regular rhythm.     Pulses: Normal pulses.     Heart sounds: No murmur heard. Pulmonary:     Effort: Pulmonary effort is normal. No respiratory distress.     Breath sounds: No wheezing or rhonchi.  Musculoskeletal:     Cervical back: Normal range of motion.     Right lower leg: No edema.     Left lower leg: No edema.  Lymphadenopathy:     Cervical: No cervical adenopathy.  Skin:    General: Skin is warm and dry.     Capillary Refill: Capillary refill takes less than 2 seconds.     Findings: No rash.  Neurological:     General: No focal deficit present.     Mental Status: She is alert and oriented to person, place, and time.  Psychiatric:        Mood and Affect: Mood normal.        Behavior: Behavior normal.     Wt Readings from Last 3 Encounters:  01/04/23 167 lb (75.8 kg)  08/31/22 168 lb (76.2 kg)  07/12/22 167 lb (75.8 kg)    BP (!) 128/54   Pulse 74   Ht '5\' 2"'$  (1.575 m)   Wt 167 lb (75.8 kg)   SpO2 98%   BMI 30.54 kg/m   Assessment and Plan: Problem List Items Addressed This Visit       Cardiovascular and Mediastinum   Bilateral carotid artery stenosis (Chronic)    On statin therapy with aspirin      Relevant Medications   carvedilol (COREG) 12.5 MG tablet   atorvastatin (LIPITOR) 20 MG tablet   telmisartan-hydrochlorothiazide (MICARDIS HCT) 80-25 MG tablet   Essential hypertension (Chronic)    Clinically stable exam with well controlled BP on micardis hct, amlodipine and coreg. Tolerating medications without side effects at this time. Pt to continue current regimen and low sodium diet; benefits of regular exercise as able discussed.       Relevant Medications  carvedilol (COREG) 12.5 MG tablet   atorvastatin (LIPITOR) 20 MG tablet   telmisartan-hydrochlorothiazide (MICARDIS HCT) 80-25 MG tablet     Respiratory   Nodule of apex of left lung     Endocrine   Hyperlipidemia associated  with type 2 diabetes mellitus (HCC) (Chronic)    Last LDL 99 on atorvastatin 10 mg Dose increased to 20 mg last visit No side effects noted.      Relevant Medications   sitaGLIPtin (JANUVIA) 100 MG tablet   carvedilol (COREG) 12.5 MG tablet   atorvastatin (LIPITOR) 20 MG tablet   telmisartan-hydrochlorothiazide (MICARDIS HCT) 80-25 MG tablet   Other Relevant Orders   Comprehensive metabolic panel   Lipid panel   Type II diabetes mellitus with complication (HCC) - Primary (Chronic)    Clinically stable by exam and report without s/s of hypoglycemia. DM complicated by hypertension and dyslipidemia. Tolerating medications -  Januvia. Last A1C 6.8       Relevant Medications   sitaGLIPtin (JANUVIA) 100 MG tablet   atorvastatin (LIPITOR) 20 MG tablet   telmisartan-hydrochlorothiazide (MICARDIS HCT) 80-25 MG tablet   Other Relevant Orders   Comprehensive metabolic panel   Hemoglobin A1c     Other   Depression, major, recurrent, moderate (HCC) (Chronic)    Depression symptoms well controlled on Effexor. Continue current therapy without change        Partially dictated using Editor, commissioning. Any errors are unintentional.  Halina Maidens, MD [facility]  01/04/2023

## 2023-01-04 NOTE — Assessment & Plan Note (Signed)
Last LDL 99 on atorvastatin 10 mg Dose increased to 20 mg last visit No side effects noted.

## 2023-01-04 NOTE — Assessment & Plan Note (Signed)
Depression symptoms well controlled on Effexor. Continue current therapy without change

## 2023-01-04 NOTE — Assessment & Plan Note (Addendum)
Clinically stable exam with well controlled BP on micardis hct, amlodipine and coreg. Tolerating medications without side effects at this time. Pt to continue current regimen and low sodium diet; benefits of regular exercise as able discussed.

## 2023-01-04 NOTE — Assessment & Plan Note (Addendum)
On statin therapy with aspirin

## 2023-01-05 LAB — COMPREHENSIVE METABOLIC PANEL
ALT: 21 IU/L (ref 0–32)
AST: 22 IU/L (ref 0–40)
Albumin/Globulin Ratio: 1.5 (ref 1.2–2.2)
Albumin: 4.1 g/dL (ref 3.8–4.8)
Alkaline Phosphatase: 90 IU/L (ref 44–121)
BUN/Creatinine Ratio: 32 — ABNORMAL HIGH (ref 12–28)
BUN: 25 mg/dL (ref 8–27)
Bilirubin Total: 0.7 mg/dL (ref 0.0–1.2)
CO2: 25 mmol/L (ref 20–29)
Calcium: 9.4 mg/dL (ref 8.7–10.3)
Chloride: 96 mmol/L (ref 96–106)
Creatinine, Ser: 0.78 mg/dL (ref 0.57–1.00)
Globulin, Total: 2.7 g/dL (ref 1.5–4.5)
Glucose: 164 mg/dL — ABNORMAL HIGH (ref 70–99)
Potassium: 3.5 mmol/L (ref 3.5–5.2)
Sodium: 137 mmol/L (ref 134–144)
Total Protein: 6.8 g/dL (ref 6.0–8.5)
eGFR: 77 mL/min/{1.73_m2} (ref >59)

## 2023-01-05 LAB — HEMOGLOBIN A1C
Est. average glucose Bld gHb Est-mCnc: 157 mg/dL
Hgb A1c MFr Bld: 7.1 % — ABNORMAL HIGH (ref 4.8–5.6)

## 2023-01-05 LAB — LIPID PANEL
Chol/HDL Ratio: 2.7 ratio (ref 0.0–4.4)
Cholesterol, Total: 162 mg/dL (ref 100–199)
HDL: 60 mg/dL (ref >39)
LDL Chol Calc (NIH): 52 mg/dL (ref 0–99)
Triglycerides: 330 mg/dL — ABNORMAL HIGH (ref 0–149)
VLDL Cholesterol Cal: 50 mg/dL — ABNORMAL HIGH (ref 5–40)

## 2023-01-18 ENCOUNTER — Ambulatory Visit: Payer: Medicare HMO

## 2023-01-18 DIAGNOSIS — K219 Gastro-esophageal reflux disease without esophagitis: Secondary | ICD-10-CM | POA: Diagnosis not present

## 2023-01-18 DIAGNOSIS — H90A22 Sensorineural hearing loss, unilateral, left ear, with restricted hearing on the contralateral side: Secondary | ICD-10-CM | POA: Diagnosis not present

## 2023-01-20 ENCOUNTER — Ambulatory Visit: Payer: Medicare HMO

## 2023-02-16 ENCOUNTER — Ambulatory Visit (INDEPENDENT_AMBULATORY_CARE_PROVIDER_SITE_OTHER): Payer: Medicare HMO

## 2023-02-16 VITALS — Ht 62.0 in | Wt 167.0 lb

## 2023-02-16 DIAGNOSIS — Z0000 Encounter for general adult medical examination without abnormal findings: Secondary | ICD-10-CM | POA: Diagnosis not present

## 2023-02-16 NOTE — Progress Notes (Signed)
I connected with  Haze Boyden on 02/16/23 by a audio enabled telemedicine application and verified that I am speaking with the correct person using two identifiers.  Patient Location: Home  Provider Location: Office/Clinic  I discussed the limitations of evaluation and management by telemedicine. The patient expressed understanding and agreed to proceed.  Subjective:   TOMEKA GUAY is a 82 y.o. female who presents for Medicare Annual (Subsequent) preventive examination.  Review of Systems     Cardiac Risk Factors include: advanced age (>61mn, >>62women);diabetes mellitus;dyslipidemia;hypertension;sedentary lifestyle     Objective:    There were no vitals filed for this visit. There is no height or weight on file to calculate BMI.     02/16/2023   11:04 AM 02/17/2022   10:54 AM 01/17/2022   11:42 AM 01/13/2021   11:58 AM 11/02/2020    9:33 AM 10/28/2020    1:05 PM 01/13/2020   11:40 AM  Advanced Directives  Does Patient Have a Medical Advance Directive? No Yes No Yes No No No  Type of ACorporate treasurerof [place]Living will  [place] will     Copy of [place] Chart?    No - copy requested     Would patient like information on creating a medical advance directive? No - Patient declined  No - Patient declined    Yes (MAU/Ambulatory/Procedural Areas - Information given)    Current Medications (verified) Outpatient Encounter Medications as of 02/16/2023  Medication Sig   allopurinol (ZYLOPRIM) 100 MG tablet TAKE (1) TABLET BY MOUTH EVERY DAY   amLODipine (NORVASC) 5 MG tablet Take 1 tablet (5 mg total) by mouth 2 (two) times daily.   aspirin 81 MG tablet Take 81 mg by mouth daily.   atorvastatin (LIPITOR) 20 MG tablet Take 1 tablet (20 mg total) by mouth daily.   carvedilol (COREG) 12.5 MG tablet Take 1 tablet (12.5 mg total) by mouth 2 (two) times daily.   diclofenac (VOLTAREN) 50 MG EC tablet Take 1  tablet (50 mg total) by mouth 2 (two) times daily as needed.   Multiple Vitamin (MULTIVITAMIN) capsule Take 1 capsule by mouth daily.   ONETOUCH ULTRA test strip TEST TWICE DAILY   pantoprazole (PROTONIX) 40 MG tablet Take 1 tablet (40 mg total) by mouth daily.   psyllium (METAMUCIL SMOOTH TEXTURE) 58.6 % powder Please use one does every other day.   sitaGLIPtin (JANUVIA) 100 MG tablet Take 1 tablet (100 mg total) by mouth daily.   telmisartan-hydrochlorothiazide (MICARDIS HCT) 80-25 MG tablet Take 1 tablet by mouth daily.   venlafaxine XR (EFFEXOR-XR) 150 MG 24 hr capsule Take 1 capsule (150 mg total) by mouth daily with breakfast.   VITAMIN D PO Take 5,000 Units by mouth daily.   methocarbamol (ROBAXIN) 500 MG tablet as needed. (Patient not taking: Reported on 02/16/2023)   No facility-administered encounter medications on file as of 02/16/2023.    Allergies (verified) Cephalexin and Clarithromycin   History: Past Medical History:  Diagnosis Date   Acquired trigger finger 01/13/2021   Allergy    Anxiety    Aortic valve stenosis, mild    Depression    mild   Diabetes mellitus age 82  GERD (gastroesophageal reflux disease)    Heart murmur    Hemorrhoids    Hyperlipidemia    Hypertension 2003   Incontinence    Female stress   Mitral incompetence    Personal history of  chemotherapy    3 treatments   Personal history of radiation therapy    5 treatments   Postmenopausal atrophic vaginitis    Radial styloid tenosynovitis 01/13/2021   Torn rotator cuff    Trochanteric bursitis of left hip 12/07/2016   Uterine cancer [facility])    treated   Past Surgical History:  Procedure Laterality Date   ABDOMINAL HYSTERECTOMY  07/2016   BREAST BIOPSY Right 03/21/08   right, benign    COLONOSCOPY WITH PROPOFOL N/A 09/22/2017   Procedure: COLONOSCOPY WITH PROPOFOL;  Surgeon: Lucilla Lame, MD;  Location: [place];  Service: Gastroenterology;  Laterality: N/A;  diabetic   EYE  SURGERY  Oct 2009   for ptosis,  Dr. Rosaria Ferries, eyelid lift   POLYPECTOMY  09/22/2017   Procedure: POLYPECTOMY;  Surgeon: Lucilla Lame, MD;  Location: [place];  Service: Gastroenterology;;   TUBAL LIGATION     Family History  Problem Relation Age of Onset   Breast cancer Paternal Aunt    Breast cancer Paternal Grandmother 79   Cancer Father 42       lung   Stroke Mother    Hypertension Mother    Cancer Brother        prostate   Depression Brother    Ovarian cancer Neg Hx    Colon cancer Neg Hx    Diabetes Neg Hx    Social History   Socioeconomic History   Marital status: Married    Spouse name: Sue Lush   Number of children: 2   Years of education: 53   Highest education level: Master's degree (e.g., MA, MS, MEng, MEd, MSW, MBA)  Occupational History   Occupation: Retired    Comment: Engineer, mining  Tobacco Use   Smoking status: Former    Packs/day: 0.25    Years: 20.00    Total pack years: 5.00    Types: Cigarettes    Quit date: 09/25/1988    Years since quitting: 34.4   Smokeless tobacco: Never  Vaping Use   Vaping Use: Never used  Substance and Sexual Activity   Alcohol use: Yes    Alcohol/week: 6.0 standard drinks of alcohol    Types: 2 Glasses of wine, 2 Cans of beer, 2 Shots of liquor per week   Drug use: Never   Sexual activity: Yes    Partners: Male    Birth control/protection: Surgical  Other Topics Concern   Not on file  Social History Narrative   Not on file   Social Determinants of Health   Financial Resource Strain: Low Risk  (02/16/2023)   Overall Financial Resource Strain (CARDIA)    Difficulty of Paying Living Expenses: Not hard at all  Food Insecurity: No Food Insecurity (02/16/2023)   Hunger Vital Sign    Worried About Running Out of Food in the Last Year: Never true    Ran Out of Food in the Last Year: Never true  Transportation Needs: No Transportation Needs (02/16/2023)   PRAPARE - Transportation    Lack of  Transportation (Medical): No    Lack of Transportation (Non-Medical): No  Physical Activity: Inactive (02/16/2023)   Exercise Vital Sign    Days of Exercise per Week: 0 days    Minutes of Exercise per Session: 0 min  Stress: No Stress Concern Present (02/16/2023)   Heil    Feeling of Stress : Not at all  Social Connections: Moderately Isolated (02/16/2023)   Social  Connection and Isolation Panel [NHANES]    Frequency of Communication with Friends and Family: More than three times a week    Frequency of Social Gatherings with Friends and Family: Twice a week    Attends Religious Services: Never    Printmaker: No    Attends Music therapist: Not on file    Marital Status: Married    Tobacco Counseling Counseling given: Not Answered   Clinical Intake:  Pre-visit preparation completed: Yes  Pain : No/denies pain     Nutritional Risks: None Diabetes: Yes CBG done?: No Did pt. bring in CBG monitor from home?: No  How often do you need to have someone help you when you read instructions, pamphlets, or other written materials from your doctor or pharmacy?: 1 - Never  Diabetic?yes Nutrition Risk Assessment:  Has the patient had any N/V/D within the last 2 months?  Yes  Does the patient have any non-healing wounds?  No  Has the patient had any unintentional weight loss or weight gain?  No   Diabetes:  Is the patient diabetic?  Yes  If diabetic, was a CBG obtained today?  No  Did the patient bring in their glucometer from home?  No  How often do you monitor your CBG's? never.   Financial Strains and Diabetes Management:  Are you having any financial strains with the device, your supplies or your medication? No .  Does the patient want to be seen by Chronic Care Management for management of their diabetes?  No  Would the patient like to be referred to a  Nutritionist or for Diabetic Management?  No   Diabetic Exams:  Diabetic Eye Exam: Completed 07/04/22. Pt has been advised about the importance in completing this exam.  Diabetic Foot Exam: Completed 08/31/22. Pt has been advised about the importance in completing this exam.   Interpreter Needed?: No  Information entered by :: Kirke Shaggy, LPN   Activities of Daily Living    02/16/2023   11:08 AM 03/25/2022   10:31 AM  In your present state of health, do you have any difficulty performing the following activities:  Hearing? 0 1  Vision? 0 0  Difficulty concentrating or making decisions? 0 1  Walking or climbing stairs? 0 0  Dressing or bathing? 0 0  Doing errands, shopping? 0 0  Preparing Food and eating ? N   Using the Toilet? N   In the past six months, have you accidently leaked urine? N   Do you have problems with loss of bowel control? N   Managing your Medications? N   Managing your Finances? N   Housekeeping or managing your Housekeeping? N     Patient Care Team: Glean Hess, MD as PCP - General (Internal Medicine) Corey Skains, MD as Consulting Physician (Cardiology) Monia Pouch Consuello Masse, MD as Referring Physician (Obstetrics and Gynecology) Margaretha Sheffield, MD (Otolaryngology)  Indicate any recent Medical Services you may have received from other than [facility] providers in the past year (date may be approximate).     Assessment:   This is a routine wellness examination for [place].  Hearing/Vision screen Hearing Screening - Comments:: No aids- has appt to see audiologist Vision Screening - Comments:: [facility]   Dietary issues and exercise activities discussed: Current Exercise Habits: The patient does not participate in regular exercise at present   Goals Addressed  This Visit's Progress    DIET - EAT MORE FRUITS AND VEGETABLES         Depression Screen    02/16/2023   11:01 AM 01/04/2023   10:31 AM 08/31/2022    10:59 AM 07/12/2022    8:54 AM 06/23/2022   11:05 AM 05/17/2022   10:35 AM 03/25/2022   10:31 AM  PHQ 2/9 Scores  PHQ - 2 Score 1 4 2 2 2 4 6  $ PHQ- 9 Score 3 6 8 9 9 11 17    $ Fall Risk    02/16/2023   11:07 AM 01/04/2023   10:33 AM 08/31/2022   11:00 AM 07/12/2022    8:54 AM 06/23/2022   11:05 AM  Fall Risk   Falls in the past year? 0 0 0 0 1  Number falls in past yr: 0 0 0 0 0  Injury with Fall? 0 0 0 0 0  Risk for fall due to : No Fall Risks No Fall Risks No Fall Risks No Fall Risks History of fall(s)  Follow up Falls prevention discussed;Falls evaluation completed Falls evaluation completed Falls evaluation completed Falls evaluation completed Falls evaluation completed    FALL RISK PREVENTION PERTAINING TO THE HOME:  Any stairs in or around the home? Yes  If so, are there any without handrails? No  Home free of loose throw rugs in walkways, pet beds, electrical cords, etc? Yes  Adequate lighting in your home to reduce risk of falls? Yes   ASSISTIVE DEVICES UTILIZED TO PREVENT FALLS:  Life alert? No  Use of a cane, walker or w/c? No  Grab bars in the bathroom? Yes  Shower chair or bench in shower? Yes  Elevated toilet seat or a handicapped toilet? No     Cognitive Function:        02/16/2023   11:08 AM 01/28/2019   10:49 AM 11/13/2017    3:56 PM  6CIT Screen  What Year? 0 points 0 points 0 points  What month? 0 points 0 points 0 points  What time? 0 points 0 points 3 points  Count back from 20 0 points 0 points 0 points  Months in reverse 0 points 0 points 0 points  Repeat phrase 4 points 4 points 4 points  Total Score 4 points 4 points 7 points    Immunizations Immunization History  Administered Date(s) Administered   Fluad Quad(high Dose 65+) 10/09/2019   Influenza, High Dose Seasonal PF 11/12/2018   Influenza,inj,Quad PF,6+ Mos 10/02/2015, 11/07/2016, 11/01/2017   Pneumococcal Conjugate-13 02/05/2014   Pneumococcal Polysaccharide-23 06/09/2008   Tdap  06/09/2010, 10/28/2020    TDAP status: Up to date  Flu Vaccine status: Declined, Education has been provided regarding the importance of this vaccine but patient still declined. Advised may receive this vaccine at local pharmacy or Health Dept. Aware to provide a copy of the vaccination record if obtained from local pharmacy or Health Dept. Verbalized acceptance and understanding.  Pneumococcal vaccine status: Up to date  Covid-19 vaccine status: Declined, Education has been provided regarding the importance of this vaccine but patient still declined. Advised may receive this vaccine at local pharmacy or Health Dept.or vaccine clinic. Aware to provide a copy of the vaccination record if obtained from local pharmacy or Health Dept. Verbalized acceptance and understanding.  Qualifies for Shingles Vaccine? Yes   Zostavax completed No   Shingrix Completed?: No.    Education has been provided regarding the importance of this vaccine. Patient has been advised  to call insurance company to determine out of pocket expense if they have not yet received this vaccine. Advised may also receive vaccine at local pharmacy or Health Dept. Verbalized acceptance and understanding.  Screening Tests Health Maintenance  Topic Date Due   Zoster Vaccines- Shingrix (1 of 2) Never done   INFLUENZA VACCINE  03/26/2023 (Originally 07/26/2022)   MAMMOGRAM  06/01/2023   HEMOGLOBIN A1C  07/05/2023   OPHTHALMOLOGY EXAM  07/05/2023   Diabetic kidney evaluation - Urine ACR  09/01/2023   FOOT EXAM  09/01/2023   Diabetic kidney evaluation - eGFR measurement  01/05/2024   Medicare Annual Wellness (AWV)  02/17/2024   DTaP/Tdap/Td (3 - Td or Tdap) 10/28/2030   Pneumonia Vaccine 87+ Years old  Completed   DEXA SCAN  Completed   HPV VACCINES  Aged Out   COVID-19 Vaccine  Discontinued    Health Maintenance  Health Maintenance Due  Topic Date Due   Zoster Vaccines- Shingrix (1 of 2) Never done    Colorectal cancer  screening: No longer required.   Mammogram status: No longer required due to age.- had on 05/31/22  Bone Density status: Completed 03/04/20. Results reflect: Bone density results: OSTEOPENIA. Repeat every 5 years.  Lung Cancer Screening: (Low Dose CT Chest recommended if Age 72-80 years, 30 pack-year currently smoking OR have quit w/in 15years.) does not qualify.   Additional Screening:  Hepatitis C Screening: does not qualify; Completed no  Vision Screening: Recommended annual ophthalmology exams for early detection of glaucoma and other disorders of the eye. Is the patient up to date with their annual eye exam?  Yes  Who is the provider or what is the name of the office in which the patient attends annual eye exams? [place] [facility]  If pt is not established with a provider, would they like to be referred to a provider to establish care? No .   Dental Screening: Recommended annual dental exams for proper oral hygiene  Community Resource Referral / Chronic Care Management: CRR required this visit?  No   CCM required this visit?  No      Plan:     I have personally reviewed and noted the following in the patient's chart:   Medical and social history Use of alcohol, tobacco or illicit drugs  Current medications and supplements including opioid prescriptions. Patient is not currently taking opioid prescriptions. Functional ability and status Nutritional status Physical activity Advanced directives List of other physicians Hospitalizations, surgeries, and ER visits in previous 12 months Vitals Screenings to include cognitive, depression, and falls Referrals and appointments  In addition, I have reviewed and discussed with patient certain preventive protocols, quality metrics, and best practice recommendations. A written personalized care plan for preventive services as well as general preventive health recommendations were provided to patient.     Dionisio David,  LPN   [id number]   Nurse Notes: none

## 2023-02-16 NOTE — Patient Instructions (Signed)
[name] , Thank you for taking time to come for your Medicare Wellness Visit. I appreciate your ongoing commitment to your health goals. Please review the following plan we discussed and let me know if I can assist you in the future.   These are the goals we discussed:  Goals      DIET - EAT MORE FRUITS AND VEGETABLES     Exercise 150 min/wk Moderate Activity     Recommend to exercise at least 3-4 times per week for at least 30-40 minutes        This is a list of the screening recommended for you and due dates:  Health Maintenance  Topic Date Due   Zoster (Shingles) Vaccine (1 of 2) Never done   Flu Shot  03/26/2023*   Mammogram  06/01/2023   Hemoglobin A1C  07/05/2023   Eye exam for diabetics  07/05/2023   Yearly kidney health urinalysis for diabetes  09/01/2023   Complete foot exam   09/01/2023   Yearly kidney function blood test for diabetes  01/05/2024   Medicare Annual Wellness Visit  02/17/2024   DTaP/Tdap/Td vaccine (3 - Td or Tdap) 10/28/2030   Pneumonia Vaccine  Completed   DEXA scan (bone density measurement)  Completed   HPV Vaccine  Aged Out   COVID-19 Vaccine  Discontinued  *Topic was postponed. The date shown is not the original due date.    Advanced directives: no  Conditions/risks identified: none  Next appointment: Follow up in one year for your annual wellness visit 02/21/24 @ 3:30 pm by phone   Preventive Care 65 Years and Older, Female Preventive care refers to lifestyle choices and visits with your health care provider that can promote health and wellness. What does preventive care include? A yearly physical exam. This is also called an annual well check. Dental exams once or twice a year. Routine eye exams. Ask your health care provider how often you should have your eyes checked. Personal lifestyle choices, including: Daily care of your teeth and gums. Regular physical activity. Eating a healthy diet. Avoiding tobacco and drug use. Limiting  alcohol use. Practicing safe sex. Taking low-dose aspirin every day. Taking vitamin and mineral supplements as recommended by your health care provider. What happens during an annual well check? The services and screenings done by your health care provider during your annual well check will depend on your age, overall health, lifestyle risk factors, and family history of disease. Counseling  Your health care provider may ask you questions about your: Alcohol use. Tobacco use. Drug use. Emotional well-being. Home and relationship well-being. Sexual activity. Eating habits. History of falls. Memory and ability to understand (cognition). Work and work Statistician. Reproductive health. Screening  You may have the following tests or measurements: Height, weight, and BMI. Blood pressure. Lipid and cholesterol levels. These may be checked every 5 years, or more frequently if you are over 42 years old. Skin check. Lung cancer screening. You may have this screening every year starting at age 33 if you have a 30-pack-year history of smoking and currently smoke or have quit within the past 15 years. Fecal occult blood test (FOBT) of the stool. You may have this test every year starting at age 44. Flexible sigmoidoscopy or colonoscopy. You may have a sigmoidoscopy every 5 years or a colonoscopy every 10 years starting at age 35. Hepatitis C blood test. Hepatitis B blood test. Sexually transmitted disease (STD) testing. Diabetes screening. This is done by checking your  blood sugar (glucose) after you have not eaten for a while (fasting). You may have this done every 1-3 years. Bone density scan. This is done to screen for osteoporosis. You may have this done starting at age 77. Mammogram. This may be done every 1-2 years. Talk to your health care provider about how often you should have regular mammograms. Talk with your health care provider about your test results, treatment options, and if  necessary, the need for more tests. Vaccines  Your health care provider may recommend certain vaccines, such as: Influenza vaccine. This is recommended every year. Tetanus, diphtheria, and acellular pertussis (Tdap, Td) vaccine. You may need a Td booster every 10 years. Zoster vaccine. You may need this after age 54. Pneumococcal 13-valent conjugate (PCV13) vaccine. One dose is recommended after age 13. Pneumococcal polysaccharide (PPSV23) vaccine. One dose is recommended after age 86. Talk to your health care provider about which screenings and vaccines you need and how often you need them. This information is not intended to replace advice given to you by your health care provider. Make sure you discuss any questions you have with your health care provider. Document Released: 01/08/2016 Document Revised: 08/31/2016 Document Reviewed: 10/13/2015 Elsevier Interactive Patient Education  2017 [place] Prevention in the Home Falls can cause injuries. They can happen to people of all ages. There are many things you can do to make your home safe and to help prevent falls. What can I do on the outside of my home? Regularly fix the edges of walkways and driveways and fix any cracks. Remove anything that might make you trip as you walk through a door, such as a raised step or threshold. Trim any bushes or trees on the path to your home. Use bright outdoor lighting. Clear any walking paths of anything that might make someone trip, such as rocks or tools. Regularly check to see if handrails are loose or broken. Make sure that both sides of any steps have handrails. Any raised decks and porches should have guardrails on the edges. Have any leaves, snow, or ice cleared regularly. Use sand or salt on walking paths during winter. Clean up any spills in your garage right away. This includes oil or grease spills. What can I do in the bathroom? Use night lights. Install grab bars by the toilet  and in the tub and shower. Do not use towel bars as grab bars. Use non-skid mats or decals in the tub or shower. If you need to sit down in the shower, use a plastic, non-slip stool. Keep the floor dry. Clean up any water that spills on the floor as soon as it happens. Remove soap buildup in the tub or shower regularly. Attach bath mats securely with double-sided non-slip rug tape. Do not have throw rugs and other things on the floor that can make you trip. What can I do in the bedroom? Use night lights. Make sure that you have a light by your bed that is easy to reach. Do not use any sheets or blankets that are too big for your bed. They should not hang down onto the floor. Have a firm chair that has side arms. You can use this for support while you get dressed. Do not have throw rugs and other things on the floor that can make you trip. What can I do in the kitchen? Clean up any spills right away. Avoid walking on wet floors. Keep items that you use a lot in  easy-to-reach places. If you need to reach something above you, use a strong step stool that has a grab bar. Keep electrical cords out of the way. Do not use floor polish or wax that makes floors slippery. If you must use wax, use non-skid floor wax. Do not have throw rugs and other things on the floor that can make you trip. What can I do with my stairs? Do not leave any items on the stairs. Make sure that there are handrails on both sides of the stairs and use them. Fix handrails that are broken or loose. Make sure that handrails are as long as the stairways. Check any carpeting to make sure that it is firmly attached to the stairs. Fix any carpet that is loose or worn. Avoid having throw rugs at the top or bottom of the stairs. If you do have throw rugs, attach them to the floor with carpet tape. Make sure that you have a light switch at the top of the stairs and the bottom of the stairs. If you do not have them, ask someone to add  them for you. What else can I do to help prevent falls? Wear shoes that: Do not have high heels. Have rubber bottoms. Are comfortable and fit you well. Are closed at the toe. Do not wear sandals. If you use a stepladder: Make sure that it is fully opened. Do not climb a closed stepladder. Make sure that both sides of the stepladder are locked into place. Ask someone to hold it for you, if possible. Clearly mark and make sure that you can see: Any grab bars or handrails. First and last steps. Where the edge of each step is. Use tools that help you move around (mobility aids) if they are needed. These include: Canes. Walkers. Scooters. Crutches. Turn on the lights when you go into a dark area. Replace any light bulbs as soon as they burn out. Set up your furniture so you have a clear path. Avoid moving your furniture around. If any of your floors are uneven, fix them. If there are any pets around you, be aware of where they are. Review your medicines with your doctor. Some medicines can make you feel dizzy. This can increase your chance of falling. Ask your doctor what other things that you can do to help prevent falls. This information is not intended to replace advice given to you by your health care provider. Make sure you discuss any questions you have with your health care provider. Document Released: 10/08/2009 Document Revised: 05/19/2016 Document Reviewed: 01/16/2015 Elsevier Interactive Patient Education  2017 [employer].

## 2023-03-15 ENCOUNTER — Ambulatory Visit (INDEPENDENT_AMBULATORY_CARE_PROVIDER_SITE_OTHER): Payer: Medicare HMO | Admitting: Internal Medicine

## 2023-03-15 ENCOUNTER — Encounter: Payer: Self-pay | Admitting: Internal Medicine

## 2023-03-15 VITALS — BP 126/50 | HR 69 | Ht 62.0 in | Wt 167.0 lb

## 2023-03-15 DIAGNOSIS — K582 Mixed irritable bowel syndrome: Secondary | ICD-10-CM

## 2023-03-15 DIAGNOSIS — R42 Dizziness and giddiness: Secondary | ICD-10-CM | POA: Diagnosis not present

## 2023-03-15 DIAGNOSIS — F40243 Fear of flying: Secondary | ICD-10-CM | POA: Diagnosis not present

## 2023-03-15 DIAGNOSIS — R69 Illness, unspecified: Secondary | ICD-10-CM | POA: Diagnosis not present

## 2023-03-15 NOTE — Assessment & Plan Note (Signed)
Suspected IBS with normal colonoscopy 2018 Symptoms have been on and off for years Recommend stopping fiber supplements and monitoring symptoms

## 2023-03-15 NOTE — Progress Notes (Signed)
Date:  03/15/2023   Name:  Samantha Lang   DOB:  03/22/1941   MRN:  [id number]   Chief Complaint: Diarrhea (Started several years ago but still happening. Intermittent diarrhea. Taking metamucil. Has BM everyday around 10am. Sometimes she doesn't know if she should pass gas because she doesn't know if she will pass stool.) and Dizziness (Dizziness when bending over in the mornings. Started 2 weeks ago.)  Diarrhea  This is a recurrent problem. The problem occurs 2 to 4 times per day. The patient states that diarrhea does not awaken her from sleep. Pertinent negatives include no abdominal pain, bloating, chills, fever, increased  flatus, vomiting or weight loss. Treatments tried: has been taking fiber supplements for years. The treatment provided no relief.  Dizziness This is a new problem. The problem occurs intermittently (only when she leans over and then stands up too quickly). Pertinent negatives include no abdominal pain, chest pain, chills, fatigue, fever, nausea or vomiting.    Lab Results  Component Value Date   NA 137 01/04/2023   K 3.5 01/04/2023   CO2 25 01/04/2023   GLUCOSE 164 (H) 01/04/2023   BUN 25 01/04/2023   CREATININE 0.78 01/04/2023   CALCIUM 9.4 01/04/2023   EGFR 77 01/04/2023   GFRNONAA 54 (L) 11/23/2020   Lab Results  Component Value Date   CHOL 162 01/04/2023   HDL 60 01/04/2023   LDLCALC 52 01/04/2023   LDLDIRECT 138.3 09/26/2011   TRIG 330 (H) 01/04/2023   CHOLHDL 2.7 01/04/2023   Lab Results  Component Value Date   TSH 3.330 08/31/2022   Lab Results  Component Value Date   HGBA1C 7.1 (H) 01/04/2023   Lab Results  Component Value Date   WBC 6.5 08/31/2022   HGB 12.2 08/31/2022   HCT 35.7 08/31/2022   MCV 92 08/31/2022   PLT CANCELED 08/31/2022   Lab Results  Component Value Date   ALT 21 01/04/2023   AST 22 01/04/2023   ALKPHOS 90 01/04/2023   BILITOT 0.7 01/04/2023   No results found for: "25OHVITD2", "25OHVITD3", "VD25OH"    Review of Systems  Constitutional:  Negative for chills, fatigue, fever and weight loss.  HENT:  Negative for trouble swallowing.   Respiratory:  Negative for chest tightness and shortness of breath.   Cardiovascular:  Negative for chest pain and palpitations.  Gastrointestinal:  Positive for diarrhea. Negative for abdominal pain, anal bleeding, bloating, blood in stool, flatus, nausea and vomiting.  Neurological:  Positive for dizziness.  Psychiatric/Behavioral:  Negative for dysphoric mood and sleep disturbance. The patient is not nervous/anxious.     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   LVH (left ventricular hypertrophy) due to hypertensive disease, without heart failure 04/11/2019   Ganglion cyst of left foot 08/29/2018   Xerosis of skin 08/29/2018   Colon cancer screening    History of endometrial cancer 08/03/2016   Type II diabetes mellitus with complication ([place]) [id number]   Acid reflux 10/02/2015   Hyperlipidemia associated with type 2 diabetes mellitus ([place]) 04/08/2015   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   Bilateral carotid artery stenosis 03/26/2015   Anxiety    Environmental and seasonal allergies    Postmenopausal atrophic  vaginitis    Depression, major, recurrent, moderate (HCC)    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 Rosaria Ferries, eyelid lift   POLYPECTOMY  09/22/2017   Procedure: POLYPECTOMY;  Surgeon: Lucilla Lame, MD;  Location: [facility];  Service: Gastroenterology;;   TUBAL LIGATION      Social History   Tobacco Use   Smoking status: Former    Packs/day: 0.25    Years: 20.00    Additional pack years: 0.00    Total pack years: 5.00    Types: Cigarettes    Quit date: 09/25/1988    Years since quitting: 34.4   Smokeless tobacco: Never  Vaping Use   Vaping Use: Never used  Substance Use Topics   Alcohol use: Yes    Alcohol/week: 6.0 standard drinks of alcohol    Types: 2 Glasses of wine, 2 Cans of beer, 2 Shots of liquor per week   Drug use: Never     Medication list has been reviewed and updated.  Current Meds  Medication Sig   allopurinol (ZYLOPRIM) 100 MG tablet TAKE (1) TABLET BY MOUTH EVERY DAY   amLODipine (NORVASC) 5 MG tablet Take 1 tablet (5 mg total) by mouth 2 (two) times daily.   aspirin 81 MG tablet Take 81 mg by mouth daily.   atorvastatin (LIPITOR) 20 MG tablet Take 1 tablet (20 mg total) by mouth daily.   carvedilol (COREG) 12.5 MG tablet Take 1 tablet (12.5 mg total) by mouth 2 (two) times daily.   diclofenac (VOLTAREN) 50 MG EC tablet Take 1 tablet (50 mg total) by mouth 2 (two) times daily as needed.   Multiple Vitamin (MULTIVITAMIN) capsule Take 1 capsule by mouth daily.   ONETOUCH ULTRA test strip TEST TWICE DAILY   pantoprazole (PROTONIX) 40 MG tablet Take 1 tablet (40 mg total) by mouth daily.   psyllium (METAMUCIL SMOOTH TEXTURE) 58.6 % powder Please use one does every other day.   sitaGLIPtin (JANUVIA) 100 MG tablet Take 1 tablet (100 mg total) by mouth daily.   telmisartan-hydrochlorothiazide (MICARDIS HCT) 80-25 MG tablet Take 1 tablet by mouth daily.   venlafaxine XR (EFFEXOR-XR) 150 MG 24 hr capsule Take 1  capsule (150 mg total) by mouth daily with breakfast.   VITAMIN D PO Take 5,000 Units by mouth daily.       01/04/2023   10:32 AM 08/31/2022   10:59 AM 07/12/2022    8:54 AM 06/23/2022   11:05 AM  GAD 7 : Generalized Anxiety Score  Nervous, Anxious, on Edge 0 1 1 1   Control/stop worrying 0 1 1 1   Worry too much - different things 0 1 1 1   Trouble relaxing 0 1 1 1   Restless 0 1 1 1   Easily annoyed or irritable 1 1 1 1   Afraid - awful might happen 0 2 1 1   Total GAD 7 Score 1 8 7 7   Anxiety Difficulty Not difficult at all  Very difficult Somewhat difficult Somewhat difficult       02/16/2023   11:01 AM 01/04/2023   10:31 AM 08/31/2022   10:59 AM  Depression screen PHQ 2/9  Decreased Interest 0 2 1  Down, Depressed, Hopeless 1 2 1   PHQ - 2 Score 1 4 2   Altered sleeping 1 2 2   Tired, decreased energy 1 0 1  Change in appetite 0 0 1  Feeling bad or failure about yourself  0 0 1  Trouble concentrating 0 0 0  Moving slowly or fidgety/restless 0 0 1  Suicidal thoughts 0 0 0  PHQ-9 Score 3 6 8   Difficult doing work/chores Not difficult at all Not difficult at all Not difficult at all    BP Readings from Last 3 Encounters:  03/15/23 (!) 126/50  01/04/23 (!) 128/54  08/31/22 (!) 126/50    Physical Exam Vitals and nursing note reviewed.  Constitutional:      General: She is not in acute distress.    Appearance: Normal appearance. She is well-developed.  HENT:     Head: Normocephalic and atraumatic.  Cardiovascular:     Rate and Rhythm: Normal rate and regular rhythm.  Pulmonary:     Effort: Pulmonary effort is normal. No respiratory distress.     Breath sounds: No wheezing or rhonchi.  Abdominal:     Palpations: Abdomen is soft.     Tenderness: There is no abdominal tenderness.  Musculoskeletal:     Cervical back: Normal range of motion.  Skin:    General: Skin is warm and dry.     Capillary Refill: Capillary refill takes less than 2 seconds.     Findings: No rash.   Neurological:     General: No focal deficit present.     Mental Status: She is alert and oriented to person, place, and time.  Psychiatric:        Mood and Affect: Mood normal.        Behavior: Behavior normal.     Wt Readings from Last 3 Encounters:  03/15/23 167 lb (75.8 kg)  02/16/23 167 lb (75.8 kg)  01/04/23 167 lb (75.8 kg)    BP (!) 126/50 (BP Location: Right Arm, Cuff Size: Large)   Pulse 69   Ht 5\' 2"  (1.575 m)   Wt 167 lb (75.8 kg)   SpO2 98%   BMI 30.54 kg/m   Assessment and Plan:  Problem List Items Addressed This Visit       Digestive   Irritable bowel syndrome with both constipation and diarrhea - Primary    Suspected IBS with normal colonoscopy 2018 Symptoms have been on and off for years Recommend stopping fiber supplements and monitoring symptoms       Other Visit Diagnoses     Orthostatic dizziness       She is advised to hydrate daily and avoid rapid changes in position   Fear of flying       wants to take a trip to [place] and may need meds to reduce anxiety  will call when the time comes       No follow-ups on file.   Partially dictated using [place], any errors are not intentional.  Glean Hess, MD [place], [place]

## 2023-03-20 ENCOUNTER — Other Ambulatory Visit: Payer: Self-pay | Admitting: Internal Medicine

## 2023-03-20 DIAGNOSIS — E1169 Type 2 diabetes mellitus with other specified complication: Secondary | ICD-10-CM

## 2023-03-20 DIAGNOSIS — M10079 Idiopathic gout, unspecified ankle and foot: Secondary | ICD-10-CM

## 2023-03-21 NOTE — Telephone Encounter (Signed)
Requested medication (s) are due for refill today: yes  Requested medication (s) are on the active medication list: yes    Last refill: 12/02/22  #90 0 refills  Future visit scheduled yes 05/05/23  Notes to clinic: Failed due to labs, please review. Thank you  Requested Prescriptions  Pending Prescriptions Disp Refills   allopurinol (ZYLOPRIM) 100 MG tablet [Pharmacy Med Name: ALLOPURINOL TAB 100MG ] 90 tablet 0    Sig: TAKE (1) TABLET BY MOUTH EVERY DAY     Endocrinology:  Gout Agents - allopurinol Failed - 03/20/2023  9:49 AM      Failed - Uric Acid in normal range and within 360 days    Uric Acid  Date Value Ref Range Status  10/12/2018 7.0 2.5 - 7.1 mg/dL Final    Comment:               Therapeutic target for gout patients: <6.0         Passed - Cr in normal range and within 360 days    Creatinine, Ser  Date Value Ref Range Status  01/04/2023 0.78 0.57 - 1.00 mg/dL Final         Passed - Valid encounter within last 12 months    Recent Outpatient Visits           6 days ago Irritable bowel syndrome with both constipation and diarrhea   [facility] at [facility], Jesse Sans, MD   2 months ago Type II diabetes mellitus with complication [facility])   [place] at [facility], Jesse Sans, MD   6 months ago Annual physical exam   [place] at [facility], Jesse Sans, MD   8 months ago [place] at [facility], Jesse Sans, MD   9 months ago Essential hypertension   [place] at [facility], Jesse Sans, MD       Future Appointments             In 1 month Army Melia, Jesse Sans, MD [place] at [facility], [facility]   In 5 months Glean Hess, MD [place] at [facility], [place] within normal limits and completed in the last 12 months    WBC  Date Value Ref Range Status  08/31/2022 6.5 3.4 - 10.8 x10E3/uL Final   RBC  Date Value Ref Range Status  08/31/2022 3.89 3.77 - 5.28 x10E6/uL Final   Hemoglobin  Date Value Ref Range Status  08/31/2022 12.2 11.1 - 15.9 g/dL Final   Hematocrit  Date Value Ref Range Status  08/31/2022 35.7 34.0 - 46.6 % Final   MCHC  Date Value Ref Range Status  08/31/2022 34.2 31.5 - 35.7 g/dL Final   [facility]  Date Value Ref Range Status  08/31/2022 31.4 26.6 - 33.0 pg Final   MCV  Date Value Ref Range Status  08/31/2022 92 79 - 97 fL Final   No results found for: "PLTCOUNTKUC", "LABPLAT", "POCPLA" RDW  Date Value Ref Range Status  08/31/2022 12.6 11.7 - 15.4 % Final         Refused Prescriptions Disp Refills   atorvastatin (LIPITOR) 20 MG tablet [Pharmacy Med Name:  ATORVASTATIN TAB 20MG ] 90 tablet 1    Sig: TAKE 1 TABLET BY MOUTH DAILY     Cardiovascular:  Antilipid - Statins Failed - 03/20/2023  9:49 AM      Failed - Lipid Panel in normal range within the last 12 months    Cholesterol, Total  Date Value Ref Range Status  01/04/2023 162 100 - 199 mg/dL Final   LDL Chol Calc (NIH)  Date Value Ref Range Status  01/04/2023 52 0 - 99 mg/dL Final   Direct LDL  Date Value Ref Range Status  09/26/2011 138.3 mg/dL Final    Comment:    Optimal:  <100 mg/dLNear or Above Optimal:  100-129 mg/dLBorderline High:  130-159 mg/dLHigh:  160-189 mg/dLVery High:  >190 mg/dL   HDL  Date Value Ref Range Status  01/04/2023 60 >39 mg/dL Final   Triglycerides  Date Value Ref Range Status  01/04/2023 330 (H) 0 - 149 mg/dL Final         Passed - Patient is not pregnant      Passed - Valid encounter within last 12 months    Recent Outpatient Visits           6 days ago Irritable bowel syndrome with both constipation and diarrhea   [facility], Jesse Sans, MD   2 months ago Type II diabetes mellitus with complication [facility])   [place] at [facility], Jesse Sans, MD   6 months ago Annual physical exam   [place] at [facility], Jesse Sans, MD   8 months ago [place] at [facility], Jesse Sans, MD   9 months ago Essential hypertension   [place] at [facility], Jesse Sans, MD       Future Appointments             In 1 month Army Melia, Jesse Sans, MD [place] at [facility]   In 5 months Army Melia, Jesse Sans, MD [place] at [facility], [facility]

## 2023-03-21 NOTE — Telephone Encounter (Signed)
Requested Prescriptions  Pending Prescriptions Disp Refills   atorvastatin (LIPITOR) 20 MG tablet [Pharmacy Med Name: ATORVASTATIN TAB 20MG ] 90 tablet 1    Sig: TAKE 1 TABLET BY MOUTH DAILY     Cardiovascular:  Antilipid - Statins Failed - 03/20/2023  9:49 AM      Failed - Lipid Panel in normal range within the last 12 months    Cholesterol, Total  Date Value Ref Range Status  01/04/2023 162 100 - 199 mg/dL Final   LDL Chol Calc (NIH)  Date Value Ref Range Status  01/04/2023 52 0 - 99 mg/dL Final   Direct LDL  Date Value Ref Range Status  09/26/2011 138.3 mg/dL Final    Comment:    Optimal:  <100 mg/dLNear or Above Optimal:  100-129 mg/dLBorderline High:  130-159 mg/dLHigh:  160-189 mg/dLVery High:  >190 mg/dL   HDL  Date Value Ref Range Status  01/04/2023 60 >39 mg/dL Final   Triglycerides  Date Value Ref Range Status  01/04/2023 330 (H) 0 - 149 mg/dL Final         Passed - Patient is not pregnant      Passed - Valid encounter within last 12 months    Recent Outpatient Visits           6 days ago Irritable bowel syndrome with both constipation and diarrhea   [place] [facility] at [facility], Jesse Sans, MD   2 months ago Type II diabetes mellitus with complication [facility])   [place] at [facility], Jesse Sans, MD   6 months ago Annual physical exam   [place] at [facility], Jesse Sans, MD   8 months ago [place] at [facility], Jesse Sans, MD   9 months ago Essential hypertension   [place] at [facility], Jesse Sans, MD       Future Appointments             In 1 month Army Melia Jesse Sans, MD [place] at [facility], [facility]   In 5 months Glean Hess, MD [place] at  [place], [place]             allopurinol (ZYLOPRIM) 100 MG tablet [Pharmacy Med Name: ALLOPURINOL TAB 100MG ] 90 tablet 0    Sig: TAKE (1) TABLET BY [place]     Endocrinology:  Gout Agents - allopurinol Failed - 03/20/2023  9:49 AM      Failed - Uric Acid in normal range and within 360 days    Uric Acid  Date Value Ref Range Status  10/12/2018 7.0 2.5 - 7.1 mg/dL Final    Comment:               Therapeutic target for gout patients: <6.0         Passed - Cr in normal range and within 360 days    Creatinine, Ser  Date Value Ref Range Status  01/04/2023 0.78 0.57 - 1.00 mg/dL Final         Passed - Valid encounter within last 12 months    Recent Outpatient Visits           6 days ago Irritable bowel syndrome with both constipation and diarrhea  [facility] at [facility], Jesse Sans, MD   2 months ago Type II diabetes mellitus with complication [facility])   [place] [facility] at [facility], Jesse Sans, MD   6 months ago Annual physical exam   [place] at [facility], Jesse Sans, MD   8 months ago [place] at [facility], Jesse Sans, MD   9 months ago Essential hypertension   [place] [facility] at [facility], Jesse Sans, MD       Future Appointments             In 1 month Army Melia, Jesse Sans, MD [place] at [facility], [facility]   In 5 months Glean Hess, MD [facility] at [facility] [place] within normal limits and completed in the last 12 months    WBC  Date Value Ref Range Status  08/31/2022 6.5 3.4 - 10.8 x10E3/uL Final   RBC  Date Value Ref Range Status  08/31/2022 3.89 3.77 - 5.28 x10E6/uL Final   Hemoglobin  Date Value Ref Range Status   08/31/2022 12.2 11.1 - 15.9 g/dL Final   Hematocrit  Date Value Ref Range Status  08/31/2022 35.7 34.0 - 46.6 % Final   MCHC  Date Value Ref Range Status  08/31/2022 34.2 31.5 - 35.7 g/dL Final   [facility]  Date Value Ref Range Status  08/31/2022 31.4 26.6 - 33.0 pg Final   MCV  Date Value Ref Range Status  08/31/2022 92 79 - 97 fL Final   No results found for: "PLTCOUNTKUC", "LABPLAT", "POCPLA" RDW  Date Value Ref Range Status  08/31/2022 12.6 11.7 - 15.4 % Final

## 2023-03-30 ENCOUNTER — Other Ambulatory Visit: Payer: Self-pay

## 2023-03-30 ENCOUNTER — Encounter: Payer: Self-pay | Admitting: Internal Medicine

## 2023-03-30 DIAGNOSIS — K582 Mixed irritable bowel syndrome: Secondary | ICD-10-CM

## 2023-04-03 ENCOUNTER — Encounter: Payer: Self-pay | Admitting: Internal Medicine

## 2023-04-03 ENCOUNTER — Ambulatory Visit (INDEPENDENT_AMBULATORY_CARE_PROVIDER_SITE_OTHER): Payer: Medicare HMO | Admitting: Internal Medicine

## 2023-04-03 VITALS — BP 154/78 | HR 84 | Ht 62.0 in | Wt 168.0 lb

## 2023-04-03 DIAGNOSIS — B0230 Zoster ocular disease, unspecified: Secondary | ICD-10-CM

## 2023-04-03 MED ORDER — VALACYCLOVIR HCL 1 G PO TABS: 1000.0000 mg | ORAL_TABLET | Freq: Three times a day (TID) | ORAL | 0 refills | Status: AC

## 2023-04-03 NOTE — Progress Notes (Signed)
Date:  04/03/2023   Name:  Samantha Lang   DOB:  05-Dec-1941   MRN:  [id number]   Chief Complaint: Rash (Woke up yesterday and her left eye brow and above it was hurting. She thought she hit her head and forgot about it. Now she has a rash showing up above left eye brow. Her left eye is hurting her too. She feels as though she has shingles. )  Rash This is a new problem. The current episode started yesterday. The problem has been gradually worsening since onset. Location: scalp and over left eye; eye feels scratchy. Associated symptoms include eye pain. Pertinent negatives include no fatigue.    Lab Results  Component Value Date   NA 137 01/04/2023   K 3.5 01/04/2023   CO2 25 01/04/2023   GLUCOSE 164 (H) 01/04/2023   BUN 25 01/04/2023   CREATININE 0.78 01/04/2023   CALCIUM 9.4 01/04/2023   EGFR 77 01/04/2023   GFRNONAA 54 (L) 11/23/2020   Lab Results  Component Value Date   CHOL 162 01/04/2023   HDL 60 01/04/2023   LDLCALC 52 01/04/2023   LDLDIRECT 138.3 09/26/2011   TRIG 330 (H) 01/04/2023   CHOLHDL 2.7 01/04/2023   Lab Results  Component Value Date   TSH 3.330 08/31/2022   Lab Results  Component Value Date   HGBA1C 7.1 (H) 01/04/2023   Lab Results  Component Value Date   WBC 6.5 08/31/2022   HGB 12.2 08/31/2022   HCT 35.7 08/31/2022   MCV 92 08/31/2022   PLT CANCELED 08/31/2022   Lab Results  Component Value Date   ALT 21 01/04/2023   AST 22 01/04/2023   ALKPHOS 90 01/04/2023   BILITOT 0.7 01/04/2023   No results found for: "25OHVITD2", "25OHVITD3", "VD25OH"   Review of Systems  Constitutional:  Negative for chills, diaphoresis and fatigue.  Eyes:  Positive for pain.  Skin:  Positive for rash.    Patient Active Problem List   Diagnosis Date Noted   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   Nodule of apex of left lung 09/14/2021   Biceps tendinitis of right upper extremity 09/02/2021   Right rotator cuff tear  arthropathy 11/23/2020   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   LVH (left ventricular hypertrophy) due to hypertensive disease, without heart failure 04/11/2019   Ganglion cyst of left foot 08/29/2018   Xerosis of skin 08/29/2018   Colon cancer screening    History of endometrial cancer 08/03/2016   Type II diabetes mellitus with complication 03/14/2016   Acid reflux 10/02/2015   Hyperlipidemia associated with type 2 diabetes mellitus 04/08/2015   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   Bilateral carotid artery stenosis 03/26/2015   Anxiety    Environmental and seasonal allergies    Postmenopausal atrophic vaginitis    Depression, major, recurrent, moderate    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 Shirlee Limerick, eyelid lift  POLYPECTOMY  09/22/2017   Procedure: POLYPECTOMY;  Surgeon: Midge MiniumWohl, Darren, MD;  [facility];  Service: Gastroenterology;;   TUBAL LIGATION      Social History   Tobacco Use   Smoking status: Former    Packs/day: 0.25    Years: 20.00    Additional pack years: 0.00    Total pack years: 5.00    Types: Cigarettes    Quit date: 09/25/1988    Years since quitting: 34.5   Smokeless tobacco: Never  Vaping Use   Vaping Use: Never used  Substance Use Topics   Alcohol use: Yes    Alcohol/week: 6.0 standard drinks of alcohol    Types: 2 Glasses of wine, 2 Cans of beer, 2 Shots of liquor per week   Drug use: Never     Medication list has been  reviewed and updated.  Current Meds  Medication Sig   allopurinol (ZYLOPRIM) 100 MG tablet TAKE (1) TABLET BY MOUTH EVERY DAY   amLODipine (NORVASC) 5 MG tablet Take 1 tablet (5 mg total) by mouth 2 (two) times daily.   aspirin 81 MG tablet Take 81 mg by mouth daily.   atorvastatin (LIPITOR) 20 MG tablet Take 1 tablet (20 mg total) by mouth daily.   carvedilol (COREG) 12.5 MG tablet Take 1 tablet (12.5 mg total) by mouth 2 (two) times daily.   diclofenac (VOLTAREN) 50 MG EC tablet Take 1 tablet (50 mg total) by mouth 2 (two) times daily as needed.   Multiple Vitamin (MULTIVITAMIN) capsule Take 1 capsule by mouth daily.   ONETOUCH ULTRA test strip TEST TWICE DAILY   pantoprazole (PROTONIX) 40 MG tablet Take 1 tablet (40 mg total) by mouth daily.   psyllium (METAMUCIL SMOOTH TEXTURE) 58.6 % powder Please use one does every other day.   sitaGLIPtin (JANUVIA) 100 MG tablet Take 1 tablet (100 mg total) by mouth daily.   telmisartan-hydrochlorothiazide (MICARDIS HCT) 80-25 MG tablet Take 1 tablet by mouth daily.   valACYclovir (VALTREX) 1000 MG tablet Take 1 tablet (1,000 mg total) by mouth 3 (three) times daily for 10 days.   venlafaxine XR (EFFEXOR-XR) 150 MG 24 hr capsule Take 1 capsule (150 mg total) by mouth daily with breakfast.   VITAMIN D PO Take 5,000 Units by mouth daily.       04/03/2023    9:07 AM 01/04/2023   10:32 AM 08/31/2022   10:59 AM 07/12/2022    8:54 AM  GAD 7 : Generalized Anxiety Score  Nervous, Anxious, on Edge 2 0 1 1  Control/stop worrying 2 0 1 1  Worry too much - different things 2 0 1 1  Trouble relaxing 2 0 1 1  Restless 2 0 1 1  Easily annoyed or irritable 1 1 1 1   Afraid - awful might happen 1 0 2 1  Total GAD 7 Score 12 1 8 7   Anxiety Difficulty Somewhat difficult Not difficult at all Very difficult Somewhat difficult       04/03/2023    9:07 AM 02/16/2023   11:01 AM 01/04/2023   10:31 AM  Depression screen PHQ 2/9  Decreased Interest 2 0 2  Down,  Depressed, Hopeless 1 1 2   PHQ - 2 Score 3 1 4   Altered sleeping 1 1 2   Tired, decreased energy 0 1 0  Change in appetite 1 0 0  Feeling bad or failure about yourself  1 0 0  Trouble concentrating 1 0 0  Moving slowly or  fidgety/restless 0 0 0  Suicidal thoughts 0 0 0  PHQ-9 Score 7 3 6   Difficult doing work/chores Very difficult Not difficult at all Not difficult at all    BP Readings from Last 3 Encounters:  04/03/23 (!) 154/78  03/15/23 (!) 126/50  01/04/23 (!) 128/54    Physical Exam Vitals and nursing note reviewed.  Constitutional:      General: She is not in acute distress.    Appearance: She is well-developed.  HENT:     Head: Normocephalic and atraumatic.      Comments: Red slightly raised lesion, no vesicle yet No lesion on tip of nose Eyes:     Extraocular Movements: Extraocular movements intact.     Conjunctiva/sclera: Conjunctivae normal.  Pulmonary:     Effort: Pulmonary effort is normal. No respiratory distress.  Skin:    General: Skin is warm and dry.     Findings: No rash.  Neurological:     Mental Status: She is alert and oriented to person, place, and time.  Psychiatric:        Mood and Affect: Mood normal.        Behavior: Behavior normal.     Wt Readings from Last 3 Encounters:  04/03/23 168 lb (76.2 kg)  03/15/23 167 lb (75.8 kg)  02/16/23 167 lb (75.8 kg)    BP (!) 154/78   Pulse 84   Ht 5\' 2"  (1.575 m)   Wt 168 lb (76.2 kg)   SpO2 97%   BMI 30.73 kg/m   Assessment and Plan:  Problem List Items Addressed This Visit   None Visit Diagnoses     Herpes zoster with ophthalmic complication, unspecified herpes zoster eye disease    -  Primary   Begin Valtrex immediately Urgent referral sent   Relevant Medications   valACYclovir (VALTREX) 1000 MG tablet   Other Relevant Orders   Ambulatory referral to Ophthalmology       No follow-ups on file.   Partially dictated using Dragon software, any errors are not  intentional.  Reubin Milan, MD [facility] [place], [place]

## 2023-04-19 DIAGNOSIS — R600 Localized edema: Secondary | ICD-10-CM | POA: Diagnosis not present

## 2023-04-19 DIAGNOSIS — E782 Mixed hyperlipidemia: Secondary | ICD-10-CM | POA: Diagnosis not present

## 2023-04-19 DIAGNOSIS — I6523 Occlusion and stenosis of bilateral carotid arteries: Secondary | ICD-10-CM | POA: Diagnosis not present

## 2023-04-19 DIAGNOSIS — I10 Essential (primary) hypertension: Secondary | ICD-10-CM | POA: Diagnosis not present

## 2023-04-19 DIAGNOSIS — I350 Nonrheumatic aortic (valve) stenosis: Secondary | ICD-10-CM | POA: Diagnosis not present

## 2023-04-19 DIAGNOSIS — I119 Hypertensive heart disease without heart failure: Secondary | ICD-10-CM | POA: Diagnosis not present

## 2023-04-19 DIAGNOSIS — E119 Type 2 diabetes mellitus without complications: Secondary | ICD-10-CM | POA: Diagnosis not present

## 2023-04-26 ENCOUNTER — Other Ambulatory Visit: Payer: Self-pay | Admitting: Internal Medicine

## 2023-04-26 DIAGNOSIS — F331 Major depressive disorder, recurrent, moderate: Secondary | ICD-10-CM

## 2023-04-26 DIAGNOSIS — K219 Gastro-esophageal reflux disease without esophagitis: Secondary | ICD-10-CM

## 2023-05-05 ENCOUNTER — Encounter: Payer: Self-pay | Admitting: Internal Medicine

## 2023-05-05 ENCOUNTER — Ambulatory Visit (INDEPENDENT_AMBULATORY_CARE_PROVIDER_SITE_OTHER): Payer: Medicare HMO | Admitting: Internal Medicine

## 2023-05-05 VITALS — BP 128/76 | HR 67 | Ht 62.0 in | Wt 168.0 lb

## 2023-05-05 DIAGNOSIS — E118 Type 2 diabetes mellitus with unspecified complications: Secondary | ICD-10-CM

## 2023-05-05 DIAGNOSIS — E785 Hyperlipidemia, unspecified: Secondary | ICD-10-CM | POA: Diagnosis not present

## 2023-05-05 DIAGNOSIS — Z7984 Long term (current) use of oral hypoglycemic drugs: Secondary | ICD-10-CM

## 2023-05-05 DIAGNOSIS — I10 Essential (primary) hypertension: Secondary | ICD-10-CM

## 2023-05-05 DIAGNOSIS — E1169 Type 2 diabetes mellitus with other specified complication: Secondary | ICD-10-CM | POA: Diagnosis not present

## 2023-05-05 LAB — POCT GLYCOSYLATED HEMOGLOBIN (HGB A1C): Hemoglobin A1C: 7 % — AB (ref 4.0–5.6)

## 2023-05-05 MED ORDER — CARVEDILOL 12.5 MG PO TABS: 12.5000 mg | ORAL_TABLET | Freq: Two times a day (BID) | ORAL | 1 refills | Status: DC

## 2023-05-05 MED ORDER — TELMISARTAN-HCTZ 80-25 MG PO TABS: 1.0000 | ORAL_TABLET | Freq: Every day | ORAL | 1 refills | Status: DC

## 2023-05-05 MED ORDER — ATORVASTATIN CALCIUM 20 MG PO TABS: 20.0000 mg | ORAL_TABLET | Freq: Every day | ORAL | 1 refills | Status: DC

## 2023-05-05 MED ORDER — AMLODIPINE BESYLATE 5 MG PO TABS: 5.0000 mg | ORAL_TABLET | Freq: Two times a day (BID) | ORAL | 1 refills | Status: DC

## 2023-05-05 NOTE — Assessment & Plan Note (Signed)
Stable exam with well controlled BP.  Currently taking amlodipine, coreg, telmisartin hct. Tolerating medications without concerns or side effects. Will continue to recommend low sodium diet and current regimen.

## 2023-05-05 NOTE — Assessment & Plan Note (Addendum)
Atorvastatin 20 mg since last December - no side effects. Lab Results  Component Value Date   LDLCALC 52 01/04/2023  Will continue current dose.

## 2023-05-05 NOTE — Assessment & Plan Note (Addendum)
Blood sugars stable without hypoglycemic symptoms or events. Currently being treated with Januvia. Lab Results  Component Value Date   HGBA1C 7.1 (H) 01/04/2023  A1C today is 7.0.  continue same medications, diet and exercise as able.

## 2023-05-05 NOTE — Progress Notes (Signed)
Date:  05/05/2023   Name:  Samantha Lang   DOB:  [birth year]   MRN:  [id number]   Chief Complaint: Diabetes and Hypertension  Diabetes She presents for her follow-up diabetic visit. She has type 2 diabetes mellitus. Her disease course has been stable. Pertinent negatives for hypoglycemia include no headaches or tremors. Pertinent negatives for diabetes include no chest pain, no fatigue, no polydipsia and no polyuria. An ACE inhibitor/angiotensin II receptor blocker is being taken.  Hypertension This is a chronic problem. The problem is controlled. Pertinent negatives include no chest pain, headaches, palpitations or shortness of breath. Past treatments include angiotensin blockers, diuretics, beta blockers and calcium channel blockers. The current treatment provides significant improvement.    Lab Results  Component Value Date   NA 137 01/04/2023   K 3.5 01/04/2023   CO2 25 01/04/2023   GLUCOSE 164 (H) 01/04/2023   BUN 25 01/04/2023   CREATININE 0.78 01/04/2023   CALCIUM 9.4 01/04/2023   EGFR 77 01/04/2023   GFRNONAA 54 (L) 11/23/2020   Lab Results  Component Value Date   CHOL 162 01/04/2023   HDL 60 01/04/2023   LDLCALC 52 01/04/2023   LDLDIRECT 138.3 09/26/2011   TRIG 330 (H) 01/04/2023   CHOLHDL 2.7 01/04/2023   Lab Results  Component Value Date   TSH 3.330 08/31/2022   Lab Results  Component Value Date   HGBA1C 7.0 (A) 05/05/2023   Lab Results  Component Value Date   WBC 6.5 08/31/2022   HGB 12.2 08/31/2022   HCT 35.7 08/31/2022   MCV 92 08/31/2022   PLT CANCELED 08/31/2022   Lab Results  Component Value Date   ALT 21 01/04/2023   AST 22 01/04/2023   ALKPHOS 90 01/04/2023   BILITOT 0.7 01/04/2023   No results found for: "25OHVITD2", "25OHVITD3", "VD25OH"   Review of Systems  Constitutional:  Negative for appetite change, fatigue, fever and unexpected weight change.  HENT:  Negative for tinnitus and trouble swallowing.   Eyes:  Negative for visual  disturbance.  Respiratory:  Negative for cough, chest tightness and shortness of breath.   Cardiovascular:  Negative for chest pain, palpitations and leg swelling.  Gastrointestinal:  Negative for abdominal pain.  Endocrine: Negative for polydipsia and polyuria.  Genitourinary:  Negative for dysuria and hematuria.  Musculoskeletal:  Negative for arthralgias.  Neurological:  Negative for tremors, numbness and headaches.  Psychiatric/Behavioral:  Negative for dysphoric mood.     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   LVH (left ventricular hypertrophy) due to hypertensive disease, without heart failure 04/11/2019   Ganglion cyst of left foot 08/29/2018   Xerosis of skin 08/29/2018   Colon cancer screening    History of endometrial cancer 08/03/2016   Type II diabetes mellitus with complication (HCC) 03/14/2016   Acid reflux 10/02/2015   Hyperlipidemia associated with type 2 diabetes mellitus (HCC) 04/08/2015   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   Bilateral carotid artery stenosis 03/26/2015   Anxiety    Environmental and seasonal allergies    Postmenopausal atrophic vaginitis    Depression, major, recurrent, moderate (HCC)    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 Shirlee Limerick, eyelid lift   POLYPECTOMY  09/22/2017   Procedure: POLYPECTOMY;  Surgeon: Midge Minium, MD;  Location: [facility];  Service: Gastroenterology;;   TUBAL LIGATION      Social History   Tobacco Use   Smoking status: Former    Packs/day: 0.25    Years: 20.00    Additional pack years: 0.00    Total pack years: 5.00    Types: Cigarettes    Quit date: 09/25/1988    Years since quitting: 34.6   Smokeless tobacco: Never  Vaping Use   Vaping Use: Never used  Substance Use Topics   Alcohol use: Yes    Alcohol/week: 6.0 standard drinks of alcohol    Types: 2 Glasses of wine, 2 Cans of beer, 2 Shots of liquor per week   Drug use: Never     Medication list has been reviewed and updated.  Current Meds  Medication Sig   allopurinol (ZYLOPRIM) 100 MG tablet TAKE (1) TABLET BY MOUTH EVERY DAY   aspirin 81 MG tablet Take 81 mg by mouth daily.   diclofenac (VOLTAREN) 50 MG EC tablet Take 1 tablet (50 mg total) by mouth 2 (two) times daily as needed.   Multiple Vitamin (MULTIVITAMIN) capsule Take 1 capsule by mouth daily.   ONETOUCH ULTRA test strip TEST TWICE DAILY   pantoprazole (PROTONIX) 40 MG tablet TAKE ONE (1) TABLET BY MOUTH ONCE DAILY   psyllium (METAMUCIL SMOOTH TEXTURE) 58.6 % powder Please use one does every other day.   sitaGLIPtin (JANUVIA) 100 MG tablet Take 1 tablet (100 mg total) by mouth daily.   venlafaxine XR (EFFEXOR-XR) 150 MG 24 hr capsule TAKE 1 CAPSULE BY MOUTH DAILY WITH BREAKFAST.   VITAMIN D PO Take 5,000 Units by mouth daily.   [DISCONTINUED] amLODipine (NORVASC) 5 MG tablet Take 1 tablet (5 mg total) by mouth 2 (two) times daily.   [DISCONTINUED] atorvastatin (LIPITOR) 20 MG tablet Take 1 tablet (20 mg total) by mouth daily.   [DISCONTINUED] carvedilol (COREG) 12.5 MG tablet Take 1 tablet (12.5 mg total) by mouth 2 (two) times daily.   [DISCONTINUED] telmisartan-hydrochlorothiazide  (MICARDIS HCT) 80-25 MG tablet Take 1 tablet by mouth daily.       05/05/2023   10:56 AM 04/03/2023    9:07 AM 01/04/2023   10:32 AM 08/31/2022   10:59 AM  GAD 7 : Generalized Anxiety Score  Nervous, Anxious, on Edge 2 2 0 1  Control/stop worrying 2 2 0 1  Worry too much - different things 2 2 0 1  Trouble relaxing 0 2 0 1  Restless 0 2 0 1  Easily annoyed or irritable 1 1 1 1   Afraid - awful might happen 1 1 0 2  Total GAD 7 Score 8 12 1 8   Anxiety Difficulty Somewhat difficult Somewhat difficult Not difficult at all Very difficult       05/05/2023   10:56 AM 04/03/2023    9:07 AM 02/16/2023   11:01 AM  Depression  screen PHQ 2/9  Decreased Interest 0 2 0  Down, Depressed, Hopeless 0 1 1  PHQ - 2 Score 0 3 1  Altered sleeping 1 1 1   Tired, decreased energy 0 0 1  Change in appetite 2 1 0  Feeling bad or failure about yourself  0 1 0  Trouble concentrating 0 1 0  Moving slowly or fidgety/restless 0 0 0  Suicidal thoughts 0 0 0  PHQ-9 Score 3 7 3   Difficult doing work/chores Very difficult Very difficult Not difficult at all    BP Readings from Last 3 Encounters:  05/05/23 128/76  04/03/23 (!) 154/78  03/15/23 (!) 126/50    Physical Exam Vitals and nursing note reviewed.  Constitutional:      General: She is not in acute distress.    Appearance: Normal appearance. She is well-developed.  HENT:     Head: Normocephalic and atraumatic.  Neck:     Vascular: No carotid bruit.  Cardiovascular:     Rate and Rhythm: Normal rate and regular rhythm.  Pulmonary:     Effort: Pulmonary effort is normal. No respiratory distress.     Breath sounds: No wheezing or rhonchi.  Musculoskeletal:     Cervical back: Normal range of motion and neck supple.     Right lower leg: No edema.     Left lower leg: No edema.  Lymphadenopathy:     Cervical: No cervical adenopathy.  Skin:    General: Skin is warm and dry.     Capillary Refill: Capillary refill takes less than 2 seconds.      Findings: No rash.  Neurological:     General: No focal deficit present.     Mental Status: She is alert and oriented to person, place, and time.  Psychiatric:        Mood and Affect: Mood normal.        Behavior: Behavior normal.     Wt Readings from Last 3 Encounters:  05/05/23 168 lb (76.2 kg)  04/03/23 168 lb (76.2 kg)  03/15/23 167 lb (75.8 kg)    BP 128/76   Pulse 67   Ht 5\' 2"  (1.575 m)   Wt 168 lb (76.2 kg)   SpO2 98%   BMI 30.73 kg/m   Assessment and Plan:  Problem List Items Addressed This Visit       Cardiovascular and Mediastinum   Essential hypertension (Chronic)    Stable exam with well controlled BP.  Currently taking amlodipine, coreg, telmisartin hct. Tolerating medications without concerns or side effects. Will continue to recommend low sodium diet and current regimen.       Relevant Medications   amLODipine (NORVASC) 5 MG tablet   carvedilol (COREG) 12.5 MG tablet   telmisartan-hydrochlorothiazide (MICARDIS HCT) 80-25 MG tablet   atorvastatin (LIPITOR) 20 MG tablet     Endocrine   Hyperlipidemia associated with type 2 diabetes mellitus (HCC) (Chronic)    Atorvastatin 20 mg since last December - no side effects. Lab Results  Component Value Date   LDLCALC 52 01/04/2023  Will continue current dose.      Relevant Medications   amLODipine (NORVASC) 5 MG tablet   carvedilol (COREG) 12.5 MG tablet   telmisartan-hydrochlorothiazide (MICARDIS HCT) 80-25 MG tablet   atorvastatin (LIPITOR) 20 MG tablet   Type II diabetes mellitus with complication (HCC) - Primary (Chronic)    Blood sugars stable without hypoglycemic symptoms or events. Currently being treated with Januvia. Lab Results  Component Value  Date   HGBA1C 7.1 (H) 01/04/2023  A1C today is 7.0.  continue same medications, diet and exercise as able.        Relevant Medications   telmisartan-hydrochlorothiazide (MICARDIS HCT) 80-25 MG tablet   atorvastatin (LIPITOR) 20 MG tablet    Other Relevant Orders   POCT glycosylated hemoglobin (Hb A1C) (Completed)    No follow-ups on file.   Partially dictated using Dragon software, any errors are not intentional.  Reubin Milan, MD [facility] Medicine [place], [place]

## 2023-06-05 ENCOUNTER — Telehealth: Payer: Self-pay | Admitting: Pharmacist

## 2023-06-05 NOTE — Progress Notes (Signed)
   06/05/2023  Patient ID: Samantha Lang, female   DOB: [birth year], 82 y.o.   MRN: [id number]  Pharmacy Quality Measure Review  This patient is appearing on the insurance-provided list related to adherence measure for diabetes medications this calendar year.   Medication: Januvia 100 mg Last fill date: 04/17/2023 for 30 day supply  Was unable to reach patient via telephone today and have left HIPAA compliant voicemail asking patient to return my call.   Estelle Grumbles, PharmD, [facility] [phone]

## 2023-06-26 ENCOUNTER — Other Ambulatory Visit: Payer: Self-pay | Admitting: Internal Medicine

## 2023-06-26 DIAGNOSIS — I10 Essential (primary) hypertension: Secondary | ICD-10-CM

## 2023-07-07 DIAGNOSIS — Z961 Presence of intraocular lens: Secondary | ICD-10-CM | POA: Diagnosis not present

## 2023-07-07 DIAGNOSIS — H04123 Dry eye syndrome of bilateral lacrimal glands: Secondary | ICD-10-CM | POA: Diagnosis not present

## 2023-07-07 DIAGNOSIS — B0230 Zoster ocular disease, unspecified: Secondary | ICD-10-CM | POA: Diagnosis not present

## 2023-07-08 ENCOUNTER — Other Ambulatory Visit: Payer: Self-pay | Admitting: Internal Medicine

## 2023-07-08 DIAGNOSIS — M10079 Idiopathic gout, unspecified ankle and foot: Secondary | ICD-10-CM

## 2023-07-10 NOTE — Telephone Encounter (Signed)
Requested medications are due for refill today.  yes  Requested medications are on the active medications list.  yes  Last refill. 03/21/2023 #90 0 rf  Future visit scheduled.   yes  Notes to clinic.  Labs are expired.    Requested Prescriptions  Pending Prescriptions Disp Refills   allopurinol (ZYLOPRIM) 100 MG tablet [Pharmacy Med Name: ALLOPURINOL TAB 100MG ] 90 tablet 0    Sig: TAKE (1) TABLET BY MOUTH EVERY DAY     Endocrinology:  Gout Agents - allopurinol Failed - 07/08/2023  9:02 AM      Failed - Uric Acid in normal range and within 360 days    Uric Acid  Date Value Ref Range Status  10/12/2018 7.0 2.5 - 7.1 mg/dL Final    Comment:               Therapeutic target for gout patients: <6.0         Passed - Cr in normal range and within 360 days    Creatinine, Ser  Date Value Ref Range Status  01/04/2023 0.78 0.57 - 1.00 mg/dL Final         Passed - Valid encounter within last 12 months    Recent Outpatient Visits           2 months ago Type II diabetes mellitus with complication [facility])   [place] [facility] at [facility], Nyoka Cowden, MD   3 months ago Herpes zoster with ophthalmic complication, unspecified herpes zoster eye disease   [place] [facility] at [facility], Nyoka Cowden, MD   3 months ago Irritable bowel syndrome with both constipation and diarrhea   [place] [facility] at [facility], Nyoka Cowden, MD   6 months ago Type II diabetes mellitus with complication [facility])   [place] [facility] at [facility], Nyoka Cowden, MD   10 months ago Annual physical exam   [facility] at [facility], Nyoka Cowden, MD       Future Appointments             In 1 month Reubin Milan, MD [facility] at [facility]            Passed - CBC within normal  limits and completed in the last 12 months    WBC  Date Value Ref Range Status  08/31/2022 6.5 3.4 - 10.8 x10E3/uL Final   RBC  Date Value Ref Range Status  08/31/2022 3.89 3.77 - 5.28 x10E6/uL Final   Hemoglobin  Date Value Ref Range Status  08/31/2022 12.2 11.1 - 15.9 g/dL Final   Hematocrit  Date Value Ref Range Status  08/31/2022 35.7 34.0 - 46.6 % Final   MCHC  Date Value Ref Range Status  08/31/2022 34.2 31.5 - 35.7 g/dL Final   [facility]  Date Value Ref Range Status  08/31/2022 31.4 26.6 - 33.0 pg Final   MCV  Date Value Ref Range Status  08/31/2022 92 79 - 97 fL Final   No results found for: "PLTCOUNTKUC", "LABPLAT", "POCPLA" RDW  Date Value Ref Range Status  08/31/2022 12.6 11.7 - 15.4 % Final

## 2023-07-25 ENCOUNTER — Ambulatory Visit: Payer: Medicare HMO | Admitting: Gastroenterology

## 2023-07-25 ENCOUNTER — Encounter: Payer: Self-pay | Admitting: Gastroenterology

## 2023-07-25 VITALS — BP 162/75 | HR 72 | Temp 97.5°F | Ht 62.0 in | Wt 166.2 lb

## 2023-07-25 DIAGNOSIS — K529 Noninfective gastroenteritis and colitis, unspecified: Secondary | ICD-10-CM

## 2023-07-25 DIAGNOSIS — R140 Abdominal distension (gaseous): Secondary | ICD-10-CM | POA: Diagnosis not present

## 2023-07-25 NOTE — Progress Notes (Signed)
Arlyss Repress, MD [address]  [address]  [place], [place] [address]  Main: [phone]  Fax: [phone]    Gastroenterology Consultation  Referring Provider:     Reubin Milan, MD Primary Care Physician:  Reubin Milan, MD Primary Gastroenterologist:  Dr. Arlyss Repress Reason for Consultation: Chronic diarrhea        HPI:   Samantha Lang is a 82 y.o. female referred by Dr. Judithann Graves, Nyoka Cowden, MD  for consultation & management of chronic diarrhea.  Patient reports that she was diagnosed with IBS and has been having intermittent episodes of diarrhea.  Over the last 1 year, her diarrheal symptoms have worsened, has watery bowel movements about 3-4 times daily.  Within last 1 week, stools are mushy and only 1 a day.  She does report feeling gassy and significantly bloated.  She denies nocturnal diarrhea.  She was seen by Dr. Maximino Greenland for her symptoms in the past, was advised to take fiber.  It did not help.  Diarrhea is impairing her quality of life, she is afraid of going out due to fear of not making it to the bathroom.  She denies any particular relation to foods.  She does consume bacon and sausage and her breakfast on a daily basis.  She likes to eat sweets.  Does have diabetes  NSAIDs: None  Antiplts/Anticoagulants/Anti thrombotics: None  GI Procedures:  Colonoscopy 09/14/2017 for screening Sigmoid diverticulosis, otherwise normal colon  Past Medical History:  Diagnosis Date   Acquired trigger finger 01/13/2021   Allergy    Anxiety    Aortic valve stenosis, mild    Depression    mild   Diabetes mellitus age 58   GERD (gastroesophageal reflux disease)    Heart murmur    Hemorrhoids    Hyperlipidemia    Hypertension 2003   Incontinence    Female stress   Mitral incompetence    Personal history of chemotherapy    3 treatments   Personal history of radiation therapy    5 treatments   Postmenopausal atrophic vaginitis    Radial styloid  tenosynovitis 01/13/2021   Torn rotator cuff    Trochanteric bursitis of left hip 12/07/2016   Uterine cancer [facility])    treated    Past Surgical History:  Procedure Laterality Date   ABDOMINAL HYSTERECTOMY  07/2016   BREAST BIOPSY Right 03/21/08   right, benign    COLONOSCOPY WITH PROPOFOL N/A 09/22/2017   Procedure: COLONOSCOPY WITH PROPOFOL;  Surgeon: Midge Minium, MD;  Location: [facility];  Service: Gastroenterology;  Laterality: N/A;  diabetic   EYE SURGERY  Oct 2009   for ptosis,  Dr. Shirlee Limerick, eyelid lift   POLYPECTOMY  09/22/2017   Procedure: POLYPECTOMY;  Surgeon: Midge Minium, MD;  Location: [facility];  Service: Gastroenterology;;   TUBAL LIGATION       Current Outpatient Medications:    allopurinol (ZYLOPRIM) 100 MG tablet, TAKE (1) TABLET BY MOUTH EVERY DAY, Disp: 90 tablet, Rfl: 0   amLODipine (NORVASC) 5 MG tablet, Take 1 tablet (5 mg total) by mouth 2 (two) times daily., Disp: 180 tablet, Rfl: 1   aspirin 81 MG tablet, Take 81 mg by mouth daily., Disp: , Rfl:    atorvastatin (LIPITOR) 20 MG tablet, Take 1 tablet (20 mg total) by mouth daily., Disp: 90 tablet, Rfl: 1   carvedilol (COREG) 12.5 MG tablet, Take 1 tablet (12.5 mg total) by mouth 2 (two) times daily.,  Disp: 180 tablet, Rfl: 1   Multiple Vitamin (MULTIVITAMIN) capsule, Take 1 capsule by mouth daily., Disp: , Rfl:    pantoprazole (PROTONIX) 40 MG tablet, TAKE ONE (1) TABLET BY MOUTH ONCE DAILY, Disp: 90 tablet, Rfl: 1   sitaGLIPtin (JANUVIA) 100 MG tablet, Take 1 tablet (100 mg total) by mouth daily., Disp: 100 tablet, Rfl: 1   telmisartan-hydrochlorothiazide (MICARDIS HCT) 80-25 MG tablet, TAKE (1) TABLET BY MOUTH EVERY DAY, Disp: 90 tablet, Rfl: 1   venlafaxine XR (EFFEXOR-XR) 150 MG 24 hr capsule, TAKE 1 CAPSULE BY MOUTH DAILY WITH BREAKFAST., Disp: 90 capsule, Rfl: 1   VITAMIN D PO, Take 5,000 Units by mouth daily., Disp: , Rfl:    Family History  Problem Relation Age of Onset   Breast  cancer Paternal Aunt    Breast cancer Paternal Grandmother 96   Cancer Father 40       lung   Stroke Mother    Hypertension Mother    Cancer Brother        prostate   Depression Brother    Ovarian cancer Neg Hx    Colon cancer Neg Hx    Diabetes Neg Hx      Social History   Tobacco Use   Smoking status: Former    Current packs/day: 0.00    Average packs/day: 0.3 packs/day for 20.0 years (5.0 ttl pk-yrs)    Types: Cigarettes    Start date: 09/25/1968    Quit date: 09/25/1988    Years since quitting: 34.8   Smokeless tobacco: Never  Vaping Use   Vaping status: Never Used  Substance Use Topics   Alcohol use: Yes    Alcohol/week: 6.0 standard drinks of alcohol    Types: 2 Glasses of wine, 2 Cans of beer, 2 Shots of liquor per week    Comment: 2 times a day. 14 a week   Drug use: Never    Allergies as of 07/25/2023 - Review Complete 07/25/2023  Allergen Reaction Noted   Cephalexin Rash 09/26/2011   Clarithromycin Rash 09/26/2011    Review of Systems:    All systems reviewed and negative except where noted in HPI.   Physical Exam:  BP (!) 162/75 (BP Location: Right Arm, Patient Position: Sitting, Cuff Size: Normal)   Pulse 72   Temp (!) 97.5 F (36.4 C) (Oral)   Ht 5\' 2"  (1.575 m)   Wt 166 lb 4 oz (75.4 kg)   BMI 30.41 kg/m  No LMP recorded. Patient has had a hysterectomy.  General:   Alert,  Well-developed, well-nourished, pleasant and cooperative in NAD Head:  Normocephalic and atraumatic. Eyes:  Sclera clear, no icterus.   Conjunctiva pink. Ears:  Normal auditory acuity. Nose:  No deformity, discharge, or lesions. Mouth:  No deformity or lesions,oropharynx pink & moist. Neck:  Supple; no masses or thyromegaly. Lungs:  Respirations even and unlabored.  Clear throughout to auscultation.   No wheezes, crackles, or rhonchi. No acute distress. Heart:  Regular rate and rhythm; no murmurs, clicks, rubs, or gallops. Abdomen:  Normal bowel sounds. Soft, non-tender  and moderately distended, tympanic to percussion without masses, hepatosplenomegaly or hernias noted.  No guarding or rebound tenderness.   Rectal: Not performed Msk:  Symmetrical without gross deformities. Good, equal movement & strength bilaterally. Pulses:  Normal pulses noted. Extremities:  No clubbing or edema.  No cyanosis. Neurologic:  Alert and oriented x3;  grossly normal neurologically. Skin:  Intact without significant lesions or rashes. No jaundice. Psych:  Alert and cooperative. Normal mood and affect.  Imaging Studies: No abdominal imaging  Assessment and Plan:   Samantha Lang is a 82 y.o. female with history of diabetes, hypertension is seen in consultation for more than 1 year history of nonbloody diarrhea with abdominal bloating  Check H. pylori stool antigen, celiac disease panel Check pancreatic fecal elastase levels Trial of IBgard, samples provided Trial of probiotics like Culturelle or Florastor or align Fiber supplements are not the treatment of choice to treat diarrhea Also, discussed with patient regarding upper endoscopy and colonoscopy for further evaluation.  She would like to defer these procedures at this time   Follow up with PA-C, Celso Amy in 4 to 6 weeks   Arlyss Repress, MD

## 2023-07-25 NOTE — Patient Instructions (Signed)
Gave Ibguard samples if they work you can by them over the counter.  Start taking probiotics probiotics you could try are Culturelle, Florastor, or [employer]

## 2023-07-26 ENCOUNTER — Encounter: Payer: Self-pay | Admitting: Gastroenterology

## 2023-08-04 ENCOUNTER — Other Ambulatory Visit: Payer: Self-pay | Admitting: Internal Medicine

## 2023-08-04 DIAGNOSIS — E118 Type 2 diabetes mellitus with unspecified complications: Secondary | ICD-10-CM

## 2023-08-24 ENCOUNTER — Other Ambulatory Visit: Payer: Self-pay | Admitting: Internal Medicine

## 2023-08-24 DIAGNOSIS — Z1231 Encounter for screening mammogram for malignant neoplasm of breast: Secondary | ICD-10-CM

## 2023-08-31 ENCOUNTER — Emergency Department
Admission: EM | Admit: 2023-08-31 | Discharge: 2023-08-31 | Disposition: A | Discharge status: Home / Self Care | Payer: Medicare HMO | Attending: Emergency Medicine | Admitting: Emergency Medicine

## 2023-08-31 ENCOUNTER — Emergency Department: Payer: Medicare HMO

## 2023-08-31 ENCOUNTER — Other Ambulatory Visit: Payer: Self-pay

## 2023-08-31 ENCOUNTER — Encounter: Payer: Self-pay | Admitting: Emergency Medicine

## 2023-08-31 DIAGNOSIS — K5900 Constipation, unspecified: Secondary | ICD-10-CM | POA: Insufficient documentation

## 2023-08-31 DIAGNOSIS — R1084 Generalized abdominal pain: Secondary | ICD-10-CM | POA: Diagnosis not present

## 2023-08-31 MED ORDER — DOCUSATE SODIUM 100 MG PO CAPS
100.0000 mg | ORAL_CAPSULE | Freq: Every day | ORAL | 0 refills | Status: DC
Start: 2023-08-31 — End: 2023-09-18

## 2023-08-31 NOTE — ED Provider Notes (Signed)
[facility] Provider Note    Event Date/Time   First MD Initiated Contact with Patient 08/31/23 1005     (approximate)   History   Constipation   HPI Samantha Lang is a 82 y.o. female with prior IBS history presenting today for constipation.  Patient notes 2 days of constipation where she has been passing mucus but no other fecal matter.  She was having some generalized abdominal pain which does improve with bowel movements.  No abdominal pain at this time.  Denying nausea but does have slightly decreased appetite.  No other infectious symptoms such as fever or chills.  Patient does note chronic history of intermittent constipation and diarrhea.     Physical Exam   Triage Vital Signs: ED Triage Vitals  Encounter Vitals Group     BP 08/31/23 0931 (!) 167/61     Systolic BP Percentile --      Diastolic BP Percentile --      Pulse Rate 08/31/23 0931 65     Resp 08/31/23 0931 16     Temp 08/31/23 0931 98 F (36.7 C)     Temp Source 08/31/23 0931 Oral     SpO2 08/31/23 0931 99 %     Weight 08/31/23 0928 173 lb (78.5 kg)     Height 08/31/23 0928 5\' 2"  (1.575 m)     Head Circumference --      Peak Flow --      Pain Score 08/31/23 0928 0     Pain Loc --      Pain Education --      Exclude from Growth Chart --     Most recent vital signs: Vitals:   08/31/23 0931  BP: (!) 167/61  Pulse: 65  Resp: 16  Temp: 98 F (36.7 C)  SpO2: 99%   Physical Exam: I have reviewed the vital signs and nursing notes. General: Awake, alert, no acute distress.  Nontoxic appearing. Head:  Atraumatic, normocephalic.   ENT:  EOM intact, PERRL. Oral mucosa is pink and moist with no lesions. Neck: Neck is supple with full range of motion, No meningeal signs. Cardiovascular:  RRR, No murmurs. Peripheral pulses palpable and equal bilaterally. Respiratory:  Symmetrical chest wall expansion.  No rhonchi, rales, or wheezes.  Good air movement throughout.  No use of  accessory muscles.   Musculoskeletal:  No cyanosis or edema. Moving extremities with full ROM Abdomen:  Soft, nontender, nondistended. Neuro:  GCS 15, moving all four extremities, interacting appropriately. Speech clear. Psych:  Calm, appropriate.   Skin:  Warm, dry, no rash.    ED Results / Procedures / Treatments   Labs (all labs ordered are listed, but only abnormal results are displayed) Labs Reviewed - No data to display   EKG    RADIOLOGY See ED course for my independent interpretation   PROCEDURES:  Critical Care performed: No  Procedures   MEDICATIONS ORDERED IN ED: Medications - No data to display   IMPRESSION / MDM / ASSESSMENT AND PLAN / ED COURSE  I reviewed the triage vital signs and the nursing notes.                              Differential diagnosis includes, but is not limited to, constipation, rectal stool ball.  Patient's presentation is most consistent with exacerbation of chronic illness.  Patient is an 82 year old female with history of IBS presenting today  for constipation.  Exam largely unremarkable and vital signs stable.  KUB does show evidence of stool throughout the colon.  Did discuss enema today, however patient will prefer not to have this and would like to try oral medications.  She otherwise stable I do feel she is safe for discharge with bowel regimen going home.  Told to follow-up with her GI doctor for ongoing evaluation as needed.  Given strict return precautions for any worsening abdominal pain, fevers, or vomiting.  Clinical Course as of 08/31/23 1124  Thu Aug 31, 2023  1123 DG Abdomen 1 View Independently interpreted KUB showing sizable amount of stool throughout the colon especially on the descending side.  Suspect this is source of patient's symptoms. [DW]    Clinical Course User Index [DW] Janith Lima, MD     FINAL CLINICAL IMPRESSION(S) / ED DIAGNOSES   Final diagnoses:  Constipation, unspecified constipation  type     Rx / DC Orders   ED Discharge Orders     None        Note:  This document was prepared using Dragon voice recognition software and may include unintentional dictation errors.   Janith Lima, MD 08/31/23 [phone]

## 2023-08-31 NOTE — ED Triage Notes (Signed)
Patient to ED via POV for constipation. States she has been passing mucus but no fecal matter in the last 2 days. Denies abd pain at this time.

## 2023-08-31 NOTE — Progress Notes (Signed)
08/31/23: Late Entry post ED discharge:  [facility] ED [facility] Liaison for [facility] evaluated/screened patient chart while in [facility] ED setting for potential or prior engagements pending disposition.  Gabriel Cirri RN MSN  [place]  [facility], Population Health Title: ED RN Liaison Email: [email or web address] Direct Dial: Teams chat or secure chat                        Website: [email or web address]     Note: [facility] ED liaison does not counter or interfere with Inpatient Transitions of Care discharge planning or disposition.

## 2023-08-31 NOTE — ED Notes (Signed)
See triage note  Presents with some abd pain  States she has had some issues with her stomach and some bowel concerns  Has had some diarrhea and some constipation  Last BM was Monday  Now states she is passing clear mucous liquid

## 2023-08-31 NOTE — Discharge Instructions (Addendum)
You are seen in the emergency department today for abdominal pain and constipation.  X-ray does show moderate amount of stool throughout your colon which I believe is the source of your symptoms today.  I am going to send you with some medication to help with constipation symptoms at home.  Please follow-up with your GI doctor and primary care provider as needed.  Please return if your abdominal pain significantly worsen.  Please pick up MiraLAX over-the-counter and I would like you to take 2 capfuls in the morning for the next 7 days and then decrease as you have daily bowel movements.

## 2023-09-04 ENCOUNTER — Encounter: Payer: Medicare HMO | Admitting: Internal Medicine

## 2023-09-04 NOTE — Assessment & Plan Note (Deleted)
LDL is  Lab Results  Component Value Date   LDLCALC 52 01/04/2023   Currently being treated with atorvastatin with good compliance and no concerns.

## 2023-09-04 NOTE — Assessment & Plan Note (Deleted)
BP controlled on multiple medications - Coreg, amlodipine, and micardis hct

## 2023-09-04 NOTE — Progress Notes (Deleted)
Date:  09/04/2023   Name:  Samantha Lang   DOB:  1941/01/31   MRN:  [id number]   Chief Complaint: No chief complaint on file. Samantha Lang is a 82 y.o. female who presents today for her Complete Annual Exam. She feels {DESC; WELL/FAIRLY WELL/POORLY:18703}. She reports exercising ***. She reports she is sleeping {DESC; WELL/FAIRLY WELL/POORLY:18703}. Breast complaints ***.  Mammogram: 08/2023 DEXA: 02/2020 normal Colonoscopy: 2018 aged out  Health Maintenance Due  Topic Date Due   Zoster Vaccines- Shingrix (1 of 2) Never done   MAMMOGRAM  06/01/2023   OPHTHALMOLOGY EXAM  07/05/2023   INFLUENZA VACCINE  07/27/2023   Diabetic kidney evaluation - Urine ACR  09/01/2023   FOOT EXAM  09/01/2023    Immunization History  Administered Date(s) Administered   Fluad Quad(high Dose 65+) 10/09/2019   Influenza, High Dose Seasonal PF 11/12/2018   Influenza,inj,Quad PF,6+ Mos 10/02/2015, 11/07/2016, 11/01/2017   Pneumococcal Conjugate-13 02/05/2014   Pneumococcal Polysaccharide-23 06/09/2008   Tdap 06/09/2010, 10/28/2020    Hypertension This is a chronic problem. The problem is controlled. Pertinent negatives include no chest pain, headaches, palpitations or shortness of breath. Past treatments include angiotensin blockers, calcium channel blockers, beta blockers and diuretics. The current treatment provides significant improvement. There is no history of kidney disease, CAD/MI or CVA.  Hyperlipidemia This is a chronic problem. The problem is controlled. Pertinent negatives include no chest pain or shortness of breath. Current antihyperlipidemic treatment includes statins. The current treatment provides significant improvement of lipids.  Diabetes She presents for her follow-up diabetic visit. She has type 2 diabetes mellitus. Pertinent negatives for hypoglycemia include no dizziness, headaches, nervousness/anxiousness or tremors. Pertinent negatives for diabetes include no chest pain, no  fatigue, no polydipsia and no polyuria. Pertinent negatives for diabetic complications include no CVA.  Depression        This is a chronic problem.The problem is unchanged.  Associated symptoms include no fatigue and no headaches.   Lab Results  Component Value Date   NA 137 01/04/2023   K 3.5 01/04/2023   CO2 25 01/04/2023   GLUCOSE 164 (H) 01/04/2023   BUN 25 01/04/2023   CREATININE 0.78 01/04/2023   CALCIUM 9.4 01/04/2023   EGFR 77 01/04/2023   GFRNONAA 54 (L) 11/23/2020   Lab Results  Component Value Date   CHOL 162 01/04/2023   HDL 60 01/04/2023   LDLCALC 52 01/04/2023   LDLDIRECT 138.3 09/26/2011   TRIG 330 (H) 01/04/2023   CHOLHDL 2.7 01/04/2023   Lab Results  Component Value Date   TSH 3.330 08/31/2022   Lab Results  Component Value Date   HGBA1C 7.0 (A) 05/05/2023   Lab Results  Component Value Date   WBC 6.5 08/31/2022   HGB 12.2 08/31/2022   HCT 35.7 08/31/2022   MCV 92 08/31/2022   PLT CANCELED 08/31/2022   Lab Results  Component Value Date   ALT 21 01/04/2023   AST 22 01/04/2023   ALKPHOS 90 01/04/2023   BILITOT 0.7 01/04/2023   No results found for: "25OHVITD2", "25OHVITD3", "VD25OH"   Review of Systems  Constitutional:  Negative for chills, fatigue and fever.  HENT:  Negative for congestion, hearing loss, tinnitus, trouble swallowing and voice change.   Eyes:  Negative for visual disturbance.  Respiratory:  Negative for cough, chest tightness, shortness of breath and wheezing.   Cardiovascular:  Negative for chest pain, palpitations and leg swelling.  Gastrointestinal:  Negative for abdominal pain, constipation, diarrhea and  vomiting.  Endocrine: Negative for polydipsia and polyuria.  Genitourinary:  Negative for dysuria, frequency, genital sores, vaginal bleeding and vaginal discharge.  Musculoskeletal:  Negative for arthralgias, gait problem and joint swelling.  Skin:  Negative for color change and rash.  Neurological:  Negative for  dizziness, tremors, light-headedness and headaches.  Hematological:  Negative for adenopathy. Does not bruise/bleed easily.  Psychiatric/Behavioral:  Positive for depression. Negative for dysphoric mood and sleep disturbance. The patient is not nervous/anxious.     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   LVH (left ventricular hypertrophy) due to hypertensive disease, without heart failure 04/11/2019   Ganglion cyst of left foot 08/29/2018   Xerosis of skin 08/29/2018   History of endometrial cancer 08/03/2016   Type II diabetes mellitus with complication (HCC) 03/14/2016   Acid reflux 10/02/2015   Hyperlipidemia associated with type 2 diabetes mellitus (HCC) 04/08/2015   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   Bilateral carotid artery stenosis 03/26/2015   Anxiety    Environmental and seasonal allergies    Postmenopausal atrophic vaginitis    Depression, major, recurrent, moderate (HCC)    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 Shirlee Limerick, eyelid lift   POLYPECTOMY  09/22/2017   Procedure: POLYPECTOMY;  Surgeon: Midge Minium, MD;  Location: [facility];  Service: Gastroenterology;;   TUBAL LIGATION       Social History   Tobacco Use   Smoking status: Former    Current packs/day: 0.00    Average packs/day: 0.3 packs/day for 20.0 years (5.0 ttl pk-yrs)    Types: Cigarettes    Start date: 09/25/1968    Quit date: 09/25/1988    Years since quitting: 34.9   Smokeless tobacco: Never  Vaping Use   Vaping status: Never Used  Substance Use Topics   Alcohol use: Yes    Alcohol/week: 6.0 standard drinks of alcohol    Types: 2 Glasses of wine, 2 Cans of beer, 2 Shots of liquor per week    Comment: 2 times a day. 14 a week   Drug use: Never     Medication list has been reviewed and updated.  No outpatient medications have been marked as taking for the 09/04/23 encounter (Appointment) with Reubin Milan, MD.       05/05/2023   10:56 AM 04/03/2023    9:07 AM 01/04/2023   10:32 AM 08/31/2022   10:59 AM  GAD 7 : Generalized Anxiety Score  Nervous, Anxious, on Edge 2 2 0 1  Control/stop worrying 2 2 0 1  Worry too much - different things 2 2 0 1  Trouble relaxing 0 2 0 1  Restless 0 2 0 1  Easily annoyed or irritable 1 1 1 1   Afraid - awful might happen 1 1 0 2  Total GAD 7 Score 8 12 1 8   Anxiety Difficulty Somewhat difficult Somewhat difficult Not difficult at all Very difficult       05/05/2023   10:56 AM 04/03/2023    9:07 AM 02/16/2023   11:01 AM  Depression screen PHQ 2/9  Decreased Interest 0 2 0  Down, Depressed, Hopeless 0 1 1  PHQ - 2 Score 0 3 1  Altered sleeping 1 1 1   Tired, decreased energy 0 0 1  Change in appetite 2 1 0  Feeling bad or failure about yourself  0 1 0  Trouble concentrating 0 1 0  Moving slowly or fidgety/restless 0 0 0  Suicidal thoughts 0 0 0  PHQ-9 Score 3 7 3   Difficult doing work/chores Very difficult Very difficult Not difficult at all    BP Readings from Last 3 Encounters:  08/31/23 (!) 167/61  07/25/23 (!) 162/75  05/05/23 128/76    Physical Exam Vitals and nursing note reviewed.  Constitutional:      General: She is not in  acute distress.    Appearance: She is well-developed.  HENT:     Head: Normocephalic and atraumatic.     Right Ear: Tympanic membrane and ear canal normal.     Left Ear: Tympanic membrane and ear canal normal.     Nose:     Right Sinus: No maxillary sinus tenderness.     Left Sinus: No maxillary sinus tenderness.  Eyes:     General: No scleral icterus.       Right eye: No discharge.        Left eye: No discharge.     Conjunctiva/sclera: Conjunctivae normal.  Neck:     Thyroid: No thyromegaly.     Vascular: No carotid bruit.  Cardiovascular:     Rate and Rhythm: Normal rate and regular rhythm.     Pulses: Normal pulses.     Heart sounds: Normal heart sounds.  Pulmonary:     Effort: Pulmonary effort is normal. No respiratory distress.     Breath sounds: No wheezing.  Chest:  Breasts:    Right: No mass, nipple discharge, skin change or tenderness.     Left: No mass, nipple discharge, skin change or tenderness.  Abdominal:     General: Bowel sounds are normal.     Palpations: Abdomen is soft.     Tenderness: There is no abdominal tenderness.  Musculoskeletal:     Cervical back: Normal range of motion. No erythema.     Right lower leg: No edema.     Left lower leg: No edema.  Lymphadenopathy:     Cervical: No cervical adenopathy.  Skin:    General: Skin is warm and dry.     Findings: No rash.  Neurological:     Mental Status: She is alert and oriented to person, place, and time.     Cranial Nerves: No cranial nerve deficit.     Sensory: No sensory deficit.     Deep Tendon Reflexes: Reflexes are normal and symmetric.  Psychiatric:        Attention and Perception: Attention normal.        Mood and Affect: Mood normal.     Wt Readings from Last 3 Encounters:  08/31/23 173 lb (78.5 kg)  07/25/23 166 lb 4 oz (75.4 kg)  05/05/23 168 lb (76.2 kg)  There were no vitals taken for this visit.  Assessment and Plan:  Problem List Items Addressed This Visit        Unprioritized   Type II diabetes mellitus with complication (HCC) (Chronic)    Blood sugars stable without hypoglycemic symptoms or events. Current regimen is Januvia. Changes made last visit are none. Lab Results  Component Value Date   HGBA1C 7.0 (A) 05/05/2023         Hyperlipidemia associated with type 2 diabetes mellitus (HCC) (Chronic)    LDL is  Lab Results  Component Value Date   LDLCALC 52 01/04/2023   Currently being treated with atorvastatin with good compliance and no concerns.       Essential hypertension (Chronic)    BP controlled on multiple medications - Coreg, amlodipine, and micardis hct      Depression, major, recurrent, moderate (HCC) (Chronic)    Controlled on Effexor      Other Visit Diagnoses     Annual physical exam    -  Primary   Encounter for screening mammogram for breast cancer           No follow-ups on file.    Reubin Milan, MD [facility] [place]

## 2023-09-04 NOTE — Group Note (Deleted)

## 2023-09-04 NOTE — Assessment & Plan Note (Deleted)
Blood sugars stable without hypoglycemic symptoms or events. Current regimen is Januvia. Changes made last visit are none. Lab Results  Component Value Date   HGBA1C 7.0 (A) 05/05/2023

## 2023-09-04 NOTE — Assessment & Plan Note (Deleted)
Controlled on Effexor

## 2023-09-14 ENCOUNTER — Ambulatory Visit: Payer: Medicare HMO | Admitting: Physician Assistant

## 2023-09-14 NOTE — Progress Notes (Deleted)
Celso Amy, PA-C [address]  [address]  [place], [place] [address]  Main: [phone]  Fax: [phone]   Primary Care Physician: Reubin Milan, MD  Primary Gastroenterologist:  ***  CC: F/U bloating, constipation and diarrhea; history of IBS  HPI: Samantha Lang is a 82 y.o. female with returns for follow-up of bloating, diarrhea, and constipation.  History of irritable bowel syndrome.  She saw Dr. Allegra Lai for evaluation of diarrhea 06/2023.  Celiac lab was negative.  Stool test for H. pylori and fecal pancreatic elastase were ordered, however patient did not complete.  Patient went to the ED to evaluate constipation 08/31/2023.  Abdominal x-ray showed moderate stool burden with no evidence of obstruction.  Screening colonoscopy 08/2017: Sigmoid diverticulosis, otherwise normal.  Current Outpatient Medications  Medication Sig Dispense Refill   allopurinol (ZYLOPRIM) 100 MG tablet TAKE (1) TABLET BY MOUTH EVERY DAY 90 tablet 0   amLODipine (NORVASC) 5 MG tablet Take 1 tablet (5 mg total) by mouth 2 (two) times daily. 180 tablet 1   aspirin 81 MG tablet Take 81 mg by mouth daily.     atorvastatin (LIPITOR) 20 MG tablet Take 1 tablet (20 mg total) by mouth daily. 90 tablet 1   carvedilol (COREG) 12.5 MG tablet Take 1 tablet (12.5 mg total) by mouth 2 (two) times daily. 180 tablet 1   docusate sodium (COLACE) 100 MG capsule Take 1 capsule (100 mg total) by mouth daily. 30 capsule 0   Multiple Vitamin (MULTIVITAMIN) capsule Take 1 capsule by mouth daily.     pantoprazole (PROTONIX) 40 MG tablet TAKE ONE (1) TABLET BY MOUTH ONCE DAILY 90 tablet 1   sitaGLIPtin (JANUVIA) 100 MG tablet TAKE (1) TABLET BY MOUTH EVERY DAY 90 tablet 1   telmisartan-hydrochlorothiazide (MICARDIS HCT) 80-25 MG tablet TAKE (1) TABLET BY MOUTH EVERY DAY 90 tablet 1   venlafaxine XR (EFFEXOR-XR) 150 MG 24 hr capsule TAKE 1 CAPSULE BY MOUTH DAILY WITH BREAKFAST. 90 capsule 1   VITAMIN D PO Take 5,000  Units by mouth daily.     No current facility-administered medications for this visit.    Allergies as of 09/14/2023 - Review Complete 08/31/2023  Allergen Reaction Noted   Cephalexin Rash 09/26/2011   Clarithromycin Rash 09/26/2011    Past Medical History:  Diagnosis Date   Acquired trigger finger 01/13/2021   Allergy    Anxiety    Aortic valve stenosis, mild    Depression    mild   Diabetes mellitus age 64   GERD (gastroesophageal reflux disease)    Heart murmur    Hemorrhoids    Hyperlipidemia    Hypertension 2003   Incontinence    Female stress   Mitral incompetence    Personal history of chemotherapy    3 treatments   Personal history of radiation therapy    5 treatments   Postmenopausal atrophic vaginitis    Radial styloid tenosynovitis 01/13/2021   Torn rotator cuff    Trochanteric bursitis of left hip 12/07/2016   Uterine cancer [facility])    treated    Past Surgical History:  Procedure Laterality Date   ABDOMINAL HYSTERECTOMY  07/2016   BREAST BIOPSY Right 03/21/08   right, benign    COLONOSCOPY WITH PROPOFOL N/A 09/22/2017   Procedure: COLONOSCOPY WITH PROPOFOL;  Surgeon: Midge Minium, MD;  Location: [facility];  Service: Gastroenterology;  Laterality: N/A;  diabetic   EYE SURGERY  Oct 2009   for ptosis,  Dr. Shirlee Limerick, eyelid lift   POLYPECTOMY  09/22/2017   Procedure: POLYPECTOMY;  Surgeon: Midge Minium, MD;  Location: [facility];  Service: Gastroenterology;;   TUBAL LIGATION      Review of Systems:    All systems reviewed and negative except where noted in HPI.   Physical Examination:   There were no vitals taken for this visit.  General: Well-nourished, well-developed in no acute distress.  Lungs: Clear to auscultation bilaterally. Non-labored. Heart: Regular rate and rhythm, no murmurs rubs or gallops.  Abdomen: Bowel sounds are normal; Abdomen is Soft; No hepatosplenomegaly, masses or hernias;  No Abdominal Tenderness; No guarding  or rebound tenderness. Neuro: Alert and oriented x 3.  Grossly intact.  Psych: Alert and cooperative, normal mood and affect.   Imaging Studies: DG Abdomen 1 View  Result Date: 08/31/2023 CLINICAL DATA:  Constipation EXAM: ABDOMEN - 1 VIEW COMPARISON:  None Available. FINDINGS: There is a nonobstructive bowel gas pattern. There is a moderate stool burden throughout the colon. There is no definite free intraperitoneal air, within the confines of supine technique. There is no abnormal soft tissue calcification. There is no acute osseous abnormality. IMPRESSION: Moderate stool burden without evidence of obstruction. Electronically Signed   By: Lesia Hausen M.D.   On: 08/31/2023 12:27    Assessment and Plan:   SINCERE CASTLEBERRY is a 82 y.o. y/o female returns for follow-up of:  1.  Irritable bowel syndrome with constipation and diarrhea    Celso Amy, PA-C  Follow up ***  BP check ***

## 2023-09-18 ENCOUNTER — Encounter: Payer: Self-pay | Admitting: Gastroenterology

## 2023-09-18 ENCOUNTER — Ambulatory Visit
Admission: RE | Admit: 2023-09-18 | Discharge: 2023-09-18 | Disposition: A | Discharge status: Home / Self Care | Payer: Medicare HMO | Source: Ambulatory Visit | Attending: Internal Medicine | Admitting: Internal Medicine

## 2023-09-18 ENCOUNTER — Ambulatory Visit: Payer: Medicare HMO | Admitting: Gastroenterology

## 2023-09-18 VITALS — BP 164/66 | HR 68 | Temp 97.7°F | Ht 62.0 in | Wt 168.2 lb

## 2023-09-18 DIAGNOSIS — Z1231 Encounter for screening mammogram for malignant neoplasm of breast: Secondary | ICD-10-CM | POA: Diagnosis not present

## 2023-09-18 DIAGNOSIS — K5909 Other constipation: Secondary | ICD-10-CM | POA: Diagnosis not present

## 2023-09-18 NOTE — Patient Instructions (Addendum)
Gave Trulance samples take 1 tablet daily every morning. Please let us know how it works for you and we can call you in a prescription for you.  Gave samples of Ibuguard and if it works you can buy it over the counter

## 2023-09-18 NOTE — Progress Notes (Signed)
Arlyss Repress, MD [address]  [address]  [place], [place] [address]  Main: [phone]  Fax: [phone]    Gastroenterology Consultation  Referring Provider:     Reubin Milan, MD Primary Care Physician:  Reubin Milan, MD Primary Gastroenterologist:  Dr. Arlyss Repress Reason for Consultation: Overflow diarrhea secondary to constipation        HPI:   Samantha Lang is a 82 y.o. female referred by Dr. Judithann Graves, Nyoka Cowden, MD  for consultation & management of chronic diarrhea.  Patient reports that she was diagnosed with IBS and has been having intermittent episodes of diarrhea.  Over the last 1 year, her diarrheal symptoms have worsened, has watery bowel movements about 3-4 times daily.  Within last 1 week, stools are mushy and only 1 a day.  She does report feeling gassy and significantly bloated.  She denies nocturnal diarrhea.  She was seen by Dr. Maximino Greenland for her symptoms in the past, was advised to take fiber.  It did not help.  Diarrhea is impairing her quality of life, she is afraid of going out due to fear of not making it to the bathroom.  She denies any particular relation to foods.  She does consume bacon and sausage and her breakfast on a daily basis.  She likes to eat sweets.  Does have diabetes  Follow-up visit 09/18/2023 Since last visit, patient reports feeling significantly better, improvement in her bowel movements as well as bloating.  She underwent abdominal x-ray which revealed moderate stool burden.  Since then, she has been trying to incorporate more fiber, drinking more water, finally she had 1 formed stool today.  She reports that IBgard samples significantly helped with bloating.  She could not afford the stool specimen for H. pylori and pancreatic fecal elastase levels.  Celiac disease panel negative  NSAIDs: None  Antiplts/Anticoagulants/Anti thrombotics: None  GI Procedures:  Colonoscopy 09/14/2017 for screening Sigmoid diverticulosis,  otherwise normal colon  Past Medical History:  Diagnosis Date   Acquired trigger finger 01/13/2021   Allergy    Anxiety    Aortic valve stenosis, mild    Depression    mild   Diabetes mellitus age 89   GERD (gastroesophageal reflux disease)    Heart murmur    Hemorrhoids    Hyperlipidemia    Hypertension 2003   Incontinence    Female stress   Mitral incompetence    Personal history of chemotherapy    3 treatments   Personal history of radiation therapy    5 treatments   Postmenopausal atrophic vaginitis    Radial styloid tenosynovitis 01/13/2021   Torn rotator cuff    Trochanteric bursitis of left hip 12/07/2016   Uterine cancer [facility])    treated    Past Surgical History:  Procedure Laterality Date   ABDOMINAL HYSTERECTOMY  07/2016   BREAST BIOPSY Right 03/21/08   right, benign    COLONOSCOPY WITH PROPOFOL N/A 09/22/2017   Procedure: COLONOSCOPY WITH PROPOFOL;  Surgeon: Midge Minium, MD;  Location: [facility];  Service: Gastroenterology;  Laterality: N/A;  diabetic   EYE SURGERY  Oct 2009   for ptosis,  Dr. Shirlee Limerick, eyelid lift   POLYPECTOMY  09/22/2017   Procedure: POLYPECTOMY;  Surgeon: Midge Minium, MD;  Location: [facility];  Service: Gastroenterology;;   TUBAL LIGATION       Current Outpatient Medications:    allopurinol (ZYLOPRIM) 100 MG tablet, TAKE (1) TABLET BY MOUTH EVERY  DAY, Disp: 90 tablet, Rfl: 0   amLODipine (NORVASC) 5 MG tablet, Take 1 tablet (5 mg total) by mouth 2 (two) times daily., Disp: 180 tablet, Rfl: 1   aspirin 81 MG tablet, Take 81 mg by mouth daily., Disp: , Rfl:    atorvastatin (LIPITOR) 20 MG tablet, Take 1 tablet (20 mg total) by mouth daily., Disp: 90 tablet, Rfl: 1   carvedilol (COREG) 12.5 MG tablet, Take 1 tablet (12.5 mg total) by mouth 2 (two) times daily., Disp: 180 tablet, Rfl: 1   Multiple Vitamin (MULTIVITAMIN) capsule, Take 1 capsule by mouth daily., Disp: , Rfl:    pantoprazole (PROTONIX) 40 MG tablet, TAKE  ONE (1) TABLET BY MOUTH ONCE DAILY, Disp: 90 tablet, Rfl: 1   sitaGLIPtin (JANUVIA) 100 MG tablet, TAKE (1) TABLET BY MOUTH EVERY DAY, Disp: 90 tablet, Rfl: 1   telmisartan-hydrochlorothiazide (MICARDIS HCT) 80-25 MG tablet, TAKE (1) TABLET BY MOUTH EVERY DAY, Disp: 90 tablet, Rfl: 1   venlafaxine XR (EFFEXOR-XR) 150 MG 24 hr capsule, TAKE 1 CAPSULE BY MOUTH DAILY WITH BREAKFAST., Disp: 90 capsule, Rfl: 1   VITAMIN D PO, Take 5,000 Units by mouth daily., Disp: , Rfl:    Family History  Problem Relation Age of Onset   Breast cancer Paternal Aunt    Breast cancer Paternal Grandmother 29   Cancer Father 75       lung   Stroke Mother    Hypertension Mother    Cancer Brother        prostate   Depression Brother    Ovarian cancer Neg Hx    Colon cancer Neg Hx    Diabetes Neg Hx      Social History   Tobacco Use   Smoking status: Former    Current packs/day: 0.00    Average packs/day: 0.3 packs/day for 20.0 years (5.0 ttl pk-yrs)    Types: Cigarettes    Start date: 09/25/1968    Quit date: 09/25/1988    Years since quitting: 35.0   Smokeless tobacco: Never  Vaping Use   Vaping status: Never Used  Substance Use Topics   Alcohol use: Yes    Alcohol/week: 6.0 standard drinks of alcohol    Types: 2 Glasses of wine, 2 Cans of beer, 2 Shots of liquor per week    Comment: 2 times a day. 14 a week   Drug use: Never    Allergies as of 09/18/2023 - Review Complete 09/18/2023  Allergen Reaction Noted   Cephalexin Rash 09/26/2011   Clarithromycin Rash 09/26/2011    Review of Systems:    All systems reviewed and negative except where noted in HPI.   Physical Exam:  BP (!) 164/66 (BP Location: Left Arm, Patient Position: Sitting, Cuff Size: Normal)   Pulse 68   Temp 97.7 F (36.5 C) (Oral)   Ht 5\' 2"  (1.575 m)   Wt 168 lb 4 oz (76.3 kg)   BMI 30.77 kg/m  No LMP recorded. Patient has had a hysterectomy.  General:   Alert,  Well-developed, well-nourished, pleasant and  cooperative in NAD Head:  Normocephalic and atraumatic. Eyes:  Sclera clear, no icterus.   Conjunctiva pink. Ears:  Normal auditory acuity. Nose:  No deformity, discharge, or lesions. Mouth:  No deformity or lesions,oropharynx pink & moist. Neck:  Supple; no masses or thyromegaly. Lungs:  Respirations even and unlabored.  Clear throughout to auscultation.   No wheezes, crackles, or rhonchi. No acute distress. Heart:  Regular rate and rhythm;  no murmurs, clicks, rubs, or gallops. Abdomen:  Normal bowel sounds. Soft, non-tender and non distended, without masses, hepatosplenomegaly or hernias noted.  No guarding or rebound tenderness.   Rectal: Not performed Msk:  Symmetrical without gross deformities. Good, equal movement & strength bilaterally. Pulses:  Normal pulses noted. Extremities:  No clubbing or edema.  No cyanosis. Neurologic:  Alert and oriented x3;  grossly normal neurologically. Skin:  Intact without significant lesions or rashes. No jaundice. Psych:  Alert and cooperative. Normal mood and affect.  Imaging Studies: No abdominal imaging  Assessment and Plan:   CHRISTINIA POLICASTRO is a 82 y.o. female with history of diabetes, hypertension is seen in consultation for constipation with overflow diarrhea and abdominal bloating Symptoms have significantly improved with improving constipation celiac disease panel negative Given her Trulance samples, she will let us know if it is helping Continue IBgard as needed Continue high-fiber diet, adequate intake of water  Follow up as needed  Arlyss Repress, MD

## 2023-10-23 DIAGNOSIS — Z008 Encounter for other general examination: Secondary | ICD-10-CM | POA: Diagnosis not present

## 2023-11-14 ENCOUNTER — Encounter: Payer: Self-pay | Admitting: Internal Medicine

## 2023-11-14 ENCOUNTER — Ambulatory Visit (INDEPENDENT_AMBULATORY_CARE_PROVIDER_SITE_OTHER): Payer: Medicare HMO | Admitting: Internal Medicine

## 2023-11-14 VITALS — BP 126/78 | HR 66 | Ht 62.0 in | Wt 167.0 lb

## 2023-11-14 DIAGNOSIS — K640 First degree hemorrhoids: Secondary | ICD-10-CM | POA: Diagnosis not present

## 2023-11-14 DIAGNOSIS — Z7984 Long term (current) use of oral hypoglycemic drugs: Secondary | ICD-10-CM

## 2023-11-14 DIAGNOSIS — R21 Rash and other nonspecific skin eruption: Secondary | ICD-10-CM | POA: Diagnosis not present

## 2023-11-14 DIAGNOSIS — E118 Type 2 diabetes mellitus with unspecified complications: Secondary | ICD-10-CM

## 2023-11-14 DIAGNOSIS — K582 Mixed irritable bowel syndrome: Secondary | ICD-10-CM

## 2023-11-14 MED ORDER — HYDROCORTISONE (PERIANAL) 2.5 % EX CREA
1.0000 | TOPICAL_CREAM | Freq: Two times a day (BID) | CUTANEOUS | 2 refills | Status: AC
Start: 2023-11-14 — End: ?

## 2023-11-14 MED ORDER — TRIAMCINOLONE ACETONIDE 0.1 % EX CREA
1.0000 | TOPICAL_CREAM | Freq: Two times a day (BID) | CUTANEOUS | 0 refills | Status: AC
Start: 2023-11-14 — End: ?

## 2023-11-14 NOTE — Progress Notes (Addendum)
Date:  11/14/2023   Name:  Samantha Lang   DOB:  10/25/1941   MRN:  [id number]   Chief Complaint: Rectal Pain (Burning in rectum. Patient has diarrhea on and off. Said she has seen GI in the past for this. Said she has rectal burning, pain, and some blood only when she wipes.)  Rectal Bleeding  The current episode started more than 2 weeks ago. The problem occurs frequently. The pain is mild. The stool is described as soft and streaked with blood. Associated symptoms include diarrhea and rectal pain. Pertinent negatives include no fever, no chest pain and no headaches.  Diabetes She presents for her follow-up diabetic visit. She has type 2 diabetes mellitus. Her disease course has been stable. Pertinent negatives for hypoglycemia include no dizziness or headaches. Pertinent negatives for diabetes include no chest pain. Symptoms are stable.    Review of Systems  Constitutional:  Negative for fever and unexpected weight change.  Respiratory:  Negative for chest tightness and shortness of breath.   Cardiovascular:  Negative for chest pain.  Gastrointestinal:  Positive for blood in stool, diarrhea, hematochezia and rectal pain.  Neurological:  Negative for dizziness and headaches.     Lab Results  Component Value Date   NA 137 01/04/2023   K 3.5 01/04/2023   CO2 25 01/04/2023   GLUCOSE 164 (H) 01/04/2023   BUN 25 01/04/2023   CREATININE 0.78 01/04/2023   CALCIUM 9.4 01/04/2023   EGFR 77 01/04/2023   GFRNONAA 54 (L) 11/23/2020   Lab Results  Component Value Date   CHOL 162 01/04/2023   HDL 60 01/04/2023   LDLCALC 52 01/04/2023   LDLDIRECT 138.3 09/26/2011   TRIG 330 (H) 01/04/2023   CHOLHDL 2.7 01/04/2023   Lab Results  Component Value Date   TSH 3.330 08/31/2022   Lab Results  Component Value Date   HGBA1C 7.0 (A) 05/05/2023   Lab Results  Component Value Date   WBC 6.5 08/31/2022   HGB 12.2 08/31/2022   HCT 35.7 08/31/2022   MCV 92 08/31/2022   PLT CANCELED  08/31/2022   Lab Results  Component Value Date   ALT 21 01/04/2023   AST 22 01/04/2023   ALKPHOS 90 01/04/2023   BILITOT 0.7 01/04/2023   No results found for: "25OHVITD2", "25OHVITD3", "VD25OH"   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   LVH (left ventricular hypertrophy) due to hypertensive disease, without heart failure 04/11/2019   Ganglion cyst of left foot 08/29/2018   Xerosis of skin 08/29/2018   History of endometrial cancer 08/03/2016   Type II diabetes mellitus with complication (HCC) 03/14/2016   Acid reflux 10/02/2015   Hyperlipidemia associated with type 2 diabetes mellitus (HCC) 04/08/2015   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   Bilateral carotid artery stenosis 03/26/2015   Anxiety    Environmental and seasonal allergies    Postmenopausal atrophic vaginitis    Depression, major, recurrent, moderate (HCC)    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 Shirlee Limerick, eyelid lift   POLYPECTOMY  09/22/2017   Procedure: POLYPECTOMY;  Surgeon: Midge Minium, MD;  Location: [facility];  Service: Gastroenterology;;   TUBAL LIGATION      Social History   Tobacco Use   Smoking status: Former     Current packs/day: 0.00    Average packs/day: 0.3 packs/day for 20.0 years (5.0 ttl pk-yrs)    Types: Cigarettes    Start date: 09/25/1968    Quit date: 09/25/1988    Years since quitting: 35.1   Smokeless tobacco: Never  Vaping Use   Vaping status: Never Used  Substance Use Topics   Alcohol use: Yes    Alcohol/week: 6.0 standard drinks of alcohol    Types: 2 Glasses of wine, 2 Cans of beer, 2 Shots of liquor per week    Comment: 2 times a day. 14 a week   Drug use: Never     Medication list has been reviewed and updated.  Current Meds  Medication Sig   allopurinol (ZYLOPRIM) 100 MG tablet TAKE (1) TABLET BY MOUTH EVERY DAY   amLODipine (NORVASC) 5 MG tablet Take 1 tablet (5 mg total) by mouth 2 (two) times daily.   aspirin 81 MG tablet Take 81 mg by mouth daily.   atorvastatin (LIPITOR) 20 MG tablet Take 1 tablet (20 mg total) by mouth daily.   carvedilol (COREG) 12.5 MG tablet Take 1 tablet (12.5 mg total) by mouth 2 (two) times daily.   hydrocortisone (ANUSOL-HC) 2.5 % rectal cream Place 1 Application rectally 2 (two) times daily.   Multiple Vitamin (MULTIVITAMIN) capsule Take 1 capsule by mouth daily.   pantoprazole (PROTONIX) 40 MG tablet TAKE ONE (1) TABLET BY MOUTH ONCE DAILY   sitaGLIPtin (JANUVIA) 100 MG tablet TAKE (1) TABLET BY MOUTH EVERY DAY   telmisartan-hydrochlorothiazide (MICARDIS HCT) 80-25 MG tablet TAKE (1) TABLET BY MOUTH EVERY DAY   triamcinolone cream (KENALOG) 0.1 % Apply 1 Application topically 2 (two) times daily.   venlafaxine XR (EFFEXOR-XR) 150 MG 24 hr capsule TAKE 1 CAPSULE BY MOUTH DAILY WITH BREAKFAST.   VITAMIN D PO Take 5,000 Units by mouth daily.       11/14/2023   10:26 AM 05/05/2023   10:56 AM 04/03/2023    9:07 AM 01/04/2023   10:32 AM  GAD 7 : Generalized Anxiety Score  Nervous, Anxious, on Edge 1 2 2  0  Control/stop worrying 1 2 2  0  Worry too much - different things 1 2 2  0  Trouble relaxing 1 0 2 0  Restless 1 0 2 0  Easily annoyed  or irritable 1 1 1 1   Afraid - awful might happen 1 1 1  0  Total GAD 7 Score 7 8 12 1   Anxiety Difficulty Somewhat difficult Somewhat difficult Somewhat difficult Not difficult at all       11/14/2023   10:25 AM 05/05/2023   10:56 AM 04/03/2023    9:07 AM  Depression screen PHQ 2/9  Decreased Interest 2 0 2  Down, Depressed, Hopeless 2 0 1  PHQ - 2 Score 4 0 3  Altered sleeping 2 1 1   Tired, decreased energy  2 0 0  Change in appetite 0 2 1  Feeling bad or failure about yourself  2 0 1  Trouble concentrating 2 0 1  Moving slowly or fidgety/restless 1 0 0  Suicidal thoughts 0 0 0  PHQ-9 Score 13 3 7   Difficult doing work/chores Very difficult Very difficult Very difficult    BP Readings from Last 3 Encounters:  11/14/23 126/78  09/18/23 (!) 164/66  08/31/23 (!) 167/61    Physical Exam Vitals and nursing note reviewed.  Constitutional:      General: She is not in acute distress.    Appearance: She is well-developed.  HENT:     Head: Normocephalic and atraumatic.  Cardiovascular:     Rate and Rhythm: Normal rate and regular rhythm.  Pulmonary:     Effort: Pulmonary effort is normal. No respiratory distress.     Breath sounds: No wheezing or rhonchi.  Abdominal:     General: Abdomen is flat.  Genitourinary:    Rectum: Guaiac result negative. External hemorrhoid present.       Comments: Soft hemorrhoid without bleeding Guaiac negative Skin:    General: Skin is warm and dry.     Findings: Rash present.     Comments: Scattered non specific excoriated lesions on both lower legs < 12 total smaller than 3 mm  Neurological:     Mental Status: She is alert and oriented to person, place, and time.  Psychiatric:        Mood and Affect: Mood normal.        Behavior: Behavior normal.     Wt Readings from Last 3 Encounters:  11/14/23 167 lb (75.8 kg)  09/18/23 168 lb 4 oz (76.3 kg)  08/31/23 173 lb (78.5 kg)    BP 126/78   Pulse 66   Ht 5\' 2"  (1.575 m)   Wt 167 lb  (75.8 kg)   SpO2 96%   BMI 30.54 kg/m   Assessment and Plan:  Problem List Items Addressed This Visit       Unprioritized   Type II diabetes mellitus with complication (HCC) (Chronic)    Blood sugars stable without hypoglycemic symptoms or events. Currently managed with Januvia. Changes made last visit are none. Lab Results  Component Value Date   HGBA1C 7.0 (A) 05/05/2023         Relevant Orders   Basic metabolic panel   Hemoglobin A1c   Microalbumin / creatinine urine ratio   Irritable bowel syndrome with both constipation and diarrhea    Had had more loose stools recently and she thinks she has a hemorrhoid. Had responded to IBDgard but stopped it.      Other Visit Diagnoses     Bleeding grade I hemorrhoids    -  Primary   Relevant Medications   hydrocortisone (ANUSOL-HC) 2.5 % rectal cream   Rash and nonspecific skin eruption       Relevant Medications   triamcinolone cream (KENALOG) 0.1 %   Long term current use of oral hypoglycemic drug           No follow-ups on file.    Reubin Milan, MD [facility] [place]

## 2023-11-14 NOTE — Assessment & Plan Note (Signed)
Had had more loose stools recently and she thinks she has a hemorrhoid. Had responded to IBDgard but stopped it.

## 2023-11-14 NOTE — Assessment & Plan Note (Signed)
Blood sugars stable without hypoglycemic symptoms or events. Currently managed with Januvia. Changes made last visit are none. Lab Results  Component Value Date   HGBA1C 7.0 (A) 05/05/2023

## 2023-11-15 ENCOUNTER — Other Ambulatory Visit: Payer: Self-pay | Admitting: Internal Medicine

## 2023-11-15 DIAGNOSIS — M10079 Idiopathic gout, unspecified ankle and foot: Secondary | ICD-10-CM

## 2023-11-15 LAB — BASIC METABOLIC PANEL
BUN/Creatinine Ratio: 28 (ref 12–28)
BUN: 23 mg/dL (ref 8–27)
CO2: 27 mmol/L (ref 20–29)
Calcium: 9.1 mg/dL (ref 8.7–10.3)
Chloride: 98 mmol/L (ref 96–106)
Creatinine, Ser: 0.82 mg/dL (ref 0.57–1.00)
Glucose: 147 mg/dL — ABNORMAL HIGH (ref 70–99)
Potassium: 3.9 mmol/L (ref 3.5–5.2)
Sodium: 140 mmol/L (ref 134–144)
eGFR: 72 mL/min/{1.73_m2} (ref >59)

## 2023-11-15 LAB — MICROALBUMIN / CREATININE URINE RATIO
Creatinine, Urine: 49.3 mg/dL
Microalb/Creat Ratio: 510 mg/g{creat} — ABNORMAL HIGH (ref 0–29)
Microalbumin, Urine: 251.5 ug/mL

## 2023-11-15 LAB — HEMOGLOBIN A1C
Est. average glucose Bld gHb Est-mCnc: 174 mg/dL
Hgb A1c MFr Bld: 7.7 % — ABNORMAL HIGH (ref 4.8–5.6)

## 2023-11-16 NOTE — Telephone Encounter (Signed)
Requested medications are due for refill today.  yes  Requested medications are on the active medications list.  yes  Last refill. 07/10/2023 #90 0 rf  Future visit scheduled.   yes  Notes to clinic.  Expired labs    Requested Prescriptions  Pending Prescriptions Disp Refills   allopurinol (ZYLOPRIM) 100 MG tablet [Pharmacy Med Name: ALLOPURINOL 100MG  TABLET] 90 tablet 0    Sig: TAKE (1) TABLET BY MOUTH EVERY DAY     Endocrinology:  Gout Agents - allopurinol Failed - 11/15/2023 11:34 AM      Failed - Uric Acid in normal range and within 360 days    Uric Acid  Date Value Ref Range Status  10/12/2018 7.0 2.5 - 7.1 mg/dL Final    Comment:               Therapeutic target for gout patients: <6.0         Failed - CBC within normal limits and completed in the last 12 months    WBC  Date Value Ref Range Status  08/31/2022 6.5 3.4 - 10.8 x10E3/uL Final   RBC  Date Value Ref Range Status  08/31/2022 3.89 3.77 - 5.28 x10E6/uL Final   Hemoglobin  Date Value Ref Range Status  08/31/2022 12.2 11.1 - 15.9 g/dL Final   Hematocrit  Date Value Ref Range Status  08/31/2022 35.7 34.0 - 46.6 % Final   MCHC  Date Value Ref Range Status  08/31/2022 34.2 31.5 - 35.7 g/dL Final   [facility]  Date Value Ref Range Status  08/31/2022 31.4 26.6 - 33.0 pg Final   MCV  Date Value Ref Range Status  08/31/2022 92 79 - 97 fL Final   No results found for: "PLTCOUNTKUC", "LABPLAT", "POCPLA" RDW  Date Value Ref Range Status  08/31/2022 12.6 11.7 - 15.4 % Final         Passed - Cr in normal range and within 360 days    Creatinine, Ser  Date Value Ref Range Status  11/14/2023 0.82 0.57 - 1.00 mg/dL Final         Passed - Valid encounter within last 12 months    Recent Outpatient Visits           2 days ago Bleeding grade I hemorrhoids   [place] [facility] at [facility], Nyoka Cowden, MD   6 months ago Type II diabetes mellitus with complication [facility])    [place] [facility] at [facility], Nyoka Cowden, MD   7 months ago Herpes zoster with ophthalmic complication, unspecified herpes zoster eye disease   [place] [facility] at [facility], Nyoka Cowden, MD   8 months ago Irritable bowel syndrome with both constipation and diarrhea   [place] [facility] at [facility], Nyoka Cowden, MD   10 months ago Type II diabetes mellitus with complication [facility]   [place] [facility] at [facility], Nyoka Cowden, MD       Future Appointments             In 2 months Judithann Graves Nyoka Cowden, MD [facility] at [facility], [facility]

## 2023-12-07 ENCOUNTER — Other Ambulatory Visit: Payer: Self-pay | Admitting: Internal Medicine

## 2023-12-07 DIAGNOSIS — F331 Major depressive disorder, recurrent, moderate: Secondary | ICD-10-CM

## 2023-12-07 NOTE — Telephone Encounter (Signed)
Requested Prescriptions  Pending Prescriptions Disp Refills   venlafaxine XR (EFFEXOR-XR) 150 MG 24 hr capsule [Pharmacy Med Name: VENLAFAXINE HYDROCHLORIDE ER 150MG  ER CAPSULE ER 24HR] 90 capsule 0    Sig: TAKE ONE (1) CAPSULE BY MOUTH DAILY WITH BREAKFAST.     Psychiatry: Antidepressants - SNRI - desvenlafaxine & venlafaxine Failed - 12/07/2023 12:00 PM      Failed - Valid encounter within last 6 months    Recent Outpatient Visits           3 weeks ago Bleeding grade I hemorrhoids   [place] [facility] at [facility], Nyoka Cowden, MD   7 months ago Type II diabetes mellitus with complication [facility])   [place] [facility] at [facility], Nyoka Cowden, MD   8 months ago Herpes zoster with ophthalmic complication, unspecified herpes zoster eye disease   [place] [facility] at [facility], Nyoka Cowden, MD   8 months ago Irritable bowel syndrome with both constipation and diarrhea   [place] [facility] at [facility], Nyoka Cowden, MD   11 months ago Type II diabetes mellitus with complication [facility])   [place] [facility] at [facility], Nyoka Cowden, MD       Future Appointments             In 1 month Judithann Graves Nyoka Cowden, MD [facility] at [facility], [facility]            Failed - Lipid Panel in normal range within the last 12 months    Cholesterol, Total  Date Value Ref Range Status  01/04/2023 162 100 - 199 mg/dL Final   LDL Chol Calc (NIH)  Date Value Ref Range Status  01/04/2023 52 0 - 99 mg/dL Final   Direct LDL  Date Value Ref Range Status  09/26/2011 138.3 mg/dL Final    Comment:    Optimal:  <100 mg/dLNear or Above Optimal:  100-129 mg/dLBorderline High:  130-159 mg/dLHigh:  160-189 mg/dLVery High:  >190 mg/dL   HDL  Date Value Ref Range Status  01/04/2023 60 >39 mg/dL Final    Triglycerides  Date Value Ref Range Status  01/04/2023 330 (H) 0 - 149 mg/dL Final         Passed - Cr in normal range and within 360 days    Creatinine, Ser  Date Value Ref Range Status  11/14/2023 0.82 0.57 - 1.00 mg/dL Final         Passed - Completed PHQ-2 or PHQ-9 in the last 360 days      Passed - Last BP in normal range    BP Readings from Last 1 Encounters:  11/14/23 126/78

## 2023-12-30 ENCOUNTER — Emergency Department: Payer: Medicare HMO

## 2023-12-30 ENCOUNTER — Emergency Department
Admission: EM | Admit: 2023-12-30 | Discharge: 2023-12-30 | Disposition: A | Discharge status: Home / Self Care | Payer: Medicare HMO | Attending: Emergency Medicine | Admitting: Emergency Medicine

## 2023-12-30 ENCOUNTER — Other Ambulatory Visit: Payer: Self-pay | Admitting: Internal Medicine

## 2023-12-30 ENCOUNTER — Other Ambulatory Visit: Payer: Self-pay

## 2023-12-30 DIAGNOSIS — J069 Acute upper respiratory infection, unspecified: Secondary | ICD-10-CM | POA: Insufficient documentation

## 2023-12-30 DIAGNOSIS — B9789 Other viral agents as the cause of diseases classified elsewhere: Secondary | ICD-10-CM | POA: Insufficient documentation

## 2023-12-30 DIAGNOSIS — Z20822 Contact with and (suspected) exposure to covid-19: Secondary | ICD-10-CM | POA: Insufficient documentation

## 2023-12-30 DIAGNOSIS — I10 Essential (primary) hypertension: Secondary | ICD-10-CM | POA: Diagnosis not present

## 2023-12-30 DIAGNOSIS — R059 Cough, unspecified: Secondary | ICD-10-CM | POA: Diagnosis not present

## 2023-12-30 DIAGNOSIS — E119 Type 2 diabetes mellitus without complications: Secondary | ICD-10-CM | POA: Insufficient documentation

## 2023-12-30 DIAGNOSIS — K219 Gastro-esophageal reflux disease without esophagitis: Secondary | ICD-10-CM

## 2023-12-30 DIAGNOSIS — I3481 Nonrheumatic mitral (valve) annulus calcification: Secondary | ICD-10-CM | POA: Diagnosis not present

## 2023-12-30 DIAGNOSIS — J189 Pneumonia, unspecified organism: Secondary | ICD-10-CM | POA: Diagnosis not present

## 2023-12-30 DIAGNOSIS — M10079 Idiopathic gout, unspecified ankle and foot: Secondary | ICD-10-CM

## 2023-12-30 LAB — RESP PANEL BY RT-PCR (RSV, FLU A&B, COVID)  RVPGX2
Influenza A by PCR: NEGATIVE
Influenza B by PCR: NEGATIVE
Resp Syncytial Virus by PCR: NEGATIVE
SARS Coronavirus 2 by RT PCR: NEGATIVE

## 2023-12-30 LAB — GROUP A STREP BY PCR: Group A Strep by PCR: NOT DETECTED

## 2023-12-30 NOTE — Discharge Instructions (Signed)
 You can take cold medications as needed, please keep in mind that the combination cold medicines often contain Tylenol  so if you need additional pain control while taking them please take ibuprofen.  Stay well-hydrated.  You can try sleeping with a humidifier by your bed as this helps to break the mucus up.

## 2023-12-30 NOTE — ED Provider Triage Note (Signed)
 Emergency Medicine Provider Triage Evaluation Note  Samantha Lang , a 83 y.o. female  was evaluated in triage.  Pt complains of cough, sore throat. Symptoms began on Sunday.   Review of Systems  Positive: Cough, congestion, sore throat Negative: fever  Physical Exam  There were no vitals taken for this visit. Gen:   Awake, no distress   Resp:  Normal effort  MSK:   Moves extremities without difficulty  Other:    Medical Decision Making  Medically screening exam initiated at 12:21 PM.  Appropriate orders placed.  LELANI GARNETT was informed that the remainder of the evaluation will be completed by another provider, this initial triage assessment does not replace that evaluation, and the importance of remaining in the ED until their evaluation is complete.    Cleaster Tinnie LABOR, PA-C 12/30/23 1224

## 2023-12-30 NOTE — ED Triage Notes (Signed)
 Pt states cough for 2 days, pt states sore throat. States husband made pt come due to cough. Pt unsure of sick contacts. NAD noted.

## 2023-12-30 NOTE — ED Provider Notes (Signed)
 [facility] Provider Note    Event Date/Time   First MD Initiated Contact with Patient 12/30/23 1352     (approximate)   History   Cough and Sore Throat   HPI  Samantha Lang is a 83 y.o. female with PMH of anxiety, hypertension, aortic valve stenosis, diabetes, mitral incompetence presents for evaluation of cough, sore throat and congestion for a week.  Patient states she is only here because her husband made her come in.  She has taken cold medication at home to manage her symptoms.     Physical Exam   Triage Vital Signs: ED Triage Vitals  Encounter Vitals Group     BP 12/30/23 1224 (!) 147/94     Systolic BP Percentile --      Diastolic BP Percentile --      Pulse Rate 12/30/23 1224 61     Resp 12/30/23 1224 20     Temp 12/30/23 1224 97.7 F (36.5 C)     Temp Source 12/30/23 1224 Oral     SpO2 12/30/23 1224 97 %     Weight 12/30/23 1225 170 lb (77.1 kg)     Height 12/30/23 1225 5' 2 (1.575 m)     Head Circumference --      Peak Flow --      Pain Score 12/30/23 1225 5     Pain Loc --      Pain Education --      Exclude from Growth Chart --     Most recent vital signs: Vitals:   12/30/23 1224  BP: (!) 147/94  Pulse: 61  Resp: 20  Temp: 97.7 F (36.5 C)  SpO2: 97%    General: Awake, no distress.  CV:  Good peripheral perfusion.  RRR. Resp:  Normal effort.  CTAB. Abd:  No distention.     ED Results / Procedures / Treatments   Labs (all labs ordered are listed, but only abnormal results are displayed) Labs Reviewed  RESP PANEL BY RT-PCR (RSV, FLU A&B, COVID)  [id number]  GROUP A STREP BY PCR     RADIOLOGY  Chest x-ray obtained, I interpreted the images as well as reviewed the radiologist report which is negative for any acute abnormalities.   PROCEDURES:  Critical Care performed: No  Procedures   MEDICATIONS ORDERED IN ED: Medications - No data to display   IMPRESSION / MDM / ASSESSMENT AND PLAN / ED COURSE   I reviewed the triage vital signs and the nursing notes.                             83 year old female presents for evaluation of URI symptoms.  Vital signs are stable aside from an elevated blood pressure, patient NAD on exam.  Differential diagnosis includes, but is not limited to, flu, COVID, RSV, bronchitis, pneumonia, strep throat.  Patient's presentation is most consistent with acute complicated illness / injury requiring diagnostic workup.  Respiratory panel, strep test and chest x-ray were all negative.  Patient was advised on symptomatic management.  She can take cold medication as needed.  She voiced understanding, all questions were answered and she was stable at discharge.      FINAL CLINICAL IMPRESSION(S) / ED DIAGNOSES   Final diagnoses:  Viral URI with cough     Rx / DC Orders   ED Discharge Orders     None  Note:  This document was prepared using Dragon voice recognition software and may include unintentional dictation errors.   Cleaster Tinnie LABOR, PA-C 12/30/23 1432    Jossie Artist POUR, MD 12/31/23 [phone]

## 2024-01-02 NOTE — Telephone Encounter (Signed)
 Requested Prescriptions  Pending Prescriptions Disp Refills   amLODipine  (NORVASC ) 5 MG tablet [Pharmacy Med Name: AMLODIPINE  BESYLATE 5MG  TABLET] 180 tablet 1    Sig: TAKE ONE (1) TABLET BY MOUTH TWO (2) (TWO) TIMES DAILY.     Cardiovascular: Calcium  Channel Blockers 2 Failed - 01/02/2024  2:10 PM      Failed - Last BP in normal range    BP Readings from Last 1 Encounters:  12/30/23 (!) 147/94         Failed - Valid encounter within last 6 months    Recent Outpatient Visits           1 month ago Bleeding grade I hemorrhoids   [place] [facility] at [facility], Leita DEL, MD   8 months ago Type II diabetes mellitus with complication [facility])   [place] [facility] at [facility], Leita DEL, MD   9 months ago Herpes zoster with ophthalmic complication, unspecified herpes zoster eye disease   [place] [facility] at [facility], Leita DEL, MD   9 months ago Irritable bowel syndrome with both constipation and diarrhea   [place] [facility] at [facility], Leita DEL, MD   12 months ago Type II diabetes mellitus with complication [facility])   [place] [facility] at [facility], Leita DEL, MD       Future Appointments             In 1 month Justus, Leita DEL, MD [facility] at [facility], [facility]            Passed - Last Heart Rate in normal range    Pulse Readings from Last 1 Encounters:  12/30/23 61          pantoprazole  (PROTONIX ) 40 MG tablet [Pharmacy Med Name: PANTOPRAZOLE  SODIUM 40MG  TABLET DR] 90 tablet 1    Sig: TAKE ONE (1) TABLET BY MOUTH ONCE DAILY     Gastroenterology: Proton Pump Inhibitors Passed - 01/02/2024  2:10 PM      Passed - Valid encounter within last 12 months    Recent Outpatient Visits           1 month ago Bleeding grade I hemorrhoids   [place]  [facility] at [facility], Leita DEL, MD   8 months ago Type II diabetes mellitus with complication [facility])   [place] [facility] at [facility], Leita DEL, MD   9 months ago Herpes zoster with ophthalmic complication, unspecified herpes zoster eye disease   [place] [facility] at [facility], Leita DEL, MD   9 months ago Irritable bowel syndrome with both constipation and diarrhea   [place] [facility] at [facility], Leita DEL, MD   12 months ago Type II diabetes mellitus with complication [facility])   [place] [facility] at [facility], Leita DEL, MD       Future Appointments             In 1 month Justus Leita DEL, MD [facility] at [facility], [facility]             allopurinol  (ZYLOPRIM ) 100 MG tablet [Pharmacy Med Name: ALLOPURINOL  100MG  TABLET]  30 tablet 0    Sig: TAKE (1) TABLET BY MOUTH EVERY DAY     Endocrinology:  Gout Agents - allopurinol  Failed - 01/02/2024  2:10 PM      Failed - Uric Acid in normal range and within 360 days    Uric Acid  Date Value Ref Range Status  10/12/2018 7.0 2.5 - 7.1 mg/dL Final    Comment:               Therapeutic target for gout patients: <6.0         Failed - CBC within normal limits and completed in the last 12 months    WBC  Date Value Ref Range Status  08/31/2022 6.5 3.4 - 10.8 x10E3/uL Final   RBC  Date Value Ref Range Status  08/31/2022 3.89 3.77 - 5.28 x10E6/uL Final   Hemoglobin  Date Value Ref Range Status  08/31/2022 12.2 11.1 - 15.9 g/dL Final   Hematocrit  Date Value Ref Range Status  08/31/2022 35.7 34.0 - 46.6 % Final   MCHC  Date Value Ref Range Status  08/31/2022 34.2 31.5 - 35.7 g/dL Final   [facility]  Date Value Ref Range Status  08/31/2022 31.4 26.6 - 33.0 pg Final   MCV  Date Value Ref Range Status  08/31/2022  92 79 - 97 fL Final   No results found for: PLTCOUNTKUC, LABPLAT, POCPLA RDW  Date Value Ref Range Status  08/31/2022 12.6 11.7 - 15.4 % Final         Passed - Cr in normal range and within 360 days    Creatinine, Ser  Date Value Ref Range Status  11/14/2023 0.82 0.57 - 1.00 mg/dL Final         Passed - Valid encounter within last 12 months    Recent Outpatient Visits           1 month ago Bleeding grade I hemorrhoids   [place] [facility] at [facility], Leita DEL, MD   8 months ago Type II diabetes mellitus with complication [facility])   [place] [facility] at [facility], Leita DEL, MD   9 months ago Herpes zoster with ophthalmic complication, unspecified herpes zoster eye disease   [place] [facility] at [facility], Leita DEL, MD   9 months ago Irritable bowel syndrome with both constipation and diarrhea   [place] [facility] at [facility], Leita DEL, MD   12 months ago Type II diabetes mellitus with complication [facility])   [place] [facility] at [facility], Leita DEL, MD       Future Appointments             In 1 month Justus Leita DEL, MD [facility] at [facility]

## 2024-01-02 NOTE — Telephone Encounter (Signed)
 Requested medication (s) are due for refill today: yes  Requested medication (s) are on the active medication list: yes  Last refill:  11/16/23 #30/0  Future visit scheduled: yes  Notes to clinic:  Unable to refill per protocol due to failed labs, no updated results.      Requested Prescriptions  Pending Prescriptions Disp Refills   allopurinol  (ZYLOPRIM ) 100 MG tablet [Pharmacy Med Name: ALLOPURINOL  100MG  TABLET] 30 tablet 0    Sig: TAKE (1) TABLET BY MOUTH EVERY DAY     Endocrinology:  Gout Agents - allopurinol  Failed - 01/02/2024  2:11 PM      Failed - Uric Acid in normal range and within 360 days    Uric Acid  Date Value Ref Range Status  10/12/2018 7.0 2.5 - 7.1 mg/dL Final    Comment:               Therapeutic target for gout patients: <6.0         Failed - CBC within normal limits and completed in the last 12 months    WBC  Date Value Ref Range Status  08/31/2022 6.5 3.4 - 10.8 x10E3/uL Final   RBC  Date Value Ref Range Status  08/31/2022 3.89 3.77 - 5.28 x10E6/uL Final   Hemoglobin  Date Value Ref Range Status  08/31/2022 12.2 11.1 - 15.9 g/dL Final   Hematocrit  Date Value Ref Range Status  08/31/2022 35.7 34.0 - 46.6 % Final   MCHC  Date Value Ref Range Status  08/31/2022 34.2 31.5 - 35.7 g/dL Final   [facility]  Date Value Ref Range Status  08/31/2022 31.4 26.6 - 33.0 pg Final   MCV  Date Value Ref Range Status  08/31/2022 92 79 - 97 fL Final   No results found for: PLTCOUNTKUC, LABPLAT, POCPLA RDW  Date Value Ref Range Status  08/31/2022 12.6 11.7 - 15.4 % Final         Passed - Cr in normal range and within 360 days    Creatinine, Ser  Date Value Ref Range Status  11/14/2023 0.82 0.57 - 1.00 mg/dL Final         Passed - Valid encounter within last 12 months    Recent Outpatient Visits           1 month ago Bleeding grade I hemorrhoids   [place] [facility] at [facility], Leita DEL, MD   8 months  ago Type II diabetes mellitus with complication [facility])   [place] [facility] at [facility], Leita DEL, MD   9 months ago Herpes zoster with ophthalmic complication, unspecified herpes zoster eye disease   [place] [facility] at [facility], Leita DEL, MD   9 months ago Irritable bowel syndrome with both constipation and diarrhea   [place] [facility] at [facility], Leita DEL, MD   12 months ago Type II diabetes mellitus with complication [facility])   [place] [facility] at [facility], Leita DEL, MD       Future Appointments             In 1 month Justus Leita DEL, MD [facility] at [facility]            Signed Prescriptions Disp Refills   amLODipine  (NORVASC ) 5 MG tablet 180 tablet 0  Sig: TAKE ONE (1) TABLET BY MOUTH TWO (2) (TWO) TIMES DAILY.     Cardiovascular: Calcium  Channel Blockers 2 Failed - 01/02/2024  2:11 PM      Failed - Last BP in normal range    BP Readings from Last 1 Encounters:  12/30/23 (!) 147/94         Failed - Valid encounter within last 6 months    Recent Outpatient Visits           1 month ago Bleeding grade I hemorrhoids   [place] [facility] at [facility], Leita DEL, MD   8 months ago Type II diabetes mellitus with complication [facility])   [place] [facility] at [facility], Leita DEL, MD   9 months ago Herpes zoster with ophthalmic complication, unspecified herpes zoster eye disease   [place] [facility] at [facility], Leita DEL, MD   9 months ago Irritable bowel syndrome with both constipation and diarrhea   [place] [facility] at [facility], Leita DEL, MD   12 months ago Type II diabetes mellitus with complication [facility])   [facility] at [facility], Leita DEL, MD       Future Appointments             In 1 month Justus, Leita DEL, MD [facility] Medicine at [facility]            Passed - Last Heart Rate in normal range    Pulse Readings from Last 1 Encounters:  12/30/23 61          pantoprazole  (PROTONIX ) 40 MG tablet 90 tablet 0    Sig: TAKE ONE (1) TABLET BY MOUTH ONCE DAILY     Gastroenterology: Proton Pump Inhibitors Passed - 01/02/2024  2:11 PM      Passed - Valid encounter within last 12 months    Recent Outpatient Visits           1 month ago Bleeding grade I hemorrhoids   [place] [facility] at [facility], Leita DEL, MD   8 months ago Type II diabetes mellitus with complication [facility])   [place] [facility] at [facility], Leita DEL, MD   9 months ago Herpes zoster with ophthalmic complication, unspecified herpes zoster eye disease   [place] [facility] at [facility], Leita DEL, MD   9 months ago Irritable bowel syndrome with both constipation and diarrhea   [place] [facility] at [facility], Leita DEL, MD   12 months ago Type II diabetes mellitus with complication [facility])    Primary Care & Sports Medicine at [facility], Leita DEL, MD       Future Appointments             In 1 month Justus, Leita DEL, MD [facility] at [facility], [facility]

## 2024-01-16 ENCOUNTER — Encounter: Payer: Medicare HMO | Admitting: Internal Medicine

## 2024-01-29 DIAGNOSIS — F4322 Adjustment disorder with anxiety: Secondary | ICD-10-CM | POA: Diagnosis not present

## 2024-01-29 DIAGNOSIS — F3341 Major depressive disorder, recurrent, in partial remission: Secondary | ICD-10-CM | POA: Diagnosis not present

## 2024-02-06 ENCOUNTER — Encounter: Payer: Self-pay | Admitting: Internal Medicine

## 2024-02-06 ENCOUNTER — Ambulatory Visit (INDEPENDENT_AMBULATORY_CARE_PROVIDER_SITE_OTHER): Payer: Medicare HMO | Admitting: Internal Medicine

## 2024-02-06 VITALS — BP 124/78 | HR 63 | Ht 62.5 in | Wt 164.0 lb

## 2024-02-06 DIAGNOSIS — E785 Hyperlipidemia, unspecified: Secondary | ICD-10-CM | POA: Diagnosis not present

## 2024-02-06 DIAGNOSIS — F331 Major depressive disorder, recurrent, moderate: Secondary | ICD-10-CM

## 2024-02-06 DIAGNOSIS — Z0000 Encounter for general adult medical examination without abnormal findings: Secondary | ICD-10-CM

## 2024-02-06 DIAGNOSIS — E118 Type 2 diabetes mellitus with unspecified complications: Secondary | ICD-10-CM | POA: Diagnosis not present

## 2024-02-06 DIAGNOSIS — Z7984 Long term (current) use of oral hypoglycemic drugs: Secondary | ICD-10-CM

## 2024-02-06 DIAGNOSIS — M10079 Idiopathic gout, unspecified ankle and foot: Secondary | ICD-10-CM

## 2024-02-06 DIAGNOSIS — E1169 Type 2 diabetes mellitus with other specified complication: Secondary | ICD-10-CM | POA: Diagnosis not present

## 2024-02-06 DIAGNOSIS — K219 Gastro-esophageal reflux disease without esophagitis: Secondary | ICD-10-CM | POA: Diagnosis not present

## 2024-02-06 DIAGNOSIS — I10 Essential (primary) hypertension: Secondary | ICD-10-CM

## 2024-02-06 MED ORDER — TELMISARTAN-HCTZ 80-25 MG PO TABS
1.0000 | ORAL_TABLET | Freq: Every day | ORAL | 1 refills | Status: DC
Start: 2024-02-06 — End: 2024-06-26

## 2024-02-06 MED ORDER — ALLOPURINOL 100 MG PO TABS
100.0000 mg | ORAL_TABLET | Freq: Every day | ORAL | 1 refills | Status: DC
Start: 2024-02-06 — End: 2024-06-26

## 2024-02-06 MED ORDER — VENLAFAXINE HCL ER 150 MG PO CP24
150.0000 mg | ORAL_CAPSULE | Freq: Every day | ORAL | 1 refills | Status: DC
Start: 2024-02-06 — End: 2024-08-13

## 2024-02-06 MED ORDER — CARVEDILOL 12.5 MG PO TABS
12.5000 mg | ORAL_TABLET | Freq: Two times a day (BID) | ORAL | 1 refills | Status: DC
Start: 2024-02-06 — End: 2024-06-26

## 2024-02-06 MED ORDER — ATORVASTATIN CALCIUM 20 MG PO TABS
20.0000 mg | ORAL_TABLET | Freq: Every day | ORAL | 1 refills | Status: DC
Start: 2024-02-06 — End: 2024-06-26

## 2024-02-06 MED ORDER — SITAGLIPTIN PHOSPHATE 100 MG PO TABS
100.0000 mg | ORAL_TABLET | Freq: Every day | ORAL | 1 refills | Status: DC
Start: 2024-02-06 — End: 2024-06-26

## 2024-02-06 MED ORDER — AMLODIPINE BESYLATE 5 MG PO TABS
5.0000 mg | ORAL_TABLET | Freq: Every day | ORAL | 1 refills | Status: DC
Start: 2024-02-06 — End: 2024-05-15

## 2024-02-06 NOTE — Progress Notes (Addendum)
Date:  02/06/2024   Name:  Samantha Lang   DOB:  [birth year]   MRN:  [id number]   Chief Complaint: Annual Exam Samantha Lang is a 83 y.o. female who presents today for her Complete Annual Exam. She feels well. She reports exercising. She reports she is sleeping well. Breast complaints - none.  Health Maintenance  Topic Date Due   Eye exam for diabetics  07/05/2023   Medicare Annual Wellness Visit  02/17/2024   Flu Shot  03/25/2024*   Zoster (Shingles) Vaccine (1 of 2) 05/05/2024*   Hemoglobin A1C  05/13/2024   Mammogram  09/17/2024   Yearly kidney function blood test for diabetes  11/13/2024   Yearly kidney health urinalysis for diabetes  11/13/2024   Complete foot exam   11/13/2024   DTaP/Tdap/Td vaccine (3 - Td or Tdap) 10/28/2030   Pneumonia Vaccine  Completed   DEXA scan (bone density measurement)  Completed   HPV Vaccine  Aged Out   COVID-19 Vaccine  Discontinued   Hepatitis C Screening  Discontinued  *Topic was postponed. The date shown is not the original due date.     Hypertension This is a chronic problem. The problem is controlled. Pertinent negatives include no chest pain, headaches, palpitations or shortness of breath. Past treatments include angiotensin blockers, calcium channel blockers, beta blockers and diuretics. The current treatment provides significant improvement. There is no history of kidney disease, CAD/MI or CVA.  Hyperlipidemia This is a chronic problem. The problem is controlled. Pertinent negatives include no chest pain or shortness of breath. Current antihyperlipidemic treatment includes statins. The current treatment provides significant improvement of lipids.  Diabetes She presents for her follow-up diabetic visit. She has type 2 diabetes mellitus. Her disease course has been stable. Pertinent negatives for hypoglycemia include no dizziness, headaches or tremors. Pertinent negatives for diabetes include no chest pain, no fatigue, no polydipsia,  no polyuria and no weakness. Pertinent negatives for diabetic complications include no CVA.    Review of Systems  Constitutional:  Negative for appetite change, fatigue, fever and unexpected weight change.  HENT:  Negative for nosebleeds, tinnitus and trouble swallowing.   Eyes:  Negative for visual disturbance.  Respiratory:  Negative for cough, chest tightness, shortness of breath and wheezing.   Cardiovascular:  Negative for chest pain, palpitations and leg swelling.  Gastrointestinal:  Negative for abdominal pain, constipation and diarrhea.  Endocrine: Negative for polydipsia and polyuria.  Genitourinary:  Negative for dysuria and hematuria.  Musculoskeletal:  Negative for arthralgias.  Neurological:  Negative for dizziness, tremors, weakness, light-headedness, numbness and headaches.  Psychiatric/Behavioral:  Negative for dysphoric mood.      Lab Results  Component Value Date   NA 140 11/14/2023   K 3.9 11/14/2023   CO2 27 11/14/2023   GLUCOSE 147 (H) 11/14/2023   BUN 23 11/14/2023   CREATININE 0.82 11/14/2023   CALCIUM 9.1 11/14/2023   EGFR 72 11/14/2023   GFRNONAA 54 (L) 11/23/2020   Lab Results  Component Value Date   CHOL 162 01/04/2023   HDL 60 01/04/2023   LDLCALC 52 01/04/2023   LDLDIRECT 138.3 09/26/2011   TRIG 330 (H) 01/04/2023   CHOLHDL 2.7 01/04/2023   Lab Results  Component Value Date   TSH 3.330 08/31/2022   Lab Results  Component Value Date   HGBA1C 7.7 (H) 11/14/2023   Lab Results  Component Value Date   WBC 6.5 08/31/2022   HGB 12.2 08/31/2022   HCT 35.7 08/31/2022  MCV 92 08/31/2022   PLT CANCELED 08/31/2022   Lab Results  Component Value Date   ALT 21 01/04/2023   AST 22 01/04/2023   ALKPHOS 90 01/04/2023   BILITOT 0.7 01/04/2023   No results found for: "25OHVITD2", "25OHVITD3", "VD25OH"   Patient Active Problem List   Diagnosis Date Noted   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   Nodule of  apex of left lung 09/14/2021   Biceps tendinitis of right upper extremity 09/02/2021   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   LVH (left ventricular hypertrophy) due to hypertensive disease, without heart failure 04/11/2019   Ganglion cyst of left foot 08/29/2018   Xerosis of skin 08/29/2018   History of endometrial cancer 08/03/2016   Type II diabetes mellitus with complication (HCC) 03/14/2016   GERD without esophagitis 10/02/2015   Hyperlipidemia associated with type 2 diabetes mellitus (HCC) 04/08/2015   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   Bilateral carotid artery stenosis 03/26/2015   Anxiety    Environmental and seasonal allergies    Postmenopausal atrophic vaginitis    Depression, major, recurrent, moderate (HCC)    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 Shirlee Limerick, eyelid lift   POLYPECTOMY  09/22/2017   Procedure: POLYPECTOMY;  Surgeon: Midge Minium, MD;  Location: [facility];  Service: Gastroenterology;;   TUBAL LIGATION      Social History   Tobacco Use   Smoking status: Former    Current packs/day: 0.00    Average packs/day: 0.3 packs/day for 20.0 years (5.0 ttl pk-yrs)    Types: Cigarettes    Start date: 09/25/1968    Quit date: 09/25/1988    Years since quitting: 35.3   Smokeless tobacco: Never  Vaping Use   Vaping status: Never Used  Substance Use Topics   Alcohol use: Yes    Alcohol/week: 6.0 standard  drinks of alcohol    Types: 2 Glasses of wine, 2 Cans of beer, 2 Shots of liquor per week    Comment: 2 times a day. 14 a week   Drug use: Never     Medication list has been reviewed and updated.  Current Meds  Medication Sig   aspirin 81 MG tablet Take 81 mg by mouth daily.   hydrocortisone (ANUSOL-HC) 2.5 % rectal cream Place 1 Application rectally 2 (two) times daily.   Multiple Vitamin (MULTIVITAMIN) capsule Take 1 capsule by mouth daily.   pantoprazole (PROTONIX) 40 MG tablet TAKE ONE (1) TABLET BY MOUTH ONCE DAILY   triamcinolone cream (KENALOG) 0.1 % Apply 1 Application topically 2 (two) times daily.   VITAMIN D PO Take 5,000 Units by mouth daily.   [DISCONTINUED] allopurinol (ZYLOPRIM) 100 MG tablet TAKE (1) TABLET BY MOUTH EVERY DAY   [DISCONTINUED] amLODipine (NORVASC) 5 MG tablet TAKE ONE (1) TABLET BY MOUTH TWO (2) (TWO) TIMES DAILY.   [DISCONTINUED] atorvastatin (LIPITOR) 20 MG tablet  Take 1 tablet (20 mg total) by mouth daily.   [DISCONTINUED] carvedilol (COREG) 12.5 MG tablet Take 1 tablet (12.5 mg total) by mouth 2 (two) times daily.   [DISCONTINUED] sitaGLIPtin (JANUVIA) 100 MG tablet TAKE (1) TABLET BY MOUTH EVERY DAY   [DISCONTINUED] telmisartan-hydrochlorothiazide (MICARDIS HCT) 80-25 MG tablet TAKE (1) TABLET BY MOUTH EVERY DAY   [DISCONTINUED] venlafaxine XR (EFFEXOR-XR) 150 MG 24 hr capsule TAKE ONE (1) CAPSULE BY MOUTH DAILY WITH BREAKFAST.       02/06/2024   10:32 AM 11/14/2023   10:26 AM 05/05/2023   10:56 AM 04/03/2023    9:07 AM  GAD 7 : Generalized Anxiety Score  Nervous, Anxious, on Edge 0 1 2 2   Control/stop worrying 0 1 2 2   Worry too much - different things 0 1 2 2   Trouble relaxing 0 1 0 2  Restless 0 1 0 2  Easily annoyed or irritable 0 1 1 1   Afraid - awful might happen 0 1 1 1   Total GAD 7 Score 0 7 8 12   Anxiety Difficulty Not difficult at all Somewhat difficult Somewhat difficult Somewhat difficult       02/06/2024   10:32 AM 11/14/2023    10:25 AM 05/05/2023   10:56 AM  Depression screen PHQ 2/9  Decreased Interest 0 2 0  Down, Depressed, Hopeless 0 2 0  PHQ - 2 Score 0 4 0  Altered sleeping 0 2 1  Tired, decreased energy 0 2 0  Change in appetite 0 0 2  Feeling bad or failure about yourself  0 2 0  Trouble concentrating 0 2 0  Moving slowly or fidgety/restless 0 1 0  Suicidal thoughts 0 0 0  PHQ-9 Score 0 13 3  Difficult doing work/chores Not difficult at all Very difficult Very difficult    BP Readings from Last 3 Encounters:  02/06/24 124/78  12/30/23 (!) 147/94  11/14/23 126/78    Physical Exam Vitals and nursing note reviewed.  Constitutional:      General: She is not in acute distress.    Appearance: She is well-developed.  HENT:     Head: Normocephalic and atraumatic.     Right Ear: Tympanic membrane and ear canal normal.     Left Ear: Tympanic membrane and ear canal normal.     Nose:     Right Sinus: No maxillary sinus tenderness.     Left Sinus: No maxillary sinus tenderness.  Eyes:     General: No scleral icterus.       Right eye: No discharge.        Left eye: No discharge.     Conjunctiva/sclera: Conjunctivae normal.  Neck:     Thyroid: No thyromegaly.     Vascular: No carotid bruit.  Cardiovascular:     Rate and Rhythm: Normal rate and regular rhythm.     Pulses: Normal pulses.     Heart sounds: Normal heart sounds.  Pulmonary:     Effort: Pulmonary effort is normal. No respiratory distress.     Breath sounds: No wheezing.  Abdominal:     General: Bowel sounds are normal.     Palpations: Abdomen is soft.     Tenderness: There is no abdominal tenderness.  Musculoskeletal:     Cervical back: Normal range of motion. No erythema.     Right lower leg: No edema.     Left lower leg: No edema.  Lymphadenopathy:     Cervical: No cervical adenopathy.  Skin:    General: Skin is warm and dry.     Findings: No rash.  Neurological:     Mental Status: She is alert and oriented to person,  place, and time.     Cranial Nerves: No cranial nerve deficit.     Sensory: No sensory deficit.     Deep Tendon Reflexes: Reflexes are normal and symmetric.  Psychiatric:        Attention and Perception: Attention normal.        Mood and Affect: Mood normal.     Wt Readings from Last 3 Encounters:  02/06/24 164 lb (74.4 kg)  12/30/23 170 lb (77.1 kg)  11/14/23 167 lb (75.8 kg)    BP 124/78   Pulse 63   Ht 5' 2.5" (1.588 m)   Wt 164 lb (74.4 kg)   SpO2 98%   BMI 29.52 kg/m   Assessment and Plan:  Problem List Items Addressed This Visit       Unprioritized   Essential hypertension (Chronic)   Controlled BP with normal exam. Current regimen is micardis hct, amlodipine and Coreg. Will continue same medications; encourage continued reduced sodium diet.       Relevant Medications   atorvastatin (LIPITOR) 20 MG tablet   telmisartan-hydrochlorothiazide (MICARDIS HCT) 80-25 MG tablet   carvedilol (COREG) 12.5 MG tablet   amLODipine (NORVASC) 5 MG tablet   Other Relevant Orders   CBC with Differential/Platelet   Comprehensive metabolic panel   TSH   Depression, major, recurrent, moderate (HCC) (Chronic)   Relevant Medications   venlafaxine XR (EFFEXOR-XR) 150 MG 24 hr capsule   Other Relevant Orders   TSH   Hyperlipidemia associated with type 2 diabetes mellitus (HCC) (Chronic)   LDL is  Lab Results  Component Value Date   LDLCALC 52 01/04/2023   Current regimen is atorvastatin.  Tolerating medications well without issues.       Relevant Medications   sitaGLIPtin (JANUVIA) 100 MG tablet   atorvastatin (LIPITOR) 20 MG tablet   telmisartan-hydrochlorothiazide (MICARDIS HCT) 80-25 MG tablet   carvedilol (COREG) 12.5 MG tablet   amLODipine (NORVASC) 5 MG tablet   Other Relevant Orders   Lipid panel   Type II diabetes mellitus with complication (HCC) (Chronic)   Blood sugars stable without hypoglycemic symptoms or events. Currently managed with [place]. Changes  made last visit are none. Lab Results  Component Value Date   HGBA1C 7.7 (H) 11/14/2023         Relevant Medications   sitaGLIPtin (JANUVIA) 100 MG tablet   atorvastatin (LIPITOR) 20 MG tablet   telmisartan-hydrochlorothiazide (MICARDIS HCT) 80-25 MG tablet   Other Relevant Orders   Comprehensive metabolic panel   Hemoglobin A1c   GERD without esophagitis   Other Visit Diagnoses       Annual physical exam    -  Primary     Idiopathic gout of foot, unspecified chronicity, unspecified laterality       Relevant Medications   allopurinol (ZYLOPRIM) 100 MG tablet   Other Relevant Orders   Uric acid     Long term current use of oral hypoglycemic drug           Return in about 4 months (around 06/05/2024) for DM, HTN.    Reubin Milan, MD [facility] Primary [facility] and Sports Medicine [place]

## 2024-02-06 NOTE — Assessment & Plan Note (Signed)
Controlled BP with normal exam. Current regimen is micardis hct, amlodipine and Coreg. Will continue same medications; encourage continued reduced sodium diet.

## 2024-02-06 NOTE — Assessment & Plan Note (Signed)
LDL is  Lab Results  Component Value Date   LDLCALC 52 01/04/2023   Current regimen is atorvastatin.  Tolerating medications well without issues.

## 2024-02-06 NOTE — Assessment & Plan Note (Signed)
Blood sugars stable without hypoglycemic symptoms or events. Currently managed with [place]. Changes made last visit are none. Lab Results  Component Value Date   HGBA1C 7.7 (H) 11/14/2023

## 2024-02-07 DIAGNOSIS — F4322 Adjustment disorder with anxiety: Secondary | ICD-10-CM | POA: Diagnosis not present

## 2024-02-07 DIAGNOSIS — F3341 Major depressive disorder, recurrent, in partial remission: Secondary | ICD-10-CM | POA: Diagnosis not present

## 2024-02-16 DIAGNOSIS — F3341 Major depressive disorder, recurrent, in partial remission: Secondary | ICD-10-CM | POA: Diagnosis not present

## 2024-02-16 DIAGNOSIS — F4322 Adjustment disorder with anxiety: Secondary | ICD-10-CM | POA: Diagnosis not present

## 2024-02-18 ENCOUNTER — Encounter: Payer: Self-pay | Admitting: Internal Medicine

## 2024-02-21 ENCOUNTER — Ambulatory Visit (INDEPENDENT_AMBULATORY_CARE_PROVIDER_SITE_OTHER): Payer: Medicare HMO

## 2024-02-21 DIAGNOSIS — Z0000 Encounter for general adult medical examination without abnormal findings: Secondary | ICD-10-CM | POA: Diagnosis not present

## 2024-02-21 DIAGNOSIS — F4322 Adjustment disorder with anxiety: Secondary | ICD-10-CM | POA: Diagnosis not present

## 2024-02-21 DIAGNOSIS — F3341 Major depressive disorder, recurrent, in partial remission: Secondary | ICD-10-CM | POA: Diagnosis not present

## 2024-02-21 NOTE — Progress Notes (Signed)
 Subjective:   Samantha Lang is a 83 y.o. who presents for a Medicare Wellness preventive visit.  Visit Complete: Virtual I connected with  Theodore Demark on 02/21/24 by a audio enabled telemedicine application and verified that I am speaking with the correct person using two identifiers.  Patient Location: Home  Provider Location: Office/Clinic  I discussed the limitations of evaluation and management by telemedicine. The patient expressed understanding and agreed to proceed.  Vital Signs: Because this visit was a virtual/telehealth visit, some criteria may be missing or patient reported. Any vitals not documented were not able to be obtained and vitals that have been documented are patient reported.  VideoDeclined- This patient declined Librarian, academic. Therefore the visit was completed with audio only.  AWV Questionnaire: No: Patient Medicare AWV questionnaire was not completed prior to this visit.  Cardiac Risk Factors include: advanced age (>70men, >55 women);dyslipidemia;diabetes mellitus;hypertension;sedentary lifestyle     Objective:    Today's Vitals   02/21/24 1518  PainSc: 0-No pain   There is no height or weight on file to calculate BMI.     02/21/2024    3:24 PM 08/31/2023    9:29 AM 02/16/2023   11:04 AM 02/17/2022   10:54 AM 01/17/2022   11:42 AM 01/13/2021   11:58 AM 11/02/2020    9:33 AM  Advanced Directives  Does Patient Have a Medical Advance Directive? No Yes No Yes No Yes No  Type of Special educational needs teacher of [place];Living will  Healthcare Power of [place];Living will  Healthcare Power of [place];Living will   Copy of Healthcare Power of Attorney in Chart?      No - copy requested   Would patient like information on creating a medical advance directive? No - Patient declined  No - Patient declined  No - Patient declined      Current Medications (verified) Outpatient Encounter Medications as of 02/21/2024   Medication Sig   allopurinol (ZYLOPRIM) 100 MG tablet Take 1 tablet (100 mg total) by mouth daily.   amLODipine (NORVASC) 5 MG tablet Take 1 tablet (5 mg total) by mouth daily.   aspirin 81 MG tablet Take 81 mg by mouth daily.   atorvastatin (LIPITOR) 20 MG tablet Take 1 tablet (20 mg total) by mouth daily.   carvedilol (COREG) 12.5 MG tablet Take 1 tablet (12.5 mg total) by mouth 2 (two) times daily.   hydrocortisone (ANUSOL-HC) 2.5 % rectal cream Place 1 Application rectally 2 (two) times daily.   Multiple Vitamin (MULTIVITAMIN) capsule Take 1 capsule by mouth daily.   pantoprazole (PROTONIX) 40 MG tablet TAKE ONE (1) TABLET BY MOUTH ONCE DAILY   sitaGLIPtin (JANUVIA) 100 MG tablet Take 1 tablet (100 mg total) by mouth daily.   telmisartan-hydrochlorothiazide (MICARDIS HCT) 80-25 MG tablet Take 1 tablet by mouth daily.   triamcinolone cream (KENALOG) 0.1 % Apply 1 Application topically 2 (two) times daily.   venlafaxine XR (EFFEXOR-XR) 150 MG 24 hr capsule Take 1 capsule (150 mg total) by mouth daily with breakfast.   VITAMIN D PO Take 5,000 Units by mouth daily.   No facility-administered encounter medications on file as of 02/21/2024.    Allergies (verified) Cephalexin and Clarithromycin   History: Past Medical History:  Diagnosis Date   Acquired trigger finger 01/13/2021   Allergy    Anxiety    Aortic valve stenosis, mild    Depression    mild   Diabetes mellitus age 79  GERD (gastroesophageal reflux disease)    Heart murmur    Hemorrhoids    Hyperlipidemia    Hypertension 2003   Incontinence    Female stress   Mitral incompetence    Personal history of chemotherapy    3 treatments   Personal history of radiation therapy    5 treatments   Postmenopausal atrophic vaginitis    Radial styloid tenosynovitis 01/13/2021   Torn rotator cuff    Trochanteric bursitis of left hip 12/07/2016   Uterine cancer [facility])    treated   Past Surgical History:  Procedure  Laterality Date   ABDOMINAL HYSTERECTOMY  07/2016   BREAST BIOPSY Right 03/21/08   right, benign    COLONOSCOPY WITH PROPOFOL N/A 09/22/2017   Procedure: COLONOSCOPY WITH PROPOFOL;  Surgeon: Midge Minium, MD;  Location: [facility];  Service: Gastroenterology;  Laterality: N/A;  diabetic   EYE SURGERY  Oct 2009   for ptosis,  Dr. Shirlee Limerick, eyelid lift   POLYPECTOMY  09/22/2017   Procedure: POLYPECTOMY;  Surgeon: Midge Minium, MD;  Location: [facility];  Service: Gastroenterology;;   TUBAL LIGATION     Family History  Problem Relation Age of Onset   Breast cancer Paternal Aunt    Breast cancer Paternal Grandmother 57   Cancer Father 59       lung   Stroke Mother    Hypertension Mother    Cancer Brother        prostate   Depression Brother    Ovarian cancer Neg Hx    Colon cancer Neg Hx    Diabetes Neg Hx    Social History   Socioeconomic History   Marital status: Married    Spouse name: Blanchie Serve   Number of children: 2   Years of education: 63   Highest education level: Master's degree (e.g., MA, MS, MEng, MEd, MSW, MBA)  Occupational History   Occupation: Retired    Comment: Freight forwarder  Tobacco Use   Smoking status: Former    Current packs/day: 0.00    Average packs/day: 0.3 packs/day for 20.0 years (5.0 ttl pk-yrs)    Types: Cigarettes    Start date: 09/25/1968    Quit date: 09/25/1988    Years since quitting: 35.4   Smokeless tobacco: Never  Vaping Use   Vaping status: Never Used  Substance and Sexual Activity   Alcohol use: Yes    Alcohol/week: 6.0 standard drinks of alcohol    Types: 2 Glasses of wine, 2 Cans of beer, 2 Shots of liquor per week    Comment: 2 times a day. 14 a week   Drug use: Never   Sexual activity: Yes    Partners: Male    Birth control/protection: Surgical  Other Topics Concern   Not on file  Social History Narrative   Not on file   Social Drivers of Health   Financial Resource Strain: Low Risk  (02/21/2024)    Overall Financial Resource Strain (CARDIA)    Difficulty of Paying Living Expenses: Not hard at all  Food Insecurity: No Food Insecurity (02/21/2024)   Hunger Vital Sign    Worried About Running Out of Food in the Last Year: Never true    Ran Out of Food in the Last Year: Never true  Transportation Needs: No Transportation Needs (02/21/2024)   PRAPARE - Administrator, Civil Service (Medical): No    Lack of Transportation (Non-Medical): No  Physical Activity: Inactive (02/21/2024)   Exercise  Vital Sign    Days of Exercise per Week: 0 days    Minutes of Exercise per Session: 0 min  Stress: No Stress Concern Present (02/21/2024)   [employer] of Occupational Health - Occupational Stress Questionnaire    Feeling of Stress : Only a little  Social Connections: Moderately Isolated (02/21/2024)   Social Connection and Isolation Panel [NHANES]    Frequency of Communication with Friends and Family: More than three times a week    Frequency of Social Gatherings with Friends and Family: Never    Attends Religious Services: Never    Database administrator or Organizations: No    Attends Engineer, structural: Never    Marital Status: Married    Tobacco Counseling Counseling given: Not Answered    Clinical Intake:  Pre-visit preparation completed: Yes  Pain : No/denies pain Pain Score: 0-No pain     BMI - recorded: 29.5 Nutritional Status: BMI 25 -29 Overweight Nutritional Risks: None Diabetes: Yes CBG done?: No Did pt. bring in CBG monitor from home?: No  How often do you need to have someone help you when you read instructions, pamphlets, or other written materials from your doctor or pharmacy?: 1 - Never  Interpreter Needed?: No  Information entered by :: Kennedy Bucker, LPN   Activities of Daily Living     02/21/2024    3:26 PM  In your present state of health, do you have any difficulty performing the following activities:  Hearing? 0  Vision?  0  Difficulty concentrating or making decisions? 1  Comment MEMORY  Walking or climbing stairs? 0  Dressing or bathing? 0  Doing errands, shopping? 0  Preparing Food and eating ? N  Using the Toilet? N  In the past six months, have you accidently leaked urine? Y  Do you have problems with loss of bowel control? Y  Managing your Medications? N  Managing your Finances? N  Housekeeping or managing your Housekeeping? N    Patient Care Team: Reubin Milan, MD as PCP - General (Internal Medicine) Lamar Blinks, MD as Consulting Physician (Cardiology) Ignacia Palma Scharlene Gloss, MD as Referring Physician (Obstetrics and Gynecology) Vernie Murders, MD (Otolaryngology) Pa, [facility])  Indicate any recent Medical Services you may have received from other than [facility] providers in the past year (date may be approximate).     Assessment:   This is a routine wellness examination for [place].  Hearing/Vision screen Hearing Screening - Comments:: HEARING AID LEFT EAR Vision Screening - Comments:: READERS-  EYE   Goals Addressed             This Visit's Progress    DIET - INCREASE WATER INTAKE         Depression Screen     02/21/2024    3:22 PM 02/06/2024   10:32 AM 11/14/2023   10:25 AM 05/05/2023   10:56 AM 04/03/2023    9:07 AM 02/16/2023   11:01 AM 01/04/2023   10:31 AM  PHQ 2/9 Scores  PHQ - 2 Score 6 0 4 0 3 1 4   PHQ- 9 Score 6 0 13 3 7 3 6     Fall Risk     02/21/2024    3:25 PM 02/06/2024   10:32 AM 11/14/2023   10:25 AM 05/05/2023   10:56 AM 04/03/2023    9:08 AM  Fall Risk   Falls in the past year? 1 0 0 1 1  Number falls in past  yr: 0 0 0 1 1  Injury with Fall? 0 0 0 1 0  Risk for fall due to :  No Fall Risks No Fall Risks History of fall(s) History of fall(s)  Follow up Falls prevention discussed;Falls evaluation completed Falls evaluation completed Falls evaluation completed Falls evaluation completed Falls evaluation completed     MEDICARE RISK AT HOME:  Medicare Risk at Home Any stairs in or around the home?: Yes If so, are there any without handrails?: No Home free of loose throw rugs in walkways, pet beds, electrical cords, etc?: Yes Adequate lighting in your home to reduce risk of falls?: Yes Life alert?: No Use of a cane, walker or w/c?: No Grab bars in the bathroom?: Yes Shower chair or bench in shower?: Yes Elevated toilet seat or a handicapped toilet?: Yes  TIMED UP AND GO:  Was the test performed?  No  Cognitive Function: 6CIT completed        02/21/2024    3:29 PM 02/16/2023   11:08 AM 01/28/2019   10:49 AM 11/13/2017    3:56 PM  6CIT Screen  What Year? 4 points 0 points 0 points 0 points  What month? 0 points 0 points 0 points 0 points  What time? 0 points 0 points 0 points 3 points  Count back from 20 0 points 0 points 0 points 0 points  Months in reverse  0 points 0 points 0 points  Repeat phrase 6 points 4 points 4 points 4 points  Total Score  4 points 4 points 7 points    Immunizations Immunization History  Administered Date(s) Administered   Fluad Quad(high Dose 65+) 10/09/2019   Influenza, High Dose Seasonal PF 11/12/2018   Influenza,inj,Quad PF,6+ Mos 10/02/2015, 11/07/2016, 11/01/2017   Pneumococcal Conjugate-13 02/05/2014   Pneumococcal Polysaccharide-23 06/09/2008   Tdap 06/09/2010, 10/28/2020    Screening Tests Health Maintenance  Topic Date Due   OPHTHALMOLOGY EXAM  07/05/2023   INFLUENZA VACCINE  03/25/2024 (Originally 07/27/2023)   Zoster Vaccines- Shingrix (1 of 2) 05/05/2024 (Originally 11/12/1992)   HEMOGLOBIN A1C  05/13/2024   MAMMOGRAM  09/17/2024   Diabetic kidney evaluation - eGFR measurement  11/13/2024   Diabetic kidney evaluation - Urine ACR  11/13/2024   FOOT EXAM  11/13/2024   Medicare Annual Wellness (AWV)  02/20/2025   DEXA SCAN  03/04/2025   DTaP/Tdap/Td (3 - Td or Tdap) 10/28/2030   Pneumonia Vaccine 54+ Years old  Completed   HPV VACCINES   Aged Out   COVID-19 Vaccine  Discontinued   Hepatitis C Screening  Discontinued    Health Maintenance  Health Maintenance Due  Topic Date Due   OPHTHALMOLOGY EXAM  07/05/2023   Health Maintenance Items Addressed:YES   Additional Screening:  Vision Screening: Recommended annual ophthalmology exams for early detection of glaucoma and other disorders of the eye.  Dental Screening: Recommended annual dental exams for proper oral hygiene  Community Resource Referral / Chronic Care Management: CRR required this visit?  No   CCM required this visit?  No     Plan:     I have personally reviewed and noted the following in the patient's chart:   Medical and social history Use of alcohol, tobacco or illicit drugs  Current medications and supplements including opioid prescriptions. Patient is not currently taking opioid prescriptions. Functional ability and status Nutritional status Physical activity Advanced directives List of other physicians Hospitalizations, surgeries, and ER visits in previous 12 months Vitals Screenings to include cognitive, depression,  and falls Referrals and appointments  In addition, I have reviewed and discussed with patient certain preventive protocols, quality metrics, and best practice recommendations. A written personalized care plan for preventive services as well as general preventive health recommendations were provided to patient.     Hal Hope, LPN   1/61/0960   After Visit Summary: (MyChart) Due to this being a telephonic visit, the after visit summary with patients personalized plan was offered to patient via MyChart   Notes: Nothing significant to report at this time.

## 2024-02-21 NOTE — Patient Instructions (Addendum)
 Samantha Lang , Thank you for taking time to come for your Medicare Wellness Visit. I appreciate your ongoing commitment to your health goals. Please review the following plan we discussed and let me know if I can assist you in the future.   Referrals/Orders/Follow-Ups/Clinician Recommendations: NONE  This is a list of the screening recommended for you and due dates:  Health Maintenance  Topic Date Due   Eye exam for diabetics  07/05/2023   Flu Shot  03/25/2024*   Zoster (Shingles) Vaccine (1 of 2) 05/05/2024*   Hemoglobin A1C  05/13/2024   Mammogram  09/17/2024   Yearly kidney function blood test for diabetes  11/13/2024   Yearly kidney health urinalysis for diabetes  11/13/2024   Complete foot exam   11/13/2024   Medicare Annual Wellness Visit  02/20/2025   DEXA scan (bone density measurement)  03/04/2025   DTaP/Tdap/Td vaccine (3 - Td or Tdap) 10/28/2030   Pneumonia Vaccine  Completed   HPV Vaccine  Aged Out   COVID-19 Vaccine  Discontinued   Hepatitis C Screening  Discontinued  *Topic was postponed. The date shown is not the original due date.    Advanced directives: (ACP Link)Information on Advanced Care Planning can be found at [facility] [place] Advance Health Care Directives Advance Health Care Directives ([email or web address]   Next Medicare Annual Wellness Visit scheduled for next year: Yes   02/26/25 @ 2:00 PM BY PHONE

## 2024-02-26 DIAGNOSIS — E118 Type 2 diabetes mellitus with unspecified complications: Secondary | ICD-10-CM | POA: Diagnosis not present

## 2024-02-26 DIAGNOSIS — I10 Essential (primary) hypertension: Secondary | ICD-10-CM | POA: Diagnosis not present

## 2024-02-26 DIAGNOSIS — E1169 Type 2 diabetes mellitus with other specified complication: Secondary | ICD-10-CM | POA: Diagnosis not present

## 2024-02-26 DIAGNOSIS — E785 Hyperlipidemia, unspecified: Secondary | ICD-10-CM | POA: Diagnosis not present

## 2024-02-26 DIAGNOSIS — M10079 Idiopathic gout, unspecified ankle and foot: Secondary | ICD-10-CM | POA: Diagnosis not present

## 2024-02-26 DIAGNOSIS — F331 Major depressive disorder, recurrent, moderate: Secondary | ICD-10-CM | POA: Diagnosis not present

## 2024-02-27 ENCOUNTER — Encounter: Payer: Self-pay | Admitting: Internal Medicine

## 2024-02-27 LAB — CBC WITH DIFFERENTIAL/PLATELET
Basophils Absolute: 0.1 10*3/uL (ref 0.0–0.2)
Basos: 1 %
EOS (ABSOLUTE): 0.4 10*3/uL (ref 0.0–0.4)
Eos: 7 %
Hematocrit: 35.7 % (ref 34.0–46.6)
Hemoglobin: 12.2 g/dL (ref 11.1–15.9)
Immature Grans (Abs): 0 10*3/uL (ref 0.0–0.1)
Immature Granulocytes: 0 %
Lymphocytes Absolute: 2.1 10*3/uL (ref 0.7–3.1)
Lymphs: 34 %
MCH: 31.7 pg (ref 26.6–33.0)
MCHC: 34.2 g/dL (ref 31.5–35.7)
MCV: 93 fL (ref 79–97)
Monocytes Absolute: 0.5 10*3/uL (ref 0.1–0.9)
Monocytes: 8 %
Neutrophils Absolute: 3.1 10*3/uL (ref 1.4–7.0)
Neutrophils: 50 %
RBC: 3.85 x10E6/uL (ref 3.77–5.28)
RDW: 13 % (ref 11.7–15.4)
WBC: 6.2 10*3/uL (ref 3.4–10.8)

## 2024-02-27 LAB — LIPID PANEL
Chol/HDL Ratio: 2.7 ratio (ref 0.0–4.4)
Cholesterol, Total: 177 mg/dL (ref 100–199)
HDL: 65 mg/dL (ref >39)
LDL Chol Calc (NIH): 74 mg/dL (ref 0–99)
Triglycerides: 235 mg/dL — ABNORMAL HIGH (ref 0–149)
VLDL Cholesterol Cal: 38 mg/dL (ref 5–40)

## 2024-02-27 LAB — COMPREHENSIVE METABOLIC PANEL
ALT: 20 IU/L (ref 0–32)
AST: 23 IU/L (ref 0–40)
Albumin: 4 g/dL (ref 3.7–4.7)
Alkaline Phosphatase: 83 IU/L (ref 44–121)
BUN/Creatinine Ratio: 30 — ABNORMAL HIGH (ref 12–28)
BUN: 25 mg/dL (ref 8–27)
Bilirubin Total: 0.4 mg/dL (ref 0.0–1.2)
CO2: 27 mmol/L (ref 20–29)
Calcium: 9.2 mg/dL (ref 8.7–10.3)
Chloride: 98 mmol/L (ref 96–106)
Creatinine, Ser: 0.84 mg/dL (ref 0.57–1.00)
Globulin, Total: 2.5 g/dL (ref 1.5–4.5)
Glucose: 151 mg/dL — ABNORMAL HIGH (ref 70–99)
Potassium: 3.6 mmol/L (ref 3.5–5.2)
Sodium: 139 mmol/L (ref 134–144)
Total Protein: 6.5 g/dL (ref 6.0–8.5)
eGFR: 70 mL/min/{1.73_m2} (ref >59)

## 2024-02-27 LAB — HEMOGLOBIN A1C
Est. average glucose Bld gHb Est-mCnc: 171 mg/dL
Hgb A1c MFr Bld: 7.6 % — ABNORMAL HIGH (ref 4.8–5.6)

## 2024-02-27 LAB — URIC ACID: Uric Acid: 7 mg/dL (ref 3.1–7.9)

## 2024-02-27 LAB — TSH: TSH: 3.05 u[IU]/mL (ref 0.450–4.500)

## 2024-02-28 DIAGNOSIS — F3341 Major depressive disorder, recurrent, in partial remission: Secondary | ICD-10-CM | POA: Diagnosis not present

## 2024-02-28 DIAGNOSIS — F4322 Adjustment disorder with anxiety: Secondary | ICD-10-CM | POA: Diagnosis not present

## 2024-03-07 DIAGNOSIS — F4322 Adjustment disorder with anxiety: Secondary | ICD-10-CM | POA: Diagnosis not present

## 2024-03-07 DIAGNOSIS — F3341 Major depressive disorder, recurrent, in partial remission: Secondary | ICD-10-CM | POA: Diagnosis not present

## 2024-03-12 ENCOUNTER — Ambulatory Visit (INDEPENDENT_AMBULATORY_CARE_PROVIDER_SITE_OTHER): Payer: Medicare HMO | Admitting: Gastroenterology

## 2024-03-12 ENCOUNTER — Encounter: Payer: Self-pay | Admitting: Gastroenterology

## 2024-03-12 VITALS — BP 152/62 | HR 65 | Temp 97.8°F | Ht 62.5 in | Wt 165.5 lb

## 2024-03-12 DIAGNOSIS — K5900 Constipation, unspecified: Secondary | ICD-10-CM | POA: Diagnosis not present

## 2024-03-12 DIAGNOSIS — K5904 Chronic idiopathic constipation: Secondary | ICD-10-CM

## 2024-03-12 NOTE — Progress Notes (Signed)
 Arlyss Repress, MD [address]  [address]  [place], [place] [address]  Main: [phone]  Fax: [phone]    Gastroenterology Consultation  Referring Provider:     Reubin Milan, MD Primary Care Physician:  Samantha Milan, MD Primary Gastroenterologist:  Dr. Arlyss Repress Reason for Consultation: Overflow diarrhea secondary to constipation        HPI:   Samantha Lang is a 83 y.o. female referred by Dr. Judithann Lang, Samantha Cowden, MD  for consultation & management of chronic diarrhea.  Patient reports that she was diagnosed with IBS and has been having intermittent episodes of diarrhea.  Over the last 1 year, her diarrheal symptoms have worsened, has watery bowel movements about 3-4 times daily.  Within last 1 week, stools are mushy and only 1 a day.  She does report feeling gassy and significantly bloated.  She denies nocturnal diarrhea.  She was seen by Dr. Maximino Lang for her symptoms in the past, was advised to take fiber.  It did not help.  Diarrhea is impairing her quality of life, she is afraid of going out due to fear of not making it to the bathroom.  She denies any particular relation to foods.  She does consume bacon and sausage and her breakfast on a daily basis.  She likes to eat sweets.  Does have diabetes  Follow-up visit 09/18/2023 Since last visit, patient reports feeling significantly better, improvement in her bowel movements as well as bloating.  She underwent abdominal x-ray which revealed moderate stool burden.  Since then, she has been trying to incorporate more fiber, drinking more water, finally she had 1 formed stool today.  She reports that IBgard samples significantly helped with bloating.  She could not afford the stool specimen for H. pylori and pancreatic fecal elastase levels.  Celiac disease panel negative  Follow-up visit 03/12/2024 [name] is here for follow-up of constipation.  Since last visit, she has been taking MiraLAX intermittently, she had  an episode of severe constipation, she took 3 doses of MiraLAX of her granddaughter's which helped to have several bowel movements.  Followed by this, she was having formed bowel movements.  She does not take MiraLAX daily, she states that she does not have motivation to eat healthy or stay active at her age.  She feels bloated and gassy.  She denies any abdominal pain.  Her weight has been stable.  She denies any rectal bleeding.  NSAIDs: None  Antiplts/Anticoagulants/Anti thrombotics: None  GI Procedures:  Colonoscopy 09/14/2017 for screening Sigmoid diverticulosis, otherwise normal colon  Past Medical History:  Diagnosis Date   Acquired trigger finger 01/13/2021   Allergy    Anxiety    Aortic valve stenosis, mild    Depression    mild   Diabetes mellitus age 88   GERD (gastroesophageal reflux disease)    Heart murmur    Hemorrhoids    Hyperlipidemia    Hypertension 2003   Incontinence    Female stress   Mitral incompetence    Personal history of chemotherapy    3 treatments   Personal history of radiation therapy    5 treatments   Postmenopausal atrophic vaginitis    Radial styloid tenosynovitis 01/13/2021   Torn rotator cuff    Trochanteric bursitis of left hip 12/07/2016   Uterine cancer [facility])    treated    Past Surgical History:  Procedure Laterality Date   ABDOMINAL HYSTERECTOMY  07/2016   BREAST BIOPSY  Right 03/21/08   right, benign    COLONOSCOPY WITH PROPOFOL N/A 09/22/2017   Procedure: COLONOSCOPY WITH PROPOFOL;  Surgeon: Samantha Minium, MD;  Location: [facility];  Service: Gastroenterology;  Laterality: N/A;  diabetic   EYE SURGERY  Oct 2009   for ptosis,  Dr. Shirlee Limerick, eyelid lift   POLYPECTOMY  09/22/2017   Procedure: POLYPECTOMY;  Surgeon: Samantha Minium, MD;  Location: [facility];  Service: Gastroenterology;;   TUBAL LIGATION       Current Outpatient Medications:    allopurinol (ZYLOPRIM) 100 MG tablet, Take 1 tablet (100 mg total) by  mouth daily., Disp: 90 tablet, Rfl: 1   amLODipine (NORVASC) 5 MG tablet, Take 1 tablet (5 mg total) by mouth daily., Disp: 180 tablet, Rfl: 1   aspirin 81 MG tablet, Take 81 mg by mouth daily., Disp: , Rfl:    atorvastatin (LIPITOR) 20 MG tablet, Take 1 tablet (20 mg total) by mouth daily., Disp: 90 tablet, Rfl: 1   carvedilol (COREG) 12.5 MG tablet, Take 1 tablet (12.5 mg total) by mouth 2 (two) times daily., Disp: 180 tablet, Rfl: 1   hydrocortisone (ANUSOL-HC) 2.5 % rectal cream, Place 1 Application rectally 2 (two) times daily., Disp: 30 g, Rfl: 2   Multiple Vitamin (MULTIVITAMIN) capsule, Take 1 capsule by mouth daily., Disp: , Rfl:    pantoprazole (PROTONIX) 40 MG tablet, TAKE ONE (1) TABLET BY MOUTH ONCE DAILY, Disp: 90 tablet, Rfl: 0   sitaGLIPtin (JANUVIA) 100 MG tablet, Take 1 tablet (100 mg total) by mouth daily., Disp: 90 tablet, Rfl: 1   telmisartan-hydrochlorothiazide (MICARDIS HCT) 80-25 MG tablet, Take 1 tablet by mouth daily., Disp: 90 tablet, Rfl: 1   triamcinolone cream (KENALOG) 0.1 %, Apply 1 Application topically 2 (two) times daily., Disp: 30 g, Rfl: 0   venlafaxine XR (EFFEXOR-XR) 150 MG 24 hr capsule, Take 1 capsule (150 mg total) by mouth daily with breakfast., Disp: 90 capsule, Rfl: 1   VITAMIN D PO, Take 5,000 Units by mouth daily., Disp: , Rfl:    Family History  Problem Relation Age of Onset   Breast cancer Paternal Aunt    Breast cancer Paternal Grandmother 101   Cancer Father 53       lung   Stroke Mother    Hypertension Mother    Cancer Brother        prostate   Depression Brother    Ovarian cancer Neg Hx    Colon cancer Neg Hx    Diabetes Neg Hx      Social History   Tobacco Use   Smoking status: Former    Current packs/day: 0.00    Average packs/day: 0.3 packs/day for 20.0 years (5.0 ttl pk-yrs)    Types: Cigarettes    Start date: 09/25/1968    Quit date: 09/25/1988    Years since quitting: 35.4   Smokeless tobacco: Never  Vaping Use    Vaping status: Never Used  Substance Use Topics   Alcohol use: Yes    Alcohol/week: 6.0 standard drinks of alcohol    Types: 2 Glasses of wine, 2 Cans of beer, 2 Shots of liquor per week    Comment: 2 times a day. 14 a week   Drug use: Never    Allergies as of 03/12/2024 - Review Complete 03/12/2024  Allergen Reaction Noted   Cephalexin Rash 09/26/2011   Clarithromycin Rash 09/26/2011    Review of Systems:    All systems reviewed and negative except  where noted in HPI.   Physical Exam:  BP (!) 152/62 (BP Location: Left Arm, Patient Position: Sitting, Cuff Size: Normal)   Pulse 65   Temp 97.8 F (36.6 C) (Oral)   Ht 5' 2.5" (1.588 m)   Wt 165 lb 8 oz (75.1 kg)   BMI 29.79 kg/m  No LMP recorded. Patient has had a hysterectomy.  General:   Alert,  Well-developed, well-nourished, pleasant and cooperative in NAD Head:  Normocephalic and atraumatic. Eyes:  Sclera clear, no icterus.   Conjunctiva pink. Ears:  Normal auditory acuity. Nose:  No deformity, discharge, or lesions. Mouth:  No deformity or lesions,oropharynx pink & moist. Neck:  Supple; no masses or thyromegaly. Lungs:  Respirations even and unlabored.  Clear throughout to auscultation.   No wheezes, crackles, or rhonchi. No acute distress. Heart:  Regular rate and rhythm; no murmurs, clicks, rubs, or gallops. Abdomen:  Normal bowel sounds. Soft, non-tender and moderately distended, tympanic to percussion, without masses, hepatosplenomegaly or hernias noted.  No guarding or rebound tenderness.   Rectal: Not performed Msk:  Symmetrical without gross deformities. Good, equal movement & strength bilaterally. Pulses:  Normal pulses noted. Extremities:  No clubbing or edema.  No cyanosis. Neurologic:  Alert and oriented x3;  grossly normal neurologically. Skin:  Intact without significant lesions or rashes. No jaundice. Psych:  Alert and cooperative. Normal mood and affect.  Imaging Studies: No abdominal  imaging  Assessment and Plan:   AMANA BOUSKA is a 83 y.o. female with history of diabetes, hypertension is seen in consultation for constipation with overflow diarrhea and abdominal bloating.  Currently not experiencing overflow diarrhea Symptoms have significantly improved with improving constipation celiac disease panel negative Advised her to take MiraLAX 1 capful daily along with fiber supplement such as Benefiber or Citrucel or Metamucil 1 capful with large cup of water daily Continue IBgard as needed  Follow up as needed  Arlyss Repress, MD

## 2024-03-12 NOTE — Patient Instructions (Signed)
 Take Miralax 1 cupful daily with a large glass of water.  Please call our office in 2 weeks to let us know how you are doing.

## 2024-03-27 DIAGNOSIS — F4322 Adjustment disorder with anxiety: Secondary | ICD-10-CM | POA: Diagnosis not present

## 2024-03-27 DIAGNOSIS — F3341 Major depressive disorder, recurrent, in partial remission: Secondary | ICD-10-CM | POA: Diagnosis not present

## 2024-04-03 DIAGNOSIS — F3341 Major depressive disorder, recurrent, in partial remission: Secondary | ICD-10-CM | POA: Diagnosis not present

## 2024-04-03 DIAGNOSIS — F4322 Adjustment disorder with anxiety: Secondary | ICD-10-CM | POA: Diagnosis not present

## 2024-04-11 DIAGNOSIS — F4322 Adjustment disorder with anxiety: Secondary | ICD-10-CM | POA: Diagnosis not present

## 2024-04-11 DIAGNOSIS — F3341 Major depressive disorder, recurrent, in partial remission: Secondary | ICD-10-CM | POA: Diagnosis not present

## 2024-04-19 DIAGNOSIS — F4322 Adjustment disorder with anxiety: Secondary | ICD-10-CM | POA: Diagnosis not present

## 2024-04-19 DIAGNOSIS — F3341 Major depressive disorder, recurrent, in partial remission: Secondary | ICD-10-CM | POA: Diagnosis not present

## 2024-04-23 DIAGNOSIS — F332 Major depressive disorder, recurrent severe without psychotic features: Secondary | ICD-10-CM | POA: Diagnosis not present

## 2024-05-02 DIAGNOSIS — F4322 Adjustment disorder with anxiety: Secondary | ICD-10-CM | POA: Diagnosis not present

## 2024-05-02 DIAGNOSIS — F3341 Major depressive disorder, recurrent, in partial remission: Secondary | ICD-10-CM | POA: Diagnosis not present

## 2024-05-13 ENCOUNTER — Other Ambulatory Visit: Payer: Self-pay | Admitting: Internal Medicine

## 2024-05-13 DIAGNOSIS — K219 Gastro-esophageal reflux disease without esophagitis: Secondary | ICD-10-CM

## 2024-05-13 DIAGNOSIS — I10 Essential (primary) hypertension: Secondary | ICD-10-CM

## 2024-05-15 DIAGNOSIS — F3341 Major depressive disorder, recurrent, in partial remission: Secondary | ICD-10-CM | POA: Diagnosis not present

## 2024-05-15 DIAGNOSIS — F4322 Adjustment disorder with anxiety: Secondary | ICD-10-CM | POA: Diagnosis not present

## 2024-05-15 NOTE — Telephone Encounter (Signed)
 Requested by interface surescripts. Future visit in 3 weeks. No refills remain. Requested Prescriptions  Pending Prescriptions Disp Refills   pantoprazole  (PROTONIX ) 40 MG tablet [Pharmacy Med Name: PANTOPRAZOLE  SODIUM 40MG  TABLET DR] 90 tablet 0    Sig: TAKE ONE (1) TABLET BY MOUTH ONCE DAILY     Gastroenterology: Proton Pump Inhibitors Passed - 05/15/2024 12:40 PM      Passed - Valid encounter within last 12 months    Recent Outpatient Visits           3 months ago Annual physical exam   [facility] at [facility], Chales [place], MD       Future Appointments             In 3 weeks Gala Jubilee Chales [place], MD [facility] at [facility]             amLODipine  (NORVASC ) 5 MG tablet [Pharmacy Med Name: AMLODIPINE  BESYLATE 5MG  TABLET] 180 tablet 0    Sig: TAKE ONE (1) TABLET BY MOUTH TWO (2) (TWO) TIMES DAILY.     Cardiovascular: Calcium  Channel Blockers 2 Failed - 05/15/2024 12:40 PM      Failed - Last BP in normal range    BP Readings from Last 1 Encounters:  03/12/24 (!) 152/62         Passed - Last Heart Rate in normal range    Pulse Readings from Last 1 Encounters:  03/12/24 65         Passed - Valid encounter within last 6 months    Recent Outpatient Visits           3 months ago Annual physical exam   [facility] at [facility], Chales [place], MD       Future Appointments             In 3 weeks Gala Jubilee Chales [place], MD [facility] at [facility], [facility]

## 2024-05-28 ENCOUNTER — Ambulatory Visit: Admitting: Internal Medicine

## 2024-05-31 ENCOUNTER — Encounter: Payer: Self-pay | Admitting: Internal Medicine

## 2024-05-31 ENCOUNTER — Ambulatory Visit (INDEPENDENT_AMBULATORY_CARE_PROVIDER_SITE_OTHER): Admitting: Internal Medicine

## 2024-05-31 VITALS — BP 128/76 | HR 71 | Ht 62.5 in | Wt 167.2 lb

## 2024-05-31 DIAGNOSIS — I10 Essential (primary) hypertension: Secondary | ICD-10-CM | POA: Diagnosis not present

## 2024-05-31 DIAGNOSIS — K582 Mixed irritable bowel syndrome: Secondary | ICD-10-CM

## 2024-05-31 NOTE — Assessment & Plan Note (Signed)
 Blood pressure is well controlled

## 2024-05-31 NOTE — Progress Notes (Signed)
 Date:  05/31/2024   Name:  Samantha Lang   DOB:  19-Sep-1941   MRN:  [id number]   Chief Complaint: Irritable Bowel Syndrome (Patient said she is diagnosed with iBS, constipation 3 - 4 days without no bowel movement)  Hypertension This is a chronic problem. The problem is controlled. Pertinent negatives include no chest pain, headaches, palpitations or shortness of breath. Past treatments include angiotensin blockers, calcium  channel blockers, beta blockers and diuretics. There is no history of kidney disease, CAD/MI or CVA.  Constipation This is a recurrent problem. Associated symptoms include diarrhea. Pertinent negatives include no abdominal pain, nausea or rectal pain.  She has IBS type symptoms and was seen by GI.  They recommended Miralax daily and IBGard prn. She has days without a bowel movement then takes Miralax for several days and has very loose watery almost uncontrollable stools.  She does not have pain, bleeding, bloating or nausea.   Review of Systems  Constitutional:  Negative for fatigue and unexpected weight change.  HENT:  Negative for trouble swallowing.   Eyes:  Negative for visual disturbance.  Respiratory:  Negative for cough, chest tightness, shortness of breath and wheezing.   Cardiovascular:  Negative for chest pain, palpitations and leg swelling.  Gastrointestinal:  Positive for diarrhea. Negative for abdominal pain, blood in stool, constipation, nausea and rectal pain.  Musculoskeletal:  Negative for arthralgias and myalgias.  Neurological:  Negative for dizziness, weakness, light-headedness and headaches.  Psychiatric/Behavioral:  Positive for dysphoric mood. The patient is nervous/anxious.      Lab Results  Component Value Date   NA 139 02/26/2024   K 3.6 02/26/2024   CO2 27 02/26/2024   GLUCOSE 151 (H) 02/26/2024   BUN 25 02/26/2024   CREATININE 0.84 02/26/2024   CALCIUM  9.2 02/26/2024   EGFR 70 02/26/2024   GFRNONAA 54 (L) 11/23/2020   Lab  Results  Component Value Date   CHOL 177 02/26/2024   HDL 65 02/26/2024   LDLCALC 74 02/26/2024   LDLDIRECT 138.3 09/26/2011   TRIG 235 (H) 02/26/2024   CHOLHDL 2.7 02/26/2024   Lab Results  Component Value Date   TSH 3.050 02/26/2024   Lab Results  Component Value Date   HGBA1C 7.6 (H) 02/26/2024   Lab Results  Component Value Date   WBC 6.2 02/26/2024   HGB 12.2 02/26/2024   HCT 35.7 02/26/2024   MCV 93 02/26/2024   PLT CANCELED 02/26/2024   Lab Results  Component Value Date   ALT 20 02/26/2024   AST 23 02/26/2024   ALKPHOS 83 02/26/2024   BILITOT 0.4 02/26/2024   No results found for: "25OHVITD2", "25OHVITD3", "VD25OH"   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   LVH (left ventricular hypertrophy) due to hypertensive disease, without heart failure 04/11/2019   Ganglion cyst of left foot 08/29/2018   Xerosis of skin 08/29/2018   History of endometrial cancer 08/03/2016   Type II diabetes mellitus with complication (HCC) 03/14/2016   GERD without esophagitis 10/02/2015   Hyperlipidemia associated with type 2 diabetes mellitus (HCC) 04/08/2015   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   Bilateral carotid artery stenosis 03/26/2015   Anxiety    Environmental and seasonal allergies  Postmenopausal atrophic vaginitis    Depression, major, recurrent, moderate (HCC)    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 Benancio Bracket, eyelid lift   POLYPECTOMY  09/22/2017   Procedure: POLYPECTOMY;  Surgeon: Marnee Sink, MD;  Location: [facility];  Service: Gastroenterology;;   TUBAL LIGATION      Social History   Tobacco Use   Smoking status: Former    Current packs/day: 0.00    Average packs/day: 0.3 packs/day for 20.0 years (5.0 ttl pk-yrs)    Types: Cigarettes    Start date: 09/25/1968    Quit date: 09/25/1988    Years since quitting: 35.7   Smokeless tobacco: Never  Vaping Use   Vaping status: Never Used  Substance Use Topics   Alcohol use: Yes    Alcohol/week: 6.0 standard drinks of alcohol    Types: 2 Glasses of wine, 2 Cans of beer, 2 Shots of liquor per week    Comment: 2 times a day. 14 a week   Drug use: Never     Medication list has been reviewed and updated.  Current Meds  Medication Sig   allopurinol  (ZYLOPRIM ) 100 MG tablet Take 1 tablet (100 mg total) by mouth daily.   amLODipine  (NORVASC ) 5 MG tablet TAKE ONE (1) TABLET BY MOUTH TWO (2) (TWO) TIMES DAILY.   aspirin 81 MG tablet Take 81 mg by mouth daily.   atorvastatin  (LIPITOR) 20 MG tablet Take 1 tablet (20 mg total) by mouth daily.   carvedilol  (COREG ) 12.5 MG tablet Take 1 tablet (12.5 mg total) by mouth 2 (two) times daily.   hydrocortisone  (ANUSOL -HC) 2.5 % rectal cream Place 1 Application rectally 2 (two) times daily.   Multiple Vitamin (MULTIVITAMIN) capsule Take 1 capsule by mouth daily.   pantoprazole  (PROTONIX ) 40 MG tablet TAKE ONE (1) TABLET BY MOUTH ONCE DAILY   sitaGLIPtin  (JANUVIA ) 100 MG tablet Take 1 tablet (100 mg total) by mouth daily.   telmisartan -hydrochlorothiazide (MICARDIS  HCT) 80-25 MG tablet Take 1 tablet by mouth daily.   triamcinolone  cream (KENALOG ) 0.1 % Apply 1 Application  topically 2 (two) times daily.   venlafaxine  XR (EFFEXOR -XR) 150 MG 24 hr capsule Take 1 capsule (150 mg total) by mouth daily with breakfast.   VITAMIN D PO Take 5,000 Units by mouth daily.       05/31/2024   10:52 AM 02/06/2024   10:32 AM 11/14/2023   10:26 AM 05/05/2023   10:56 AM  GAD 7 : Generalized Anxiety Score  Nervous, Anxious, on Edge 3 0 1 2  Control/stop worrying 2 0 1 2  Worry too much - different things 2 0 1 2  Trouble relaxing 3 0 1 0  Restless 2 0 1 0  Easily annoyed or irritable 2 0 1 1  Afraid - awful might happen 2 0 1 1  Total GAD 7 Score 16 0 7 8  Anxiety Difficulty Somewhat difficult Not difficult at all Somewhat  difficult Somewhat difficult       05/31/2024   10:52 AM 02/21/2024    3:22 PM 02/06/2024   10:32 AM  Depression screen PHQ 2/9  Decreased Interest 2 3 0  Down, Depressed, Hopeless 2 3 0  PHQ - 2 Score 4 6 0  Altered sleeping 3 0 0  Tired, decreased energy 2 0 0  Change in appetite 2 0 0  Feeling bad or failure about yourself  2 0 0  Trouble concentrating 2 0 0  Moving slowly or fidgety/restless 2 0 0  Suicidal thoughts 2 0 0  PHQ-9 Score 19 6 0  Difficult doing work/chores Somewhat difficult Not difficult at all Not difficult at all    BP Readings from Last 3 Encounters:  05/31/24 128/76  03/12/24 (!) 152/62  02/06/24 124/78    Physical Exam Vitals and nursing note reviewed.  Constitutional:      General: She is not in acute distress.    Appearance: Normal appearance. She is well-developed.  HENT:     Head: Normocephalic and atraumatic.  Cardiovascular:     Rate and Rhythm: Normal rate and regular rhythm.     Heart sounds: No murmur heard. Pulmonary:     Effort: Pulmonary effort is normal. No respiratory distress.     Breath sounds: No wheezing or rhonchi.  Abdominal:     General: There is no distension.     Palpations: Abdomen is soft. There is no mass.     Tenderness: There is no abdominal tenderness. There is no right CVA  tenderness or left CVA tenderness.  Musculoskeletal:     Right lower leg: No edema.     Left lower leg: No edema.  Skin:    General: Skin is warm and dry.     Findings: No rash.  Neurological:     Mental Status: She is alert and oriented to person, place, and time.  Psychiatric:        Mood and Affect: Mood normal.        Behavior: Behavior normal.     Wt Readings from Last 3 Encounters:  05/31/24 167 lb 4 oz (75.9 kg)  03/12/24 165 lb 8 oz (75.1 kg)  02/06/24 164 lb (74.4 kg)    BP 128/76   Pulse 71   Ht 5' 2.5" (1.588 m)   Wt 167 lb 4 oz (75.9 kg)   SpO2 97%   BMI 30.10 kg/m   Assessment and Plan:  Problem List Items Addressed This Visit       Unprioritized   Essential hypertension (Chronic)   Blood pressure is well controlled.         Irritable bowel syndrome with both constipation and diarrhea - Primary (Chronic)   Recommend she take Miralax daily but adjust the amount so that she has 1-2 soft stools per day without diarrhea. She can take IBGard prn bloating or discomfort.       No follow-ups on file.    Sheron Dixons, MD [facility] [place]

## 2024-05-31 NOTE — Assessment & Plan Note (Signed)
 Recommend she take Miralax daily but adjust the amount so that she has 1-2 soft stools per day without diarrhea. She can take IBGard prn bloating or discomfort.

## 2024-06-05 DIAGNOSIS — F3341 Major depressive disorder, recurrent, in partial remission: Secondary | ICD-10-CM | POA: Diagnosis not present

## 2024-06-05 DIAGNOSIS — F4322 Adjustment disorder with anxiety: Secondary | ICD-10-CM | POA: Diagnosis not present

## 2024-06-06 ENCOUNTER — Ambulatory Visit: Payer: Medicare HMO | Admitting: Internal Medicine

## 2024-06-06 ENCOUNTER — Encounter: Payer: Self-pay | Admitting: Internal Medicine

## 2024-06-06 NOTE — Progress Notes (Deleted)
 Date:  06/06/2024   Name:  Samantha Lang   DOB:  [birth year]   MRN:  [id number]   Chief Complaint: No chief complaint on file.  Diabetes She presents for her follow-up diabetic visit. She has type 2 diabetes mellitus. Her disease course has been stable. Pertinent negatives for hypoglycemia include no headaches or tremors. Pertinent negatives for diabetes include no chest pain, no fatigue, no polydipsia and no polyuria. Current diabetic treatments: Januvia .    Review of Systems  Constitutional:  Negative for appetite change, fatigue, fever and unexpected weight change.  HENT:  Negative for tinnitus and trouble swallowing.   Eyes:  Negative for visual disturbance.  Respiratory:  Negative for cough, chest tightness and shortness of breath.   Cardiovascular:  Negative for chest pain, palpitations and leg swelling.  Gastrointestinal:  Negative for abdominal pain.  Endocrine: Negative for polydipsia and polyuria.  Genitourinary:  Negative for dysuria and hematuria.  Musculoskeletal:  Negative for arthralgias.  Neurological:  Negative for tremors, numbness and headaches.  Psychiatric/Behavioral:  Negative for dysphoric mood.      Lab Results  Component Value Date   NA 139 02/26/2024   K 3.6 02/26/2024   CO2 27 02/26/2024   GLUCOSE 151 (H) 02/26/2024   BUN 25 02/26/2024   CREATININE 0.84 02/26/2024   CALCIUM  9.2 02/26/2024   EGFR 70 02/26/2024   GFRNONAA 54 (L) 11/23/2020   Lab Results  Component Value Date   CHOL 177 02/26/2024   HDL 65 02/26/2024   LDLCALC 74 02/26/2024   LDLDIRECT 138.3 09/26/2011   TRIG 235 (H) 02/26/2024   CHOLHDL 2.7 02/26/2024   Lab Results  Component Value Date   TSH 3.050 02/26/2024   Lab Results  Component Value Date   HGBA1C 7.6 (H) 02/26/2024   Lab Results  Component Value Date   WBC 6.2 02/26/2024   HGB 12.2 02/26/2024   HCT 35.7 02/26/2024   MCV 93 02/26/2024   PLT CANCELED 02/26/2024   Lab Results  Component Value Date   ALT  20 02/26/2024   AST 23 02/26/2024   ALKPHOS 83 02/26/2024   BILITOT 0.4 02/26/2024   No results found for: Lucetta Russel, VD25OH   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   LVH (left ventricular hypertrophy) due to hypertensive disease, without heart failure 04/11/2019   Ganglion cyst of left foot 08/29/2018   Xerosis of skin 08/29/2018   History of endometrial cancer 08/03/2016   Type II diabetes mellitus with complication (HCC) 03/14/2016   GERD without esophagitis 10/02/2015   Hyperlipidemia associated with type 2 diabetes mellitus (HCC) 04/08/2015   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   Bilateral carotid artery stenosis 03/26/2015   Anxiety    Environmental and seasonal allergies    Postmenopausal atrophic vaginitis    Depression, major, recurrent, moderate (HCC)    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 Benancio Bracket, eyelid lift   POLYPECTOMY  09/22/2017   Procedure: POLYPECTOMY;  Surgeon: Marnee Sink, MD;  Location: [facility];  Service: Gastroenterology;;   TUBAL LIGATION      Social History   Tobacco Use   Smoking status: Former    Current packs/day: 0.00    Average packs/day:  0.3 packs/day for 20.0 years (5.0 ttl pk-yrs)    Types: Cigarettes    Start date: 09/25/1968    Quit date: 09/25/1988    Years since quitting: 35.7   Smokeless tobacco: Never  Vaping Use   Vaping status: Never Used  Substance Use Topics   Alcohol use: Yes    Alcohol/week: 6.0 standard drinks of alcohol    Types: 2 Glasses of wine, 2 Cans of beer, 2 Shots of liquor per week    Comment: 2 times a day. 14 a week   Drug use: Never     Medication list has been reviewed and updated.  No outpatient medications have been marked as taking for the 06/06/24 encounter (Appointment) with Sheron Dixons, MD.       05/31/2024   10:52 AM 02/06/2024   10:32 AM 11/14/2023   10:26 AM 05/05/2023   10:56 AM  GAD 7 : Generalized Anxiety Score  Nervous, Anxious, on Edge 3 0 1 2  Control/stop worrying 2 0 1 2  Worry too much - different things 2 0 1 2  Trouble relaxing 3 0 1 0  Restless 2 0 1 0  Easily annoyed or irritable 2 0 1 1  Afraid - awful might happen 2 0 1 1  Total GAD 7 Score 16 0 7 8  Anxiety Difficulty Somewhat difficult Not difficult at all Somewhat difficult Somewhat difficult       05/31/2024   10:52 AM 02/21/2024    3:22 PM 02/06/2024   10:32 AM  Depression screen PHQ 2/9  Decreased Interest 2 3 0  Down, Depressed, Hopeless 2 3 0  PHQ - 2 Score 4 6 0  Altered sleeping 3 0 0  Tired, decreased energy 2 0 0  Change in appetite 2 0 0  Feeling bad or failure about yourself  2 0 0  Trouble concentrating 2 0 0  Moving slowly or fidgety/restless 2 0 0  Suicidal thoughts 2 0 0  PHQ-9 Score 19 6 0  Difficult doing work/chores Somewhat difficult Not difficult at all Not difficult at all    BP Readings from Last 3 Encounters:  05/31/24 128/76  03/12/24 (!) 152/62  02/06/24 124/78    Physical Exam Vitals and nursing note reviewed.  Constitutional:      General: She is not in acute distress.    Appearance: She is well-developed.  HENT:     Head: Normocephalic and atraumatic.   Pulmonary:     Effort: Pulmonary effort is normal. No respiratory distress.   Skin:    General: Skin is warm and dry.     Findings: No rash.   Neurological:     Mental Status: She is alert and oriented to person, place, and time.   Psychiatric:        Mood and Affect: Mood normal.        Behavior: Behavior normal.     Wt Readings  from Last 3 Encounters:  05/31/24 167 lb 4 oz (75.9 kg)  03/12/24 165 lb 8 oz (75.1 kg)  02/06/24 164 lb (74.4 kg)    There were no vitals taken for this visit.  Assessment and Plan:  Problem List Items Addressed This Visit   None   No follow-ups on file.    Sheron Dixons, MD [facility] [place]

## 2024-06-18 DIAGNOSIS — F411 Generalized anxiety disorder: Secondary | ICD-10-CM | POA: Diagnosis not present

## 2024-06-18 DIAGNOSIS — F331 Major depressive disorder, recurrent, moderate: Secondary | ICD-10-CM | POA: Diagnosis not present

## 2024-06-18 DIAGNOSIS — F1090 Alcohol use, unspecified, uncomplicated: Secondary | ICD-10-CM | POA: Diagnosis not present

## 2024-06-24 ENCOUNTER — Other Ambulatory Visit: Payer: Self-pay | Admitting: Internal Medicine

## 2024-06-24 DIAGNOSIS — E1169 Type 2 diabetes mellitus with other specified complication: Secondary | ICD-10-CM

## 2024-06-24 DIAGNOSIS — K219 Gastro-esophageal reflux disease without esophagitis: Secondary | ICD-10-CM

## 2024-06-24 DIAGNOSIS — E118 Type 2 diabetes mellitus with unspecified complications: Secondary | ICD-10-CM

## 2024-06-24 DIAGNOSIS — I10 Essential (primary) hypertension: Secondary | ICD-10-CM

## 2024-06-24 DIAGNOSIS — M10079 Idiopathic gout, unspecified ankle and foot: Secondary | ICD-10-CM

## 2024-06-26 DIAGNOSIS — F4322 Adjustment disorder with anxiety: Secondary | ICD-10-CM | POA: Diagnosis not present

## 2024-06-26 DIAGNOSIS — F3341 Major depressive disorder, recurrent, in partial remission: Secondary | ICD-10-CM | POA: Diagnosis not present

## 2024-06-26 NOTE — Telephone Encounter (Signed)
 Requested medication (s) are due for refill today:   Yes for some not for others   7 total  Requested medication (s) are on the active medication list:   Yes for all 7  Future visit scheduled:   Yes   AWV 02/26/2025   LOV 05/31/2024   Was for follow up on 06/06/2024 but was a No Show.   Last ordered: 7 medications with various times but mostly 02/06/2024 with 1 refill for most.  Unable to refill because pt was a No Show for 06/06/2024 appt.      Requested Prescriptions  Pending Prescriptions Disp Refills   JANUVIA  100 MG tablet [Pharmacy Med Name: JANUVIA  100MG  TABLET] 90 tablet 2    Sig: TAKE ONE (1) TABLET (100 MG TOTAL) BY MOUTH DAILY.     Endocrinology:  Diabetes - DPP-4 Inhibitors Passed - 06/26/2024  1:11 PM      Passed - HBA1C is between 0 and 7.9 and within 180 days    Hgb A1c MFr Bld  Date Value Ref Range Status  02/26/2024 7.6 (H) 4.8 - 5.6 % Final    Comment:             Prediabetes: 5.7 - 6.4          Diabetes: >6.4          Glycemic control for adults with diabetes: <7.0          Passed - Cr in normal range and within 360 days    Creatinine, Ser  Date Value Ref Range Status  02/26/2024 0.84 0.57 - 1.00 mg/dL Final         Passed - Valid encounter within last 6 months    Recent Outpatient Visits           3 weeks ago Irritable bowel syndrome with both constipation and diarrhea   [place] [facility] at [facility], Leita DEL, MD   4 months ago Annual physical exam   [facility] at [facility], Leita DEL, MD               amLODipine  (NORVASC ) 5 MG tablet [Pharmacy Med Name: AMLODIPINE  BESYLATE 5MG  TABLET] 180 tablet 2    Sig: TAKE ONE (1) TABLET BY MOUTH TWO (2) TIMES DAILY.     Cardiovascular: Calcium  Channel Blockers 2 Passed - 06/26/2024  1:11 PM      Passed - Last BP in normal range    BP Readings from Last 1 Encounters:  05/31/24 128/76         Passed - Last Heart Rate in  normal range    Pulse Readings from Last 1 Encounters:  05/31/24 71         Passed - Valid encounter within last 6 months    Recent Outpatient Visits           3 weeks ago Irritable bowel syndrome with both constipation and diarrhea   [place] [facility] at [facility], Leita DEL, MD   4 months ago Annual physical exam   [facility] at [facility], Leita DEL, MD               pantoprazole  (PROTONIX ) 40 MG tablet [Pharmacy Med Name: PANTOPRAZOLE  SODIUM 40MG  TABLET DR] 90 tablet 2    Sig: TAKE ONE (1) TABLET BY MOUTH ONCE DAILY     Gastroenterology: Proton  Pump Inhibitors Passed - 06/26/2024  1:11 PM      Passed - Valid encounter within last 12 months    Recent Outpatient Visits           3 weeks ago Irritable bowel syndrome with both constipation and diarrhea   [place] [facility] at [facility], Leita DEL, MD   4 months ago Annual physical exam   [facility] at [facility], Leita DEL, MD               carvedilol  (COREG ) 12.5 MG tablet [Pharmacy Med Name: CARVEDILOL  12.5MG  TABLET] 180 tablet 2    Sig: TAKE ONE (1) TABLET (12.5 MG TOTAL) BY MOUTH TWO (2) (TWO) TIMES DAILY.     Cardiovascular: Beta Blockers 3 Passed - 06/26/2024  1:11 PM      Passed - Cr in normal range and within 360 days    Creatinine, Ser  Date Value Ref Range Status  02/26/2024 0.84 0.57 - 1.00 mg/dL Final         Passed - AST in normal range and within 360 days    AST  Date Value Ref Range Status  02/26/2024 23 0 - 40 IU/L Final         Passed - ALT in normal range and within 360 days    ALT  Date Value Ref Range Status  02/26/2024 20 0 - 32 IU/L Final         Passed - Last BP in normal range    BP Readings from Last 1 Encounters:  05/31/24 128/76         Passed - Last Heart Rate in normal range    Pulse Readings from Last 1  Encounters:  05/31/24 71         Passed - Valid encounter within last 6 months    Recent Outpatient Visits           3 weeks ago Irritable bowel syndrome with both constipation and diarrhea   [place] [facility] at [facility], Leita DEL, MD   4 months ago Annual physical exam   [facility] at [facility], Leita DEL, MD               allopurinol  (ZYLOPRIM ) 100 MG tablet [Pharmacy Med Name: ALLOPURINOL  100MG  TABLET] 90 tablet 2    Sig: TAKE ONE (1) TABLET (100 MG TOTAL) BY MOUTH DAILY.     Endocrinology:  Gout Agents - allopurinol  Passed - 06/26/2024  1:11 PM      Passed - Uric Acid in normal range and within 360 days    Uric Acid  Date Value Ref Range Status  02/26/2024 7.0 3.1 - 7.9 mg/dL Final    Comment:               Therapeutic target for gout patients: <6.0         Passed - Cr in normal range and within 360 days    Creatinine, Ser  Date Value Ref Range Status  02/26/2024 0.84 0.57 - 1.00 mg/dL Final         Passed - Valid encounter within last 12 months    Recent Outpatient Visits           3 weeks ago Irritable bowel syndrome with both constipation and diarrhea   [place] [facility] at  [facility] Leita VEAR, MD   4 months ago Annual physical exam   [facility] at [facility], Leita VEAR, MD              Passed - CBC within normal limits and completed in the last 12 months    WBC  Date Value Ref Range Status  02/26/2024 6.2 3.4 - 10.8 x10E3/uL Final   RBC  Date Value Ref Range Status  02/26/2024 3.85 3.77 - 5.28 x10E6/uL Final    Comment:    Polychromasia present   Hemoglobin  Date Value Ref Range Status  02/26/2024 12.2 11.1 - 15.9 g/dL Final   Hematocrit  Date Value Ref Range Status  02/26/2024 35.7 34.0 - 46.6 % Final   MCHC  Date Value Ref Range Status  02/26/2024 34.2 31.5 - 35.7  g/dL Final   [facility]  Date Value Ref Range Status  02/26/2024 31.7 26.6 - 33.0 pg Final   MCV  Date Value Ref Range Status  02/26/2024 93 79 - 97 fL Final   No results found for: PLTCOUNTKUC, LABPLAT, POCPLA RDW  Date Value Ref Range Status  02/26/2024 13.0 11.7 - 15.4 % Final          telmisartan -hydrochlorothiazide (MICARDIS  HCT) 80-25 MG tablet [Pharmacy Med Name: TELMISARTAN /HYDROCHLOROTHIAZIDE 80-25MG  TABLET] 90 tablet 2    Sig: TAKE ONE (1) TABLET BY MOUTH DAILY.     Cardiovascular: ARB + Diuretic Combos Passed - 06/26/2024  1:11 PM      Passed - K in normal range and within 180 days    Potassium  Date Value Ref Range Status  02/26/2024 3.6 3.5 - 5.2 mmol/L Final         Passed - Na in normal range and within 180 days    Sodium  Date Value Ref Range Status  02/26/2024 139 134 - 144 mmol/L Final         Passed - Cr in normal range and within 180 days    Creatinine, Ser  Date Value Ref Range Status  02/26/2024 0.84 0.57 - 1.00 mg/dL Final         Passed - eGFR is 10 or above and within 180 days    GFR calc Af Amer  Date Value Ref Range Status  11/23/2020 62 >59 mL/min/1.73 Final    Comment:    **In accordance with recommendations from the NKF-ASN Task force,**   [facility] is in the process of updating its eGFR calculation to the   2021 CKD-EPI creatinine equation that estimates kidney function   without a race variable.    GFR calc non Af Amer  Date Value Ref Range Status  11/23/2020 54 (L) >59 mL/min/1.73 Final   GFR  Date Value Ref Range Status  09/26/2011 81.47 >60.00 mL/min Final   eGFR  Date Value Ref Range Status  02/26/2024 70 >59 mL/min/1.73 Final         Passed - Patient is not pregnant      Passed - Last BP in normal range    BP Readings from Last 1 Encounters:  05/31/24 128/76         Passed - Valid encounter within last 6 months    Recent Outpatient Visits           3 weeks ago Irritable bowel syndrome with both constipation  and diarrhea   [facility] at [facility], Leita VEAR, MD  4 months ago Annual physical exam   [facility] at [facility], Leita DEL, MD               atorvastatin  (LIPITOR) 20 MG tablet [Pharmacy Med Name: ATORVASTATIN  CALCIUM  20MG  TABLET] 90 tablet 2    Sig: TAKE ONE (1) TABLET (20 MG TOTAL) BY MOUTH DAILY.     Cardiovascular:  Antilipid - Statins Failed - 06/26/2024  1:11 PM      Failed - Lipid Panel in normal range within the last 12 months    Cholesterol, Total  Date Value Ref Range Status  02/26/2024 177 100 - 199 mg/dL Final   LDL Chol Calc (NIH)  Date Value Ref Range Status  02/26/2024 74 0 - 99 mg/dL Final   Direct LDL  Date Value Ref Range Status  09/26/2011 138.3 mg/dL Final    Comment:    Optimal:  <100 mg/dLNear or Above Optimal:  100-129 mg/dLBorderline High:  130-159 mg/dLHigh:  160-189 mg/dLVery High:  >190 mg/dL   HDL  Date Value Ref Range Status  02/26/2024 65 >39 mg/dL Final   Triglycerides  Date Value Ref Range Status  02/26/2024 235 (H) 0 - 149 mg/dL Final         Passed - Patient is not pregnant      Passed - Valid encounter within last 12 months    Recent Outpatient Visits           3 weeks ago Irritable bowel syndrome with both constipation and diarrhea   [place] [facility] at [facility], Leita DEL, MD   4 months ago Annual physical exam   [facility] Medicine at [facility], Leita DEL, MD

## 2024-07-03 DIAGNOSIS — F411 Generalized anxiety disorder: Secondary | ICD-10-CM | POA: Diagnosis not present

## 2024-07-03 DIAGNOSIS — F1090 Alcohol use, unspecified, uncomplicated: Secondary | ICD-10-CM | POA: Diagnosis not present

## 2024-07-03 DIAGNOSIS — F331 Major depressive disorder, recurrent, moderate: Secondary | ICD-10-CM | POA: Diagnosis not present

## 2024-07-12 DIAGNOSIS — F4322 Adjustment disorder with anxiety: Secondary | ICD-10-CM | POA: Diagnosis not present

## 2024-07-12 DIAGNOSIS — F3341 Major depressive disorder, recurrent, in partial remission: Secondary | ICD-10-CM | POA: Diagnosis not present

## 2024-07-25 DIAGNOSIS — F411 Generalized anxiety disorder: Secondary | ICD-10-CM | POA: Diagnosis not present

## 2024-07-25 DIAGNOSIS — F1090 Alcohol use, unspecified, uncomplicated: Secondary | ICD-10-CM | POA: Diagnosis not present

## 2024-07-25 DIAGNOSIS — F331 Major depressive disorder, recurrent, moderate: Secondary | ICD-10-CM | POA: Diagnosis not present

## 2024-07-26 DIAGNOSIS — F3341 Major depressive disorder, recurrent, in partial remission: Secondary | ICD-10-CM | POA: Diagnosis not present

## 2024-07-26 DIAGNOSIS — F4322 Adjustment disorder with anxiety: Secondary | ICD-10-CM | POA: Diagnosis not present

## 2024-08-02 ENCOUNTER — Telehealth: Payer: Self-pay

## 2024-08-02 NOTE — Telephone Encounter (Signed)
 Copied from CRM [id number]. Topic: Clinical - Medical Advice >> Aug 02, 2024 11:11 AM Harlene ORN wrote: Reason for CRM: Patient called to get scheduled with Dr. Alvia in Sports Medicine. Please advise.

## 2024-08-07 DIAGNOSIS — F4322 Adjustment disorder with anxiety: Secondary | ICD-10-CM | POA: Diagnosis not present

## 2024-08-07 DIAGNOSIS — F3341 Major depressive disorder, recurrent, in partial remission: Secondary | ICD-10-CM | POA: Diagnosis not present

## 2024-08-13 ENCOUNTER — Encounter: Payer: Self-pay | Admitting: Family Medicine

## 2024-08-13 ENCOUNTER — Ambulatory Visit (INDEPENDENT_AMBULATORY_CARE_PROVIDER_SITE_OTHER): Admitting: Family Medicine

## 2024-08-13 ENCOUNTER — Other Ambulatory Visit: Payer: Self-pay | Admitting: Internal Medicine

## 2024-08-13 ENCOUNTER — Ambulatory Visit
Admission: RE | Admit: 2024-08-13 | Discharge: 2024-08-13 | Disposition: A | Discharge status: Home / Self Care | Attending: Family Medicine | Admitting: Family Medicine

## 2024-08-13 ENCOUNTER — Ambulatory Visit
Admission: RE | Admit: 2024-08-13 | Discharge: 2024-08-13 | Disposition: A | Discharge status: Home / Self Care | Source: Ambulatory Visit | Attending: Family Medicine | Admitting: Family Medicine

## 2024-08-13 VITALS — BP 112/60 | HR 65 | Temp 97.4°F | Resp 18 | Ht 62.5 in | Wt 168.0 lb

## 2024-08-13 DIAGNOSIS — M85842 Other specified disorders of bone density and structure, left hand: Secondary | ICD-10-CM | POA: Diagnosis not present

## 2024-08-13 DIAGNOSIS — I10 Essential (primary) hypertension: Secondary | ICD-10-CM

## 2024-08-13 DIAGNOSIS — M79642 Pain in left hand: Secondary | ICD-10-CM | POA: Diagnosis not present

## 2024-08-13 DIAGNOSIS — M10079 Idiopathic gout, unspecified ankle and foot: Secondary | ICD-10-CM

## 2024-08-13 DIAGNOSIS — M25812 Other specified joint disorders, left shoulder: Secondary | ICD-10-CM | POA: Insufficient documentation

## 2024-08-13 DIAGNOSIS — M19042 Primary osteoarthritis, left hand: Secondary | ICD-10-CM | POA: Diagnosis not present

## 2024-08-13 DIAGNOSIS — E1169 Type 2 diabetes mellitus with other specified complication: Secondary | ICD-10-CM

## 2024-08-13 DIAGNOSIS — M79641 Pain in right hand: Secondary | ICD-10-CM

## 2024-08-13 DIAGNOSIS — M19041 Primary osteoarthritis, right hand: Secondary | ICD-10-CM | POA: Diagnosis not present

## 2024-08-13 DIAGNOSIS — M85841 Other specified disorders of bone density and structure, right hand: Secondary | ICD-10-CM | POA: Diagnosis not present

## 2024-08-13 MED ORDER — MELOXICAM 7.5 MG PO TABS
7.5000 mg | ORAL_TABLET | Freq: Every day | ORAL | 0 refills | Status: DC
Start: 2024-08-13 — End: 2024-11-09

## 2024-08-13 NOTE — Assessment & Plan Note (Signed)
 Bilateral hand and finger pain and swelling - Recent onset of swelling and change in angle of the right fingertips, with associated pain - Similar symptoms present in the left hand, but right hand is more affected due to right-handedness - Pain and swelling have been present for several months - Pain impairs ability to perform tasks such as screwing and unscrewing objects, requiring assistance from her husband - Voltaren  gel used as needed with minimal relief - No prior x-rays of the fingers - Concern for possible systemic or autoimmune etiology - History of gout, with concern for possible relation to current symptoms  Physical Exam BILATERAL HANDS INSPECTION: Prominence of DIP joints bilaterally, more pronounced at the right third digit; no ecchymosis or erythema PALPATION: Right third DIP with palpable tenderness, greatest at the ulnar aspect; radial aspect also tender; left DIP non-tender; PIP and MCP joints of the third digits bilaterally nontender RANGE OF MOTION: Flexion and extension intact bilaterally at DIP, PIP, and MCP joints STRENGTH: Right hand 5/5 strength with flexion and extension, painless; left hand 4+/5 strength with DIP flexion, otherwise 5/5 NEUROLOGICAL: Sensation grossly intact, no motor deficits observed SPECIAL TESTS: Varus stress at DIP joints negative for laxity or discomfort bilaterally  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 Differential includes other arthritic conditions, but rheumatoid arthritis unlikely. - Ordered x-rays of bilateral hands. - Prescribed meloxicam  once daily for one week, then as needed, take with food. - Instructed to call if no improvement by Friday for consideration of oral steroids. - Provided hand exercises for joint mobility.

## 2024-08-13 NOTE — Assessment & Plan Note (Signed)
 Left arm myalgia - Pain localized to the muscle of the left arm, particularly with lifting or supporting weight - Pain occurs when lifting her dog by the butt or when the dog sits on her arm - Pain is activity-related and localized to the muscle  LEFT SHOULDER INSPECTION: No deformity, swelling, or atrophy PALPATION: Nontender at subacromial space, bicipital groove, and trapezius RANGE OF MOTION: Full, symmetric motion; abduction elicits pain STRENGTH: 5/5 with external and internal rotation, painless; supraspinatus testing 5/5 with pain elicited NEUROLOGICAL: Sensation intact in left upper extremity, no motor deficits SPECIAL TESTS: Neer's test positive; Hawkins test positive  Left shoulder impingement syndrome (rotator cuff tendinitis) Pain due to overuse with positive impingement tests. Preserved strength reassuring. - Prescribed meloxicam  once daily for one week, then as needed. - Advised limiting arm movements to a microwave-sized box. - Consider corticosteroid injection if oral medication is ineffective.

## 2024-08-13 NOTE — Progress Notes (Signed)
 Primary Care / Sports Medicine Office Visit  Patient Information:  Patient ID: Samantha Lang, female DOB: May 05, 1941 Age: 83 y.o. MRN: [id number]   Samantha Lang is a pleasant 84 y.o. female presenting with the following:  Chief Complaint  Patient presents with   Arm Pain    Left arm pain x 2 months. Lifting is difficult. Bil long fingers swelling and painful last few months.     Vitals:   08/13/24 1106  BP: 112/60  Pulse: 65  SpO2: 98%   Vitals:   08/13/24 1106  Weight: 168 lb (76.2 kg)  Height: 5' 2.5 (1.588 m)   Body mass index is 30.24 kg/m.  No results found.   Independent interpretation of notes and tests performed by another provider:   None  Procedures performed:   None  Pertinent History, Exam, Impression, and Recommendations:   Problem List Items Addressed This Visit     Bilateral hand pain - Primary   Bilateral hand and finger pain and swelling - Recent onset of swelling and change in angle of the right fingertips, with associated pain - Similar symptoms present in the left hand, but right hand is more affected due to right-handedness - Pain and swelling have been present for several months - Pain impairs ability to perform tasks such as screwing and unscrewing objects, requiring assistance from her husband - Voltaren  gel used as needed with minimal relief - No prior x-rays of the fingers - Concern for possible systemic or autoimmune etiology - History of gout, with concern for possible relation to current symptoms  Physical Exam BILATERAL HANDS INSPECTION: Prominence of DIP joints bilaterally, more pronounced at the right third digit; no ecchymosis or erythema PALPATION: Right third DIP with palpable tenderness, greatest at the ulnar aspect; radial aspect also tender; left DIP non-tender; PIP and MCP joints of the third digits bilaterally nontender RANGE OF MOTION: Flexion and extension intact bilaterally at DIP, PIP, and MCP  joints STRENGTH: Right hand 5/5 strength with flexion and extension, painless; left hand 4+/5 strength with DIP flexion, otherwise 5/5 NEUROLOGICAL: Sensation grossly intact, no motor deficits observed SPECIAL TESTS: Varus stress at DIP joints negative for laxity or discomfort bilaterally  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 Differential includes other arthritic conditions, but rheumatoid arthritis unlikely. - Ordered x-rays of bilateral hands. - Prescribed meloxicam  once daily for one week, then as needed, take with food. - Instructed to call if no improvement by Friday for consideration of oral steroids. - Provided hand exercises for joint mobility.      Relevant Orders   DG Hand Complete Right   DG Hand Complete Left   Shoulder impingement, left   Left arm myalgia - Pain localized to the muscle of the left arm, particularly with lifting or supporting weight - Pain occurs when lifting her dog by the butt or when the dog sits on her arm - Pain is activity-related and localized to the muscle  LEFT SHOULDER INSPECTION: No deformity, swelling, or atrophy PALPATION: Nontender at subacromial space, bicipital groove, and trapezius RANGE OF MOTION: Full, symmetric motion; abduction elicits pain STRENGTH: 5/5 with external and internal rotation, painless; supraspinatus testing 5/5 with pain elicited NEUROLOGICAL: Sensation intact in left upper extremity, no motor deficits SPECIAL TESTS: Neer's test positive; Hawkins test positive  Left shoulder impingement syndrome (rotator cuff tendinitis) Pain due to overuse with positive impingement tests. Preserved strength reassuring. -  Prescribed meloxicam  once daily for one week, then as needed. - Advised limiting arm movements to a microwave-sized box. - Consider corticosteroid injection if oral medication is ineffective.        Orders &  Medications Medications:  Meds ordered this encounter  Medications   meloxicam  (MOBIC ) 7.5 MG tablet    Sig: Take 1 tablet (7.5 mg total) by mouth daily. X 1 week then daily PRN    Dispense:  30 tablet    Refill:  0   Orders Placed This Encounter  Procedures   DG Hand Complete Right   DG Hand Complete Left     No follow-ups on file.     Samantha JINNY Ku, MD, [facility]   Primary Care Sports Medicine Primary Care and Sports Medicine at [facility]

## 2024-08-13 NOTE — Patient Instructions (Signed)
 Patient Plan for Post-Visit Guidance  Bilateral Hand and Finger Pain - X-rays of both hands have been ordered. - Take meloxicam  once daily with food for one week, then as needed. - Perform hand exercises provided for joint mobility. - Call the office if there is no improvement by Friday to discuss possible oral steroids.  Left Shoulder Pain (Impingement Syndrome) - Take meloxicam  once daily with food for one week, then as needed. - Limit arm movements to a microwave-sized box to avoid overuse. - If pain does not improve with medication, consider a corticosteroid injection.  Red Flags - If you experience severe pain, sudden swelling, redness, warmth, fever, loss of movement, or numbness in your hands or shoulder, seek medical attention promptly.

## 2024-08-14 NOTE — Telephone Encounter (Signed)
 Requested Prescriptions  Pending Prescriptions Disp Refills   atorvastatin  (LIPITOR) 20 MG tablet [Pharmacy Med Name: ATORVASTATIN  CALCIUM  20MG  TABLET] 90 tablet 0    Sig: TAKE (1) TABLET BY MOUTH EVERY DAY     Cardiovascular:  Antilipid - Statins Failed - 08/14/2024  4:24 PM      Failed - Lipid Panel in normal range within the last 12 months    Cholesterol, Total  Date Value Ref Range Status  02/26/2024 177 100 - 199 mg/dL Final   LDL Chol Calc (NIH)  Date Value Ref Range Status  02/26/2024 74 0 - 99 mg/dL Final   Direct LDL  Date Value Ref Range Status  09/26/2011 138.3 mg/dL Final    Comment:    Optimal:  <100 mg/dLNear or Above Optimal:  100-129 mg/dLBorderline High:  130-159 mg/dLHigh:  160-189 mg/dLVery High:  >190 mg/dL   HDL  Date Value Ref Range Status  02/26/2024 65 >39 mg/dL Final   Triglycerides  Date Value Ref Range Status  02/26/2024 235 (H) 0 - 149 mg/dL Final         Passed - Patient is not pregnant      Passed - Valid encounter within last 12 months    Recent Outpatient Visits           Yesterday Bilateral hand pain   [place] [facility], Selinda PARAS, MD   2 months ago Irritable bowel syndrome with both constipation and diarrhea   [place] [facility] at [facility], Leita DEL, MD   6 months ago Annual physical exam   [facility] at [facility], Leita DEL, MD               telmisartan -hydrochlorothiazide (MICARDIS  HCT) 80-25 MG tablet [Pharmacy Med Name: TELMISARTAN /HYDROCHLOROTHIAZIDE 80-25MG  TABLET] 90 tablet 0    Sig: TAKE (1) TABLET BY MOUTH EVERY DAY     Cardiovascular: ARB + Diuretic Combos Passed - 08/14/2024  4:24 PM      Passed - K in normal range and within 180 days    Potassium  Date Value Ref Range Status  02/26/2024 3.6 3.5 - 5.2 mmol/L Final         Passed - Na in normal range and within 180 days     Sodium  Date Value Ref Range Status  02/26/2024 139 134 - 144 mmol/L Final         Passed - Cr in normal range and within 180 days    Creatinine, Ser  Date Value Ref Range Status  02/26/2024 0.84 0.57 - 1.00 mg/dL Final         Passed - eGFR is 10 or above and within 180 days    GFR calc Af Amer  Date Value Ref Range Status  11/23/2020 62 >59 mL/min/1.73 Final    Comment:    **In accordance with recommendations from the NKF-ASN Task force,**   [facility] is in the process of updating its eGFR calculation to the   2021 CKD-EPI creatinine equation that estimates kidney function   without a race variable.    GFR calc non Af Amer  Date Value Ref Range Status  11/23/2020 54 (L) >59 mL/min/1.73 Final   GFR  Date Value Ref Range Status  09/26/2011 81.47 >60.00 mL/min Final   eGFR  Date Value Ref Range Status  02/26/2024 70 >59 mL/min/1.73 Final  Passed - Patient is not pregnant      Passed - Last BP in normal range    BP Readings from Last 1 Encounters:  08/13/24 112/60         Passed - Valid encounter within last 6 months    Recent Outpatient Visits           Yesterday Bilateral hand pain   [place] [facility], Selinda PARAS, MD   2 months ago Irritable bowel syndrome with both constipation and diarrhea   [place] [facility], Leita DEL, MD   6 months ago Annual physical exam   [facility] at [facility], Leita DEL, MD               allopurinol  (ZYLOPRIM ) 100 MG tablet [Pharmacy Med Name: ALLOPURINOL  100MG  TABLET] 90 tablet 0    Sig: TAKE (1) TABLET BY MOUTH EVERY DAY     Endocrinology:  Gout Agents - allopurinol  Passed - 08/14/2024  4:24 PM      Passed - Uric Acid in normal range and within 360 days    Uric Acid  Date Value Ref Range Status  02/26/2024 7.0 3.1 - 7.9 mg/dL Final    Comment:               Therapeutic  target for gout patients: <6.0         Passed - Cr in normal range and within 360 days    Creatinine, Ser  Date Value Ref Range Status  02/26/2024 0.84 0.57 - 1.00 mg/dL Final         Passed - Valid encounter within last 12 months    Recent Outpatient Visits           Yesterday Bilateral hand pain   [place] [facility] at [facility], Selinda PARAS, MD   2 months ago Irritable bowel syndrome with both constipation and diarrhea   [place] [facility] at [facility], Leita DEL, MD   6 months ago Annual physical exam   [facility] at [facility], Leita DEL, MD              Passed - CBC within normal limits and completed in the last 12 months    WBC  Date Value Ref Range Status  02/26/2024 6.2 3.4 - 10.8 x10E3/uL Final   RBC  Date Value Ref Range Status  02/26/2024 3.85 3.77 - 5.28 x10E6/uL Final    Comment:    Polychromasia present   Hemoglobin  Date Value Ref Range Status  02/26/2024 12.2 11.1 - 15.9 g/dL Final   Hematocrit  Date Value Ref Range Status  02/26/2024 35.7 34.0 - 46.6 % Final   MCHC  Date Value Ref Range Status  02/26/2024 34.2 31.5 - 35.7 g/dL Final   [facility]  Date Value Ref Range Status  02/26/2024 31.7 26.6 - 33.0 pg Final   MCV  Date Value Ref Range Status  02/26/2024 93 79 - 97 fL Final   No results found for: PLTCOUNTKUC, LABPLAT, POCPLA RDW  Date Value Ref Range Status  02/26/2024 13.0 11.7 - 15.4 % Final          carvedilol  (COREG ) 12.5 MG tablet [Pharmacy Med Name: CARVEDILOL  12.5MG  TABLET] 180 tablet 0    Sig: TAKE (1)  TABLET BY MOUTH TWICE DAILY     Cardiovascular: Beta Blockers 3 Passed - 08/14/2024  4:24 PM      Passed - Cr in normal range and within 360 days    Creatinine, Ser  Date Value Ref Range Status  02/26/2024 0.84 0.57 - 1.00 mg/dL Final         Passed - AST in normal range and within 360 days    AST   Date Value Ref Range Status  02/26/2024 23 0 - 40 IU/L Final         Passed - ALT in normal range and within 360 days    ALT  Date Value Ref Range Status  02/26/2024 20 0 - 32 IU/L Final         Passed - Last BP in normal range    BP Readings from Last 1 Encounters:  08/13/24 112/60         Passed - Last Heart Rate in normal range    Pulse Readings from Last 1 Encounters:  08/13/24 65         Passed - Valid encounter within last 6 months    Recent Outpatient Visits           Yesterday Bilateral hand pain   [place] [facility], Selinda PARAS, MD   2 months ago Irritable bowel syndrome with both constipation and diarrhea   [place] [facility] at [facility], Leita DEL, MD   6 months ago Annual physical exam   [facility] at [facility], Leita DEL, MD

## 2024-08-19 DIAGNOSIS — F4322 Adjustment disorder with anxiety: Secondary | ICD-10-CM | POA: Diagnosis not present

## 2024-08-19 DIAGNOSIS — F3341 Major depressive disorder, recurrent, in partial remission: Secondary | ICD-10-CM | POA: Diagnosis not present

## 2024-08-28 ENCOUNTER — Ambulatory Visit: Payer: Self-pay | Admitting: Family Medicine

## 2024-08-28 DIAGNOSIS — Z008 Encounter for other general examination: Secondary | ICD-10-CM | POA: Diagnosis not present

## 2024-09-04 DIAGNOSIS — F3341 Major depressive disorder, recurrent, in partial remission: Secondary | ICD-10-CM | POA: Diagnosis not present

## 2024-09-04 DIAGNOSIS — F4322 Adjustment disorder with anxiety: Secondary | ICD-10-CM | POA: Diagnosis not present

## 2024-09-18 ENCOUNTER — Encounter: Payer: Self-pay | Admitting: Internal Medicine

## 2024-09-18 NOTE — Telephone Encounter (Signed)
 FYI  KP

## 2024-09-23 ENCOUNTER — Telehealth: Payer: Self-pay | Admitting: Internal Medicine

## 2024-09-23 ENCOUNTER — Ambulatory Visit: Payer: Self-pay

## 2024-09-23 DIAGNOSIS — F4322 Adjustment disorder with anxiety: Secondary | ICD-10-CM | POA: Diagnosis not present

## 2024-09-23 NOTE — Telephone Encounter (Signed)
 FYI Only or Action Required?: FYI only for provider.  Patient was last seen in primary care on 08/13/2024 by Alvia Selinda PARAS, MD.  Called Nurse Triage reporting Dizziness.  Symptoms began several days ago.  Interventions attempted: Rest, hydration, or home remedies.  Symptoms are: unchanged.  Triage Disposition: See Physician Within 24 Hours  Patient/caregiver understands and will follow disposition?: Yes          Copied from CRM #[id number]. Topic: Clinical - Refused Triage >> Sep 23, 2024 11:59 AM Myrick T wrote: Patient/caller voiced complaints of being out of town the weekend and Friday when she was coming off the train she stood up and flopped back down on a the as she feels her knee just gave out. She said the same day she bent over and became very dizzy thought she would just tumble all the way over. She felt unbalanced all weekend and wants to know what she needs to do   Declined transfer to triage as she was waiting on a call from another doctors office. Patient said she would call back.          Reason for Disposition  [1] MODERATE dizziness (e.g., interferes with normal activities) AND [2] has NOT been evaluated by doctor (or NP/PA) for this  (Exception: Dizziness caused by heat exposure, sudden standing, or poor fluid intake.)  Answer Assessment - Initial Assessment Questions 1. DESCRIPTION: Describe your dizziness.     Feels unbalanced  2. LIGHTHEADED: Do you feel lightheaded? (e.g., somewhat faint, woozy, weak upon standing)     Yes, when bending over  3. VERTIGO: Do you feel like either you or the room is spinning or tilting? (i.e., vertigo)     No 4. SEVERITY: How bad is it?  Do you feel like you are going to faint? Can you stand and walk?     Moderate to severe, symptoms intermittent  5. ONSET:  When did the dizziness begin?     3 days ago 6. AGGRAVATING FACTORS: Does anything make it worse? (e.g., standing, change in head position)      Unsure  7. HEART RATE: Can you tell me your heart rate? How many beats in 15 seconds?  (Note: Not all patients can do this.)       N/A 8. CAUSE: What do you think is causing the dizziness? (e.g., decreased fluids or food, diarrhea, emotional distress, heat exposure, new medicine, sudden standing, vomiting; unknown)     Unsure 9. RECURRENT SYMPTOM: Have you had dizziness before? If Yes, ask: When was the last time? What happened that time?     Had similar symptoms about a month ago 10. OTHER SYMPTOMS: Do you have any other symptoms? (e.g., fever, chest pain, vomiting, diarrhea, bleeding)       No  Protocols used: Dizziness - Lightheadedness-A-AH

## 2024-09-23 NOTE — Telephone Encounter (Signed)
 Noted  Pt has appt.  KP

## 2024-09-23 NOTE — Telephone Encounter (Signed)
 1st attempt. No answer, left voicemail for patient to return call for nurse triage.     Copied from CRM #[id number]. Topic: Clinical - Refused Triage >> Sep 23, 2024 11:59 AM Myrick T wrote: Patient/caller voiced complaints of being out of town the weekend and Friday when she was coming off the train she stood up and flopped back down on a the as she feels her knee just gave out. She said the same day she bent over and became very dizzy thought she would just tumble all the way over. She felt unbalanced all weekend and wants to know what she needs to do   Declined transfer to triage as she was waiting on a call from another doctors office. Patient said she would call back.

## 2024-09-23 NOTE — Telephone Encounter (Signed)
 Copied from CRM #[id number]. Topic: Clinical - Refused Triage >> Sep 23, 2024 11:59 AM Myrick T wrote: Patient/caller voiced complaints of being out of town the weekend and Friday when she was coming off the train she stood up and flopped back down on a the as she feels her knee just gave out. She said the same day she bent over and became very dizzy thought she would just tumble all the way over. She felt unbalanced all weekend and wants to know what she needs to do  Declined transfer to triage as she was waiting on a call from another doctors office. Patient said she would call back.

## 2024-09-24 ENCOUNTER — Encounter: Payer: Self-pay | Admitting: Internal Medicine

## 2024-09-24 ENCOUNTER — Ambulatory Visit (INDEPENDENT_AMBULATORY_CARE_PROVIDER_SITE_OTHER): Admitting: Internal Medicine

## 2024-09-24 VITALS — BP 122/78 | HR 67 | Ht 62.5 in | Wt 168.0 lb

## 2024-09-24 DIAGNOSIS — E118 Type 2 diabetes mellitus with unspecified complications: Secondary | ICD-10-CM

## 2024-09-24 DIAGNOSIS — R413 Other amnesia: Secondary | ICD-10-CM | POA: Insufficient documentation

## 2024-09-24 DIAGNOSIS — Z7984 Long term (current) use of oral hypoglycemic drugs: Secondary | ICD-10-CM

## 2024-09-24 DIAGNOSIS — I10 Essential (primary) hypertension: Secondary | ICD-10-CM | POA: Diagnosis not present

## 2024-09-24 DIAGNOSIS — R269 Unspecified abnormalities of gait and mobility: Secondary | ICD-10-CM

## 2024-09-24 NOTE — Assessment & Plan Note (Signed)
 Well controlled blood pressure today. Current regimen is Telmisartan , hydrochlorothiazide, amlodipine  and Coreg . No medication side effects noted.

## 2024-09-24 NOTE — Assessment & Plan Note (Signed)
  Currently medications are Januvia .  No hypoglycemic episodes noted. Home blood sugars in the unknown range. Last visit medical regimen changes were none. Lab Results  Component Value Date   HGBA1C 7.6 (H) 02/26/2024

## 2024-09-24 NOTE — Progress Notes (Addendum)
 Date:  09/24/2024   Name:  Samantha Lang   DOB:  1941/03/11   MRN:  [id number]   Chief Complaint: Dizziness (Dizziness on and off for 3 months. Changing positions causes dizziness. Frequent falls. ) and Memory Loss (6CIT complete and worse than 7 months ago. Scored a 12.)  Diabetes She presents for her follow-up diabetic visit. She has type 2 diabetes mellitus. Her disease course has been stable. Hypoglycemia symptoms include confusion. Pertinent negatives for hypoglycemia include no dizziness, headaches or nervousness/anxiousness. Associated symptoms include weakness (legs give way at times). Pertinent negatives for diabetes include no chest pain and no fatigue.  Hypertension This is a chronic problem. The problem is controlled. Pertinent negatives include no chest pain, headaches or shortness of breath.   Gait disturbance/balance - gets weak with too much walking, falling forward on stairs, when leaning forward.  No serious injury so far.  No headache, vision changes, speech issues.  All symptoms seemed to worsen about three months ago.  She is very sedentary and does not exercise.  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  Drinking 3-4 vodka drinks per night - much more than previously.    Review of Systems  Constitutional:  Negative for chills, fatigue, fever and unexpected weight change.  HENT:  Negative for trouble swallowing.   Respiratory:  Negative for chest tightness and shortness of breath.   Cardiovascular:  Negative for chest pain and leg swelling.  Genitourinary:  Negative for dysuria, frequency and urgency.  Musculoskeletal:  Positive for arthralgias and gait problem.  Neurological:  Positive for weakness (legs give way at times). Negative for dizziness, light-headedness and headaches.  Psychiatric/Behavioral:  Positive for confusion. Negative for dysphoric mood and sleep disturbance. The patient is not nervous/anxious.       Lab Results  Component Value Date   NA 139 02/26/2024   K 3.6 02/26/2024   CO2 27 02/26/2024   GLUCOSE 151 (H) 02/26/2024   BUN 25 02/26/2024   CREATININE 0.84 02/26/2024   CALCIUM  9.2 02/26/2024   EGFR 70 02/26/2024   GFRNONAA 54 (L) 11/23/2020   Lab Results  Component Value Date   CHOL 177 02/26/2024   HDL 65 02/26/2024   LDLCALC 74 02/26/2024   LDLDIRECT 138.3 09/26/2011   TRIG 235 (H) 02/26/2024   CHOLHDL 2.7 02/26/2024   Lab Results  Component Value Date   TSH 3.050 02/26/2024   Lab Results  Component Value Date   HGBA1C 7.6 (H) 02/26/2024   Lab Results  Component Value Date   WBC 6.2 02/26/2024   HGB 12.2 02/26/2024   HCT 35.7 02/26/2024   MCV 93 02/26/2024   PLT CANCELED 02/26/2024   Lab Results  Component Value Date   ALT 20 02/26/2024   AST 23 02/26/2024   ALKPHOS 83 02/26/2024   BILITOT 0.4 02/26/2024   No results found for: MARIEN BOLLS, VD25OH   Patient Active Problem List   Diagnosis Date Noted   Memory deficit 09/24/2024   Bilateral hand pain 08/13/2024   Shoulder impingement, left 08/13/2024   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   Nodule of apex of left lung 09/14/2021   Biceps tendinitis of right upper extremity 09/02/2021   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   LVH (left ventricular hypertrophy) due to hypertensive disease, without heart failure 04/11/2019   Ganglion cyst of left foot  08/29/2018   Xerosis of skin 08/29/2018   History of endometrial cancer 08/03/2016   Type II diabetes mellitus with complication (HCC) 03/14/2016   GERD without esophagitis 10/02/2015   Hyperlipidemia associated with type 2 diabetes mellitus (HCC) 04/08/2015   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   Bilateral carotid artery stenosis 03/26/2015   Anxiety    Environmental and seasonal allergies    Postmenopausal atrophic vaginitis    Depression, major,  recurrent, moderate (HCC)    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 marcos, eyelid lift   POLYPECTOMY  09/22/2017   Procedure: POLYPECTOMY;  Surgeon: Jinny Carmine, MD;  [facility];  Service: Gastroenterology;;   TUBAL LIGATION      Social History   Tobacco Use   Smoking status: Former    Current packs/day: 0.00    Average packs/day: 0.3 packs/day for 20.0 years (5.0 ttl pk-yrs)    Types: Cigarettes    Start date: 09/25/1968    Quit date: 09/25/1988    Years since quitting: 36.0   Smokeless tobacco: Never  Vaping Use   Vaping status: Never Used  Substance Use Topics   Alcohol use: Yes    Alcohol/week: 6.0 standard drinks of alcohol    Types: 2 Glasses of wine, 2 Cans of beer, 2 Shots of liquor per week    Comment: 2 times a day. 14 a week   Drug use: Never     Medication list has been reviewed and updated.  Current Meds  Medication Sig   allopurinol  (ZYLOPRIM ) 100 MG tablet TAKE (1) TABLET BY MOUTH EVERY DAY   amLODipine  (NORVASC ) 5 MG tablet TAKE ONE (1) TABLET BY MOUTH TWO (2) TIMES DAILY.   aspirin 81 MG tablet Take 81 mg by mouth daily.   atorvastatin  (LIPITOR) 20 MG tablet TAKE (1) TABLET BY MOUTH EVERY DAY   carvedilol  (COREG ) 12.5 MG tablet TAKE (1) TABLET BY MOUTH TWICE DAILY   DULoxetine (CYMBALTA) 60 MG capsule Take 60 mg by mouth daily.   hydrocortisone  (ANUSOL -HC) 2.5 % rectal cream Place 1 Application rectally 2 (two) times daily.    meloxicam  (MOBIC ) 7.5 MG tablet Take 1 tablet (7.5 mg total) by mouth daily. X 1 week then daily PRN   Multiple Vitamin (MULTIVITAMIN) capsule Take 1 capsule by mouth daily.   pantoprazole  (PROTONIX ) 40 MG tablet TAKE ONE (1) TABLET BY MOUTH ONCE DAILY   sitaGLIPtin  (JANUVIA ) 100 MG tablet TAKE ONE (1) TABLET (100 MG TOTAL) BY MOUTH DAILY.   telmisartan -hydrochlorothiazide (MICARDIS  HCT) 80-25 MG tablet TAKE (1) TABLET BY MOUTH EVERY DAY   triamcinolone  cream (KENALOG ) 0.1 % Apply 1 Application topically 2 (two) times daily.   VITAMIN D PO Take 5,000 Units by mouth daily.   [DISCONTINUED] venlafaxine  XR (EFFEXOR -XR) 37.5 MG 24 hr capsule Take 37.5 mg by mouth daily.   [DISCONTINUED] venlafaxine  XR (EFFEXOR -XR) 75 MG 24 hr capsule Take 75 mg by mouth daily.  09/24/2024   11:35 AM 05/31/2024   10:52 AM 02/06/2024   10:32 AM 11/14/2023   10:26 AM  GAD 7 : Generalized Anxiety Score  Nervous, Anxious, on Edge 2 3 0 1  Control/stop worrying 2 2 0 1  Worry too much - different things 2 2 0 1  Trouble relaxing 2 3 0 1  Restless 2 2 0 1  Easily annoyed or irritable 2 2 0 1  Afraid - awful might happen 2 2 0 1  Total GAD 7 Score 14 16 0 7  Anxiety Difficulty Very difficult Somewhat difficult Not difficult at all Somewhat difficult       09/24/2024   11:32 AM 05/31/2024   10:52 AM 02/21/2024    3:22 PM  Depression screen PHQ 2/9  Decreased Interest 3 2 3   Down, Depressed, Hopeless 3 2 3   PHQ - 2 Score 6 4 6   Altered sleeping 0 3 0  Tired, decreased energy 2 2 0  Change in appetite 2 2 0  Feeling bad or failure about yourself  2 2 0  Trouble concentrating 3 2 0  Moving slowly or fidgety/restless 3 2 0  Suicidal thoughts 0 2 0  PHQ-9 Score 18 19 6   Difficult doing work/chores Very difficult Somewhat difficult Not difficult at all      09/24/2024   11:35 AM 02/21/2024    3:29 PM 02/16/2023   11:08 AM 01/28/2019   10:49 AM 11/13/2017    3:56 PM  6CIT Screen  What Year? 4 points 4  points 0 points 0 points 0 points  What month? 0 points 0 points 0 points 0 points 0 points  What time? 0 points 0 points 0 points 0 points 3 points  Count back from 20 0 points 0 points 0 points 0 points 0 points  Months in reverse 2 points  0 points 0 points 0 points  Repeat phrase 6 points 6 points 4 points 4 points 4 points  Total Score 12 points  4 points 4 points 7 points      BP Readings from Last 3 Encounters:  09/24/24 122/78  08/13/24 112/60  05/31/24 128/76    Physical Exam Constitutional:      Appearance: Normal appearance.  Neck:     Vascular: No carotid bruit.  Cardiovascular:     Rate and Rhythm: Normal rate and regular rhythm.     Heart sounds: No murmur heard. Pulmonary:     Effort: Pulmonary effort is normal.     Breath sounds: No wheezing or rhonchi.  Abdominal:     General: Abdomen is flat.     Palpations: Abdomen is soft.  Musculoskeletal:     Cervical back: Normal range of motion.     Right lower leg: No edema.     Left lower leg: No edema.  Lymphadenopathy:     Cervical: No cervical adenopathy.  Neurological:     Mental Status: She is confused.     Cranial Nerves: Cranial nerves 2-12 are intact. No dysarthria or facial asymmetry.     Sensory: Sensation is intact. No sensory deficit.     Motor: Motor function is intact.     Deep Tendon Reflexes:     Reflex Scores:      Patellar reflexes are 1+ on the right side and 1+ on the left side.      Achilles reflexes are 1+ on the right side and 1+ on the left side.    Comments:  Gait length is short and shuffling, slightly mildly unsteady     Wt Readings from Last 3 Encounters:  09/24/24 168 lb (76.2 kg)  08/13/24 168 lb (76.2 kg)  05/31/24 167 lb 4 oz (75.9 kg)    BP 122/78   Pulse 67   Ht 5' 2.5 (1.588 m)   Wt 168 lb (76.2 kg)   SpO2 98%   BMI 30.24 kg/m   Assessment and Plan:  Problem List Items Addressed This Visit       Unprioritized   Essential hypertension (Chronic)   Well  controlled blood pressure today. Current regimen is Telmisartan , hydrochlorothiazide, amlodipine  and Coreg . No medication side effects noted.        Relevant Orders   Comprehensive metabolic panel with GFR   CBC with Differential/Platelet   Memory deficit   Clearing declining since last. She does not drive, has support from her husband who is also retired Will check some labs Refer to Neurology.      Relevant Orders   Sedimentation rate   TSH   Vitamin B12   CT HEAD WO CONTRAST ( )   Ambulatory referral to Neurology   Type II diabetes mellitus with complication (HCC) (Chronic)    Currently medications are Januvia .  No hypoglycemic episodes noted. Home blood sugars in the unknown range. Last visit medical regimen changes were none. Lab Results  Component Value Date   HGBA1C 7.6 (H) 02/26/2024          Relevant Orders   Comprehensive metabolic panel with GFR   Hemoglobin A1c   Other Visit Diagnoses       Gait disturbance    -  Primary   need to rule out intracranial abnormality if no acute process, will refer to PTx   Relevant Orders   CT HEAD WO CONTRAST ( )   Ambulatory referral to Neurology     Long term current use of oral hypoglycemic drug           No follow-ups on file.    Leita HILARIO Adie, MD [facility] [place]

## 2024-09-24 NOTE — Assessment & Plan Note (Signed)
 Clearing declining since last. She does not drive, has support from her husband who is also retired Will check some labs Refer to Neurology.

## 2024-09-25 ENCOUNTER — Other Ambulatory Visit: Payer: Self-pay | Admitting: Internal Medicine

## 2024-09-25 ENCOUNTER — Ambulatory Visit: Payer: Self-pay | Admitting: Internal Medicine

## 2024-09-25 DIAGNOSIS — E876 Hypokalemia: Secondary | ICD-10-CM

## 2024-09-25 LAB — CBC WITH DIFFERENTIAL/PLATELET
Basophils Absolute: 0.1 x10E3/uL (ref 0.0–0.2)
Basos: 1 %
EOS (ABSOLUTE): 0.7 x10E3/uL — ABNORMAL HIGH (ref 0.0–0.4)
Eos: 9 %
Hematocrit: 37 % (ref 34.0–46.6)
Hemoglobin: 12.7 g/dL (ref 11.1–15.9)
Immature Grans (Abs): 0 x10E3/uL (ref 0.0–0.1)
Immature Granulocytes: 0 %
Lymphocytes Absolute: 2.2 x10E3/uL (ref 0.7–3.1)
Lymphs: 30 %
MCH: 32.2 pg (ref 26.6–33.0)
MCHC: 34.3 g/dL (ref 31.5–35.7)
MCV: 94 fL (ref 79–97)
Monocytes Absolute: 0.6 x10E3/uL (ref 0.1–0.9)
Monocytes: 8 %
Neutrophils Absolute: 3.9 x10E3/uL (ref 1.4–7.0)
Neutrophils: 52 %
RBC: 3.95 x10E6/uL (ref 3.77–5.28)
RDW: 12.6 % (ref 11.7–15.4)
WBC: 7.4 x10E3/uL (ref 3.4–10.8)

## 2024-09-25 LAB — HEMOGLOBIN A1C
Est. average glucose Bld gHb Est-mCnc: 180 mg/dL
Hgb A1c MFr Bld: 7.9 % — ABNORMAL HIGH (ref 4.8–5.6)

## 2024-09-25 LAB — VITAMIN B12: Vitamin B-12: 369 pg/mL (ref 232–1245)

## 2024-09-25 LAB — COMPREHENSIVE METABOLIC PANEL WITH GFR
ALT: 19 IU/L (ref 0–32)
AST: 20 IU/L (ref 0–40)
Albumin: 4.1 g/dL (ref 3.7–4.7)
Alkaline Phosphatase: 83 IU/L (ref 48–129)
BUN/Creatinine Ratio: 23 (ref 12–28)
BUN: 22 mg/dL (ref 8–27)
Bilirubin Total: 0.7 mg/dL (ref 0.0–1.2)
CO2: 26 mmol/L (ref 20–29)
Calcium: 9.5 mg/dL (ref 8.7–10.3)
Chloride: 92 mmol/L — ABNORMAL LOW (ref 96–106)
Creatinine, Ser: 0.96 mg/dL (ref 0.57–1.00)
Globulin, Total: 2.5 g/dL (ref 1.5–4.5)
Glucose: 177 mg/dL — ABNORMAL HIGH (ref 70–99)
Potassium: 3.3 mmol/L — ABNORMAL LOW (ref 3.5–5.2)
Sodium: 137 mmol/L (ref 134–144)
Total Protein: 6.6 g/dL (ref 6.0–8.5)
eGFR: 59 mL/min/1.73 — ABNORMAL LOW (ref >59)

## 2024-09-25 LAB — SEDIMENTATION RATE: Sed Rate: 18 mm/h (ref 0–40)

## 2024-09-25 LAB — TSH: TSH: 3.95 u[IU]/mL (ref 0.450–4.500)

## 2024-09-25 MED ORDER — POTASSIUM CHLORIDE CRYS ER 10 MEQ PO TBCR: 10.0000 meq | EXTENDED_RELEASE_TABLET | Freq: Every day | ORAL | 0 refills | Status: AC

## 2024-09-25 NOTE — Progress Notes (Unsigned)
 Date:  09/25/2024   Name:  Samantha Lang   DOB:  1941/07/13   MRN:  [id number]   Chief Complaint: No chief complaint on file.  HPI  Review of Systems   Lab Results  Component Value Date   NA 137 09/24/2024   K 3.3 (L) 09/24/2024   CO2 26 09/24/2024   GLUCOSE 177 (H) 09/24/2024   BUN 22 09/24/2024   CREATININE 0.96 09/24/2024   CALCIUM  9.5 09/24/2024   EGFR 59 (L) 09/24/2024   GFRNONAA 54 (L) 11/23/2020   Lab Results  Component Value Date   CHOL 177 02/26/2024   HDL 65 02/26/2024   LDLCALC 74 02/26/2024   LDLDIRECT 138.3 09/26/2011   TRIG 235 (H) 02/26/2024   CHOLHDL 2.7 02/26/2024   Lab Results  Component Value Date   TSH 3.950 09/24/2024   Lab Results  Component Value Date   HGBA1C 7.9 (H) 09/24/2024   Lab Results  Component Value Date   WBC 7.4 09/24/2024   HGB 12.7 09/24/2024   HCT 37.0 09/24/2024   MCV 94 09/24/2024   PLT CANCELED 09/24/2024   Lab Results  Component Value Date   ALT 19 09/24/2024   AST 20 09/24/2024   ALKPHOS 83 09/24/2024   BILITOT 0.7 09/24/2024   No results found for: MARIEN BOLLS, VD25OH   Patient Active Problem List   Diagnosis Date Noted   Memory deficit 09/24/2024   Bilateral hand pain 08/13/2024   Shoulder impingement, left 08/13/2024   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   Nodule of apex of left lung 09/14/2021   Biceps tendinitis of right upper extremity 09/02/2021   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   LVH (left ventricular hypertrophy) due to hypertensive disease, without heart failure 04/11/2019   Ganglion cyst of left foot 08/29/2018   Xerosis of skin 08/29/2018   History of endometrial cancer 08/03/2016   Type II diabetes mellitus with complication (HCC) 03/14/2016   GERD without esophagitis 10/02/2015   Hyperlipidemia associated with type 2 diabetes mellitus (HCC) 04/08/2015   Aortic valve sclerosis  03/26/2015   Bilateral carotid artery stenosis 03/26/2015   Anxiety    Environmental and seasonal allergies    Postmenopausal atrophic vaginitis    Depression, major, recurrent, moderate (HCC)    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 marcos, eyelid lift   POLYPECTOMY  09/22/2017   Procedure: POLYPECTOMY;  Surgeon: Jinny Carmine, MD;  Location: [facility];  Service: Gastroenterology;;   TUBAL LIGATION      Social History   Tobacco Use   Smoking status: Former    Current packs/day: 0.00    Average packs/day: 0.3 packs/day for 20.0 years (5.0 ttl pk-yrs)    Types: Cigarettes    Start date: 09/25/1968    Quit date: 09/25/1988    Years since quitting: 36.0   Smokeless tobacco: Never  Vaping Use   Vaping status: Never Used  Substance Use Topics   Alcohol use: Yes    Alcohol/week: 6.0 standard drinks of alcohol    Types: 2 Glasses of wine, 2 Cans of beer, 2 Shots of liquor per week    Comment: 2 times a day. 14 a week   Drug use: Never     Medication list has been reviewed and updated.  No outpatient medications have been marked as taking for the 09/25/24 encounter (Orders Only) with Justus Leita DEL, MD.       09/24/2024   11:35 AM 05/31/2024   10:52 AM 02/06/2024   10:32 AM 11/14/2023   10:26 AM  GAD 7 : Generalized Anxiety Score  Nervous, Anxious, on Edge 2 3 0 1  Control/stop worrying 2 2 0 1  Worry too much - different things 2 2 0 1  Trouble relaxing 2  3 0 1  Restless 2 2 0 1  Easily annoyed or irritable 2 2 0 1  Afraid - awful might happen 2 2 0 1  Total GAD 7 Score 14 16 0 7  Anxiety Difficulty Very difficult Somewhat difficult Not difficult at all Somewhat difficult       09/24/2024   11:32 AM 05/31/2024   10:52 AM 02/21/2024    3:22 PM  Depression screen PHQ 2/9  Decreased Interest 3 2 3   Down, Depressed, Hopeless 3 2 3   PHQ - 2 Score 6 4 6   Altered sleeping 0 3 0  Tired, decreased energy 2 2 0  Change in appetite 2 2 0  Feeling bad or failure about yourself  2 2 0  Trouble concentrating 3 2 0  Moving slowly or fidgety/restless 3 2 0  Suicidal thoughts 0 2 0  PHQ-9 Score 18 19 6   Difficult doing work/chores Very difficult Somewhat difficult Not difficult at all    BP Readings from Last 3 Encounters:  09/24/24 122/78  08/13/24 112/60  05/31/24 128/76    Physical Exam  Wt Readings from Last 3 Encounters:  09/24/24 168 lb (76.2 kg)  08/13/24 168 lb (76.2 kg)  05/31/24 167 lb 4 oz (75.9 kg)    There were no vitals taken for this visit.  Assessment and Plan:  Problem List Items Addressed This Visit   None   No follow-ups on file.    Leita HILARIO Justus, MD [facility] [place]

## 2024-10-01 ENCOUNTER — Ambulatory Visit
Admission: RE | Admit: 2024-10-01 | Discharge: 2024-10-01 | Disposition: A | Discharge status: Home / Self Care | Source: Ambulatory Visit | Attending: Internal Medicine | Admitting: Internal Medicine

## 2024-10-01 DIAGNOSIS — R413 Other amnesia: Secondary | ICD-10-CM | POA: Insufficient documentation

## 2024-10-01 DIAGNOSIS — R269 Unspecified abnormalities of gait and mobility: Secondary | ICD-10-CM | POA: Diagnosis not present

## 2024-10-01 DIAGNOSIS — R270 Ataxia, unspecified: Secondary | ICD-10-CM | POA: Diagnosis not present

## 2024-10-01 DIAGNOSIS — R2689 Other abnormalities of gait and mobility: Secondary | ICD-10-CM | POA: Diagnosis not present

## 2024-10-03 ENCOUNTER — Telehealth: Payer: Self-pay

## 2024-10-03 NOTE — Telephone Encounter (Signed)
 Copied from CRM #[id number]. Topic: Clinical - Prescription Issue >> Oct 03, 2024 11:28 AM Ivette P wrote: Reason for CRM: pt sees a mental health NP and she is unable to schedule an appt and the NP is no longer at the practice she goes.   Pt would like to know if primary can sign of on her anti depressant medication   DULoxetine (CYMBALTA) 60 MG capsule   Callback [phone]

## 2024-10-04 DIAGNOSIS — F331 Major depressive disorder, recurrent, moderate: Secondary | ICD-10-CM | POA: Diagnosis not present

## 2024-10-04 DIAGNOSIS — F411 Generalized anxiety disorder: Secondary | ICD-10-CM | POA: Diagnosis not present

## 2024-10-04 DIAGNOSIS — F1090 Alcohol use, unspecified, uncomplicated: Secondary | ICD-10-CM | POA: Diagnosis not present

## 2024-10-11 DIAGNOSIS — F4322 Adjustment disorder with anxiety: Secondary | ICD-10-CM | POA: Diagnosis not present

## 2024-10-21 ENCOUNTER — Telehealth: Payer: Self-pay

## 2024-10-21 DIAGNOSIS — F1090 Alcohol use, unspecified, uncomplicated: Secondary | ICD-10-CM | POA: Diagnosis not present

## 2024-10-21 DIAGNOSIS — F411 Generalized anxiety disorder: Secondary | ICD-10-CM | POA: Diagnosis not present

## 2024-10-21 DIAGNOSIS — F331 Major depressive disorder, recurrent, moderate: Secondary | ICD-10-CM | POA: Diagnosis not present

## 2024-10-21 NOTE — Telephone Encounter (Signed)
 Tried to call pt. Unable to reach at this time. Mailed lab results to pt.

## 2024-10-21 NOTE — Telephone Encounter (Signed)
 Copied from CRM [id number]. Topic: Clinical - Lab/Test Results >> Oct 21, 2024  4:09 PM Shanda MATSU wrote: Reason for CRM: Patient is calling in req to speak with Dilana Mcphie, is req a copy of her labs.

## 2024-10-21 NOTE — Progress Notes (Signed)
 Samantha Lang                                          MRN: [id number]   10/21/2024   The [facility]BCI Quality Team Specialist reviewed this patient medical record for the purposes of chart review for care gap closure. The following were reviewed: chart review for care gap closure-kidney health evaluation for diabetes:eGFR  and uACR.    VBCI Quality Team

## 2024-10-29 ENCOUNTER — Other Ambulatory Visit: Payer: Self-pay | Admitting: Internal Medicine

## 2024-10-29 DIAGNOSIS — M10079 Idiopathic gout, unspecified ankle and foot: Secondary | ICD-10-CM

## 2024-10-30 NOTE — Telephone Encounter (Signed)
 Requested Prescriptions  Pending Prescriptions Disp Refills   allopurinol  (ZYLOPRIM ) 100 MG tablet [Pharmacy Med Name: ALLOPURINOL  100MG  TABLET] 90 tablet 0    Sig: TAKE (1) TABLET BY MOUTH EVERY DAY     Endocrinology:  Gout Agents - allopurinol  Passed - 10/30/2024  1:32 PM      Passed - Uric Acid in normal range and within 360 days    Uric Acid  Date Value Ref Range Status  02/26/2024 7.0 3.1 - 7.9 mg/dL Final    Comment:               Therapeutic target for gout patients: <6.0         Passed - Cr in normal range and within 360 days    Creatinine, Ser  Date Value Ref Range Status  09/24/2024 0.96 0.57 - 1.00 mg/dL Final         Passed - Valid encounter within last 12 months    Recent Outpatient Visits           1 month ago Gait disturbance   [place] [facility] at [facility], Leita DEL, MD   2 months ago Bilateral hand pain   [place] [facility] at [facility], Selinda PARAS, MD   5 months ago Irritable bowel syndrome with both constipation and diarrhea   [place] [facility] at [facility], Leita DEL, MD   8 months ago Annual physical exam   [facility] at [facility], Leita DEL, MD              Passed - CBC within normal limits and completed in the last 12 months    WBC  Date Value Ref Range Status  09/24/2024 7.4 3.4 - 10.8 x10E3/uL Final   RBC  Date Value Ref Range Status  09/24/2024 3.95 3.77 - 5.28 x10E6/uL Final    Comment:    Polychromasia present   Hemoglobin  Date Value Ref Range Status  09/24/2024 12.7 11.1 - 15.9 g/dL Final   Hematocrit  Date Value Ref Range Status  09/24/2024 37.0 34.0 - 46.6 % Final   MCHC  Date Value Ref Range Status  09/24/2024 34.3 31.5 - 35.7 g/dL Final   [facility]  Date Value Ref Range Status  09/24/2024 32.2 26.6 - 33.0 pg Final   MCV  Date Value Ref Range Status  09/24/2024 94  79 - 97 fL Final   No results found for: PLTCOUNTKUC, LABPLAT, POCPLA RDW  Date Value Ref Range Status  09/24/2024 12.6 11.7 - 15.4 % Final

## 2024-11-06 DIAGNOSIS — F4322 Adjustment disorder with anxiety: Secondary | ICD-10-CM | POA: Diagnosis not present

## 2024-11-06 DIAGNOSIS — F330 Major depressive disorder, recurrent, mild: Secondary | ICD-10-CM | POA: Diagnosis not present

## 2024-11-07 ENCOUNTER — Encounter: Payer: Self-pay | Admitting: Internal Medicine

## 2024-11-07 ENCOUNTER — Other Ambulatory Visit: Payer: Self-pay | Admitting: Internal Medicine

## 2024-11-07 ENCOUNTER — Other Ambulatory Visit: Payer: Self-pay | Admitting: Family Medicine

## 2024-11-07 DIAGNOSIS — E118 Type 2 diabetes mellitus with unspecified complications: Secondary | ICD-10-CM

## 2024-11-07 DIAGNOSIS — M79641 Pain in right hand: Secondary | ICD-10-CM

## 2024-11-07 DIAGNOSIS — M25812 Other specified joint disorders, left shoulder: Secondary | ICD-10-CM

## 2024-11-07 DIAGNOSIS — R269 Unspecified abnormalities of gait and mobility: Secondary | ICD-10-CM

## 2024-11-07 NOTE — Progress Notes (Unsigned)
 Date:  11/07/2024   Name:  Samantha Lang   DOB:  November 25, 1941   MRN:  [id number]   Chief Complaint: No chief complaint on file.  HPI  Review of Systems   Lab Results  Component Value Date   NA 137 09/24/2024   K 3.3 (L) 09/24/2024   CO2 26 09/24/2024   GLUCOSE 177 (H) 09/24/2024   BUN 22 09/24/2024   CREATININE 0.96 09/24/2024   CALCIUM  9.5 09/24/2024   EGFR 59 (L) 09/24/2024   GFRNONAA 54 (L) 11/23/2020   Lab Results  Component Value Date   CHOL 177 02/26/2024   HDL 65 02/26/2024   LDLCALC 74 02/26/2024   LDLDIRECT 138.3 09/26/2011   TRIG 235 (H) 02/26/2024   CHOLHDL 2.7 02/26/2024   Lab Results  Component Value Date   TSH 3.950 09/24/2024   Lab Results  Component Value Date   HGBA1C 7.9 (H) 09/24/2024   Lab Results  Component Value Date   WBC 7.4 09/24/2024   HGB 12.7 09/24/2024   HCT 37.0 09/24/2024   MCV 94 09/24/2024   PLT CANCELED 09/24/2024   Lab Results  Component Value Date   ALT 19 09/24/2024   AST 20 09/24/2024   ALKPHOS 83 09/24/2024   BILITOT 0.7 09/24/2024   No results found for: MARIEN BOLLS, VD25OH   Patient Active Problem List   Diagnosis Date Noted   Memory deficit 09/24/2024   Bilateral hand pain 08/13/2024   Shoulder impingement, left 08/13/2024   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   Nodule of apex of left lung 09/14/2021   Biceps tendinitis of right upper extremity 09/02/2021   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   LVH (left ventricular hypertrophy) due to hypertensive disease, without heart failure 04/11/2019   Ganglion cyst of left foot 08/29/2018   Xerosis of skin 08/29/2018   History of endometrial cancer 08/03/2016   Type II diabetes mellitus with complication (HCC) 03/14/2016   GERD without esophagitis 10/02/2015   Hyperlipidemia associated with type 2 diabetes mellitus (HCC) 04/08/2015   Aortic valve sclerosis  03/26/2015   Bilateral carotid artery stenosis 03/26/2015   Anxiety    Environmental and seasonal allergies    Postmenopausal atrophic vaginitis    Depression, major, recurrent, moderate (HCC)    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 marcos, eyelid lift   POLYPECTOMY  09/22/2017   Procedure: POLYPECTOMY;  Surgeon: Jinny Carmine, MD;  Location: [facility];  Service: Gastroenterology;;   TUBAL LIGATION      Social History   Tobacco Use   Smoking status: Former    Current packs/day: 0.00    Average packs/day: 0.3 packs/day for 20.0 years (5.0 ttl pk-yrs)    Types: Cigarettes    Start date: 09/25/1968    Quit date: 09/25/1988    Years since quitting: 36.1   Smokeless tobacco: Never  Vaping Use   Vaping status: Never Used  Substance Use Topics   Alcohol use: Yes    Alcohol/week: 6.0 standard drinks of alcohol    Types: 2 Glasses of wine, 2 Cans of beer, 2 Shots of liquor per week    Comment: 2 times a day. 14 a week   Drug use: Never     Medication list has been reviewed and updated.  No outpatient medications have been marked as taking for the 11/07/24 encounter (Orders Only) with Justus Leita DEL, MD.       09/24/2024   11:35 AM 05/31/2024   10:52 AM 02/06/2024   10:32 AM 11/14/2023   10:26 AM  GAD 7 : Generalized Anxiety Score  Nervous, Anxious, on Edge 2 3 0 1  Control/stop worrying 2 2 0 1  Worry too much - different things 2 2 0 1  Trouble relaxing 2  3 0 1  Restless 2 2 0 1  Easily annoyed or irritable 2 2 0 1  Afraid - awful might happen 2 2 0 1  Total GAD 7 Score 14 16 0 7  Anxiety Difficulty Very difficult Somewhat difficult Not difficult at all Somewhat difficult       09/24/2024   11:32 AM 05/31/2024   10:52 AM 02/21/2024    3:22 PM  Depression screen PHQ 2/9  Decreased Interest 3 2 3   Down, Depressed, Hopeless 3 2 3   PHQ - 2 Score 6 4 6   Altered sleeping 0 3 0  Tired, decreased energy 2 2 0  Change in appetite 2 2 0  Feeling bad or failure about yourself  2 2 0  Trouble concentrating 3 2 0  Moving slowly or fidgety/restless 3 2 0  Suicidal thoughts 0 2 0  PHQ-9 Score 18  19  6    Difficult doing work/chores Very difficult Somewhat difficult Not difficult at all     Data saved with a previous flowsheet row definition    BP Readings from Last 3 Encounters:  09/24/24 122/78  08/13/24 112/60  05/31/24 128/76    Physical Exam  Wt Readings from Last 3 Encounters:  09/24/24 168 lb (76.2 kg)  08/13/24 168 lb (76.2 kg)  05/31/24 167 lb 4 oz (75.9 kg)    There were no vitals taken for this visit.  Assessment and Plan:  Problem List Items Addressed This Visit   None   No follow-ups on file.    Leita HILARIO Justus, MD [facility] [place]

## 2024-11-09 NOTE — Telephone Encounter (Signed)
 Requested Prescriptions  Pending Prescriptions Disp Refills   meloxicam  (MOBIC ) 7.5 MG tablet [Pharmacy Med Name: MELOXICAM  7.5MG  TABLET] 90 tablet 0    Sig: TAKE ONE (1) TABLET BY MOUTH ONCE DAILY FOR ONE (1) WEEK, THEN ONE (1) TABLET BY MOUTH DAILY AS NEEDED     Analgesics:  COX2 Inhibitors Failed - 11/09/2024  9:14 AM      Failed - Manual Review: Labs are only required if the patient has taken medication for more than 8 weeks.      Passed - HGB in normal range and within 360 days    Hemoglobin  Date Value Ref Range Status  09/24/2024 12.7 11.1 - 15.9 g/dL Final         Passed - Cr in normal range and within 360 days    Creatinine, Ser  Date Value Ref Range Status  09/24/2024 0.96 0.57 - 1.00 mg/dL Final         Passed - HCT in normal range and within 360 days    Hematocrit  Date Value Ref Range Status  09/24/2024 37.0 34.0 - 46.6 % Final         Passed - AST in normal range and within 360 days    AST  Date Value Ref Range Status  09/24/2024 20 0 - 40 IU/L Final         Passed - ALT in normal range and within 360 days    ALT  Date Value Ref Range Status  09/24/2024 19 0 - 32 IU/L Final         Passed - eGFR is 30 or above and within 360 days    GFR calc Af Amer  Date Value Ref Range Status  11/23/2020 62 >59 mL/min/1.73 Final    Comment:    **In accordance with recommendations from the NKF-ASN Task force,**   [facility] is in the process of updating its eGFR calculation to the   2021 CKD-EPI creatinine equation that estimates kidney function   without a race variable.    GFR calc non Af Amer  Date Value Ref Range Status  11/23/2020 54 (L) >59 mL/min/1.73 Final   GFR  Date Value Ref Range Status  09/26/2011 81.47 >60.00 mL/min Final   eGFR  Date Value Ref Range Status  09/24/2024 59 (L) >59 mL/min/1.73 Final         Passed - Patient is not pregnant      Passed - Valid encounter within last 12 months    Recent Outpatient Visits           1 month ago  Gait disturbance   [place] [facility] at [facility], Leita DEL, MD   2 months ago Bilateral hand pain   [place] [facility] at [facility], Selinda PARAS, MD   5 months ago Irritable bowel syndrome with both constipation and diarrhea   [facility] at [facility], Leita DEL, MD   9 months ago Annual physical exam   [facility] at [facility], Leita DEL, MD

## 2024-11-09 NOTE — Telephone Encounter (Signed)
 Requested Prescriptions  Pending Prescriptions Disp Refills   sitaGLIPtin  (JANUVIA ) 100 MG tablet [Pharmacy Med Name: JANUVIA  100MG  TABLET] 90 tablet 0    Sig: TAKE (1) TABLET BY MOUTH EVERY DAY     Endocrinology:  Diabetes - DPP-4 Inhibitors Passed - 11/09/2024  9:17 AM      Passed - HBA1C is between 0 and 7.9 and within 180 days    Hgb A1c MFr Bld  Date Value Ref Range Status  09/24/2024 7.9 (H) 4.8 - 5.6 % Final    Comment:             Prediabetes: 5.7 - 6.4          Diabetes: >6.4          Glycemic control for adults with diabetes: <7.0          Passed - Cr in normal range and within 360 days    Creatinine, Ser  Date Value Ref Range Status  09/24/2024 0.96 0.57 - 1.00 mg/dL Final         Passed - Valid encounter within last 6 months    Recent Outpatient Visits           1 month ago Gait disturbance   [place] [facility] at [facility], Leita DEL, MD   2 months ago Bilateral hand pain   [place] [facility] at [facility] [place], Selinda PARAS, MD   5 months ago Irritable bowel syndrome with both constipation and diarrhea   [facility] at [facility], Leita DEL, MD   9 months ago Annual physical exam   [facility] at [facility], Leita DEL, MD

## 2024-11-11 DIAGNOSIS — F331 Major depressive disorder, recurrent, moderate: Secondary | ICD-10-CM | POA: Diagnosis not present

## 2024-11-11 DIAGNOSIS — F1090 Alcohol use, unspecified, uncomplicated: Secondary | ICD-10-CM | POA: Diagnosis not present

## 2024-11-11 DIAGNOSIS — F411 Generalized anxiety disorder: Secondary | ICD-10-CM | POA: Diagnosis not present

## 2024-11-25 ENCOUNTER — Telehealth: Payer: Self-pay

## 2024-11-25 ENCOUNTER — Telehealth: Payer: Self-pay | Admitting: Internal Medicine

## 2024-11-25 NOTE — Telephone Encounter (Signed)
-----   Message from JOLENE T [facility] sent at 11/22/2024 10:34 AM EST ----- Good morning, this patient is scheduled at new patient on 11/27/24 but they placed her in a 20 minute spot. We need to alert patient and move her to a 40 minute spot please. Thank you.

## 2024-11-25 NOTE — Telephone Encounter (Signed)
 CRM reported to [employer] supervisors to correct patient appointments. Scheduled as office visit rather than as a transfer of care new patient.

## 2024-11-25 NOTE — Telephone Encounter (Signed)
 Called patient to let her know that her new pt appt was scheduled wrong in a 20 min spot, I canceled and rescheduled to Jan 16 at 120pm.

## 2024-11-27 ENCOUNTER — Ambulatory Visit: Admitting: Nurse Practitioner

## 2024-12-06 DIAGNOSIS — F330 Major depressive disorder, recurrent, mild: Secondary | ICD-10-CM | POA: Diagnosis not present

## 2024-12-06 DIAGNOSIS — F4322 Adjustment disorder with anxiety: Secondary | ICD-10-CM | POA: Diagnosis not present

## 2024-12-11 ENCOUNTER — Other Ambulatory Visit: Payer: Self-pay | Admitting: Internal Medicine

## 2024-12-11 DIAGNOSIS — K219 Gastro-esophageal reflux disease without esophagitis: Secondary | ICD-10-CM

## 2024-12-14 NOTE — Telephone Encounter (Signed)
 Requested Prescriptions  Pending Prescriptions Disp Refills   pantoprazole  (PROTONIX ) 40 MG tablet [Pharmacy Med Name: PANTOPRAZOLE  SODIUM 40MG  TABLET DR] 90 tablet 0    Sig: TAKE (1) TABLET BY MOUTH EVERY DAY     Gastroenterology: Proton Pump Inhibitors Passed - 12/14/2024  7:36 AM      Passed - Valid encounter within last 12 months    Recent Outpatient Visits           2 months ago Gait disturbance   [place] [facility] at [facility], Leita DEL, MD   4 months ago Bilateral hand pain   [place] [facility] at [facility], Selinda PARAS, MD   6 months ago Irritable bowel syndrome with both constipation and diarrhea   [place] [facility] at [facility], Leita DEL, MD   10 months ago Annual physical exam   [facility] at [facility], Leita DEL, MD

## 2024-12-16 ENCOUNTER — Telehealth: Payer: Self-pay | Admitting: Pharmacist

## 2024-12-16 NOTE — Progress Notes (Signed)
 This patient is appearing on a report for being at risk of failing the adherence measure for diabetes medications this calendar year.   Medication: Januvia  100 mg  Last fill date: 11/09/2024 for 30 day supply  Left voicemail for patient to return my call at their convenience.  Sharyle Sia, PharmD, [facility] [phone]

## 2024-12-20 ENCOUNTER — Other Ambulatory Visit: Payer: Self-pay | Admitting: Pharmacist

## 2024-12-20 DIAGNOSIS — E118 Type 2 diabetes mellitus with unspecified complications: Secondary | ICD-10-CM

## 2024-12-20 NOTE — Patient Instructions (Signed)
 Please continue using your weekly pillbox to help with taking your medication consistently.   Please consider also using a daily reminder alarm to help with this.   Thank you!  Sharyle Sia, PharmD, [facility] [phone]

## 2024-12-20 NOTE — Progress Notes (Signed)
" ° °  12/20/2024  Patient ID: Samantha Lang, female   DOB: November 29, 1941, 83 y.o.   MRN: [id number]  This patient is appearing on a report for the adherence measure for diabetes medications this calendar year.   Medication: Januvia  100 mg  Last fill date: 11/09/2024 for 30 day supply  Outreach to patient today to discuss medication adherence.   Reports has medication still remaining from previous fill; denies need for refill today.  Reports using weekly pillbox as adherence aid, but admits to missing a dose 1-2 times a week due to forgetting/gets busy doing something else.  Counsel patient on importance of medication adherence/blood sugar control.  Discuss other factors that impact blood sugar control, including limiting carbohydrate portion sizes. Patient reports that this is particularly challenging for her around the holidays  Lab Results  Component Value Date   HGBA1C 7.9 (H) 09/24/2024   HGBA1C 7.6 (H) 02/26/2024   HGBA1C 7.7 (H) 11/14/2023   Encourage patient to continue using weekly pillbox. Encourage to also consider using daily phone alarm and/or support from husband as additional adherence aid.  Patient denies further medication questions or concerns today   Sharyle Sia, PharmD, [facility] [phone] "

## 2025-01-10 ENCOUNTER — Ambulatory Visit: Admitting: Nurse Practitioner

## 2025-02-07 ENCOUNTER — Ambulatory Visit: Admitting: Student

## 2025-02-26 ENCOUNTER — Ambulatory Visit: Payer: Medicare HMO
